# Patient Record
Sex: Female | Born: 1950 | Race: White | Hispanic: No | Marital: Married | Smoking: Former smoker
Health system: Southern US, Community
[De-identification: ages and names within clinical notes are randomized; demographics above are authoritative.]

## PROBLEM LIST (undated history)

## (undated) DIAGNOSIS — M8580 Other specified disorders of bone density and structure, unspecified site: Secondary | ICD-10-CM

## (undated) DIAGNOSIS — K7460 Unspecified cirrhosis of liver: Secondary | ICD-10-CM

## (undated) DIAGNOSIS — Z789 Other specified health status: Secondary | ICD-10-CM

## (undated) DIAGNOSIS — R7303 Prediabetes: Secondary | ICD-10-CM

## (undated) DIAGNOSIS — K219 Gastro-esophageal reflux disease without esophagitis: Secondary | ICD-10-CM

## (undated) DIAGNOSIS — Z780 Asymptomatic menopausal state: Secondary | ICD-10-CM

## (undated) DIAGNOSIS — L309 Dermatitis, unspecified: Secondary | ICD-10-CM

## (undated) DIAGNOSIS — M109 Gout, unspecified: Secondary | ICD-10-CM

## (undated) DIAGNOSIS — F1010 Alcohol abuse, uncomplicated: Secondary | ICD-10-CM

## (undated) DIAGNOSIS — I10 Essential (primary) hypertension: Secondary | ICD-10-CM

## (undated) DIAGNOSIS — G9341 Metabolic encephalopathy: Secondary | ICD-10-CM

## (undated) DIAGNOSIS — R1310 Dysphagia, unspecified: Secondary | ICD-10-CM

## (undated) DIAGNOSIS — E785 Hyperlipidemia, unspecified: Secondary | ICD-10-CM

## (undated) HISTORY — DX: Hyperlipidemia, unspecified: E78.5

## (undated) HISTORY — DX: Gout, unspecified: M10.9

## (undated) HISTORY — DX: Other specified disorders of bone density and structure, unspecified site: M85.80

## (undated) HISTORY — DX: Essential (primary) hypertension: I10

## (undated) HISTORY — DX: Dermatitis, unspecified: L30.9

## (undated) HISTORY — DX: Other specified health status: Z78.9

## (undated) HISTORY — DX: Asymptomatic menopausal state: Z78.0

## (undated) HISTORY — PX: ABDOMINAL HYSTERECTOMY: SHX81

## (undated) HISTORY — DX: Prediabetes: R73.03

## (undated) HISTORY — PX: ABDOMINAL SURGERY: SHX537

---

## 2011-01-04 ENCOUNTER — Other Ambulatory Visit: Payer: Self-pay | Admitting: Family Medicine

## 2011-01-04 DIAGNOSIS — R52 Pain, unspecified: Secondary | ICD-10-CM

## 2011-01-05 ENCOUNTER — Other Ambulatory Visit: Payer: Self-pay | Admitting: Family Medicine

## 2011-01-05 ENCOUNTER — Ambulatory Visit (HOSPITAL_COMMUNITY)
Admission: RE | Admit: 2011-01-05 | Discharge: 2011-01-05 | Disposition: A | Discharge status: Home / Self Care | Payer: BC Managed Care – PPO | Source: Ambulatory Visit | Attending: Family Medicine | Admitting: Family Medicine

## 2011-01-05 DIAGNOSIS — R935 Abnormal findings on diagnostic imaging of other abdominal regions, including retroperitoneum: Secondary | ICD-10-CM | POA: Insufficient documentation

## 2011-01-05 DIAGNOSIS — R52 Pain, unspecified: Secondary | ICD-10-CM

## 2011-01-05 DIAGNOSIS — R109 Unspecified abdominal pain: Secondary | ICD-10-CM | POA: Insufficient documentation

## 2011-01-05 DIAGNOSIS — K8689 Other specified diseases of pancreas: Secondary | ICD-10-CM

## 2011-01-08 ENCOUNTER — Ambulatory Visit (HOSPITAL_COMMUNITY)
Admission: RE | Admit: 2011-01-08 | Discharge: 2011-01-08 | Disposition: A | Discharge status: Home / Self Care | Payer: BC Managed Care – PPO | Source: Ambulatory Visit | Attending: Family Medicine | Admitting: Family Medicine

## 2011-01-08 DIAGNOSIS — K8689 Other specified diseases of pancreas: Secondary | ICD-10-CM

## 2011-01-08 DIAGNOSIS — R1032 Left lower quadrant pain: Secondary | ICD-10-CM | POA: Insufficient documentation

## 2011-01-08 DIAGNOSIS — K869 Disease of pancreas, unspecified: Secondary | ICD-10-CM | POA: Insufficient documentation

## 2011-01-08 MED ORDER — IOHEXOL 300 MG/ML  SOLN
100.0000 mL | Freq: Once | INTRAMUSCULAR | Status: AC | PRN
  Administered 2011-01-08: 100 mL via INTRAVENOUS

## 2012-08-16 ENCOUNTER — Ambulatory Visit (INDEPENDENT_AMBULATORY_CARE_PROVIDER_SITE_OTHER): Payer: BC Managed Care – PPO | Admitting: Family Medicine

## 2012-08-16 DIAGNOSIS — E538 Deficiency of other specified B group vitamins: Secondary | ICD-10-CM

## 2012-08-16 MED ORDER — CYANOCOBALAMIN 1000 MCG/ML IJ SOLN
1000.0000 ug | INTRAMUSCULAR | Status: DC
  Administered 2012-08-16 – 2014-02-25 (×18): 1000 ug via INTRAMUSCULAR

## 2012-08-16 NOTE — Progress Notes (Signed)
Patient ID: Maria Dunlap, female   DOB: 02-19-1951, 62 y.o.   MRN: [id number] Patient given b12 tolerated well

## 2012-08-22 ENCOUNTER — Telehealth: Payer: Self-pay | Admitting: Family Medicine

## 2012-08-22 NOTE — Telephone Encounter (Signed)
APPT MADE

## 2012-08-23 ENCOUNTER — Ambulatory Visit (INDEPENDENT_AMBULATORY_CARE_PROVIDER_SITE_OTHER): Payer: BC Managed Care – PPO | Admitting: Physician Assistant

## 2012-08-23 ENCOUNTER — Ambulatory Visit (INDEPENDENT_AMBULATORY_CARE_PROVIDER_SITE_OTHER): Payer: BC Managed Care – PPO

## 2012-08-23 ENCOUNTER — Encounter: Payer: Self-pay | Admitting: Physician Assistant

## 2012-08-23 VITALS — BP 138/90 | HR 73 | Temp 97.9°F | Wt 167.8 lb

## 2012-08-23 DIAGNOSIS — M5430 Sciatica, unspecified side: Secondary | ICD-10-CM

## 2012-08-23 DIAGNOSIS — M5432 Sciatica, left side: Secondary | ICD-10-CM

## 2012-08-23 MED ORDER — METHOCARBAMOL 750 MG PO TABS: 750.0000 mg | ORAL_TABLET | Freq: Four times a day (QID) | ORAL | Status: DC

## 2012-08-23 MED ORDER — HYDROCODONE-ACETAMINOPHEN 5-300 MG PO TABS: 5.0000 mg | ORAL_TABLET | Freq: Two times a day (BID) | ORAL | Status: DC | PRN

## 2012-08-23 NOTE — Progress Notes (Signed)
  Subjective:    Patient ID: Maria Dunlap, female    DOB: [birth year], 62 y.o.   MRN: [id number]  HPI 3 weeks low back left gluteal ache; no accident no injury    Review of Systems  Musculoskeletal: Positive for back pain and gait problem.  All other systems reviewed and are negative.       Objective:   Physical Exam  Vitals reviewed. Constitutional: She is oriented to person, place, and time. She appears well-developed and well-nourished.  Musculoskeletal:  Low back pain radiating to left gluteous  Neurological: She is alert and oriented to person, place, and time.  Skin: Skin is warm and dry.  Psychiatric: She has a normal mood and affect. Her behavior is normal. Judgment and thought content normal.   [facility] reading (PRIMARY) by  Dr. Caryn Bee, PA-C Loss of disc space L3-L4                                         Assessment & Plan:  Sciatica Scheduled Meds: Meds ordered this encounter  Medications  . lisinopril (PRINIVIL,ZESTRIL) 20 MG tablet    Sig: Take 20 mg by mouth daily.  Marland Kitchen estropipate (OGEN) 1.5 MG tablet    Sig: Take 1 mg by mouth daily.  Marland Kitchen ezetimibe-simvastatin (VYTORIN) 10-40 MG per tablet    Sig: Take 1 tablet by mouth at bedtime.  . fish oil-omega-3 fatty acids 1000 MG capsule    Sig: Take 4 g by mouth daily.  . methocarbamol (ROBAXIN-750) 750 MG tablet    Sig: Take 1 tablet (750 mg total) by mouth 4 (four) times daily.    Dispense:  30 tablet    Refill:  0    Order Specific Question:  Supervising Provider    Answer:  Ernestina Penna [[id number]]  . Hydrocodone-Acetaminophen 5-300 MG TABS    Sig: Take 5-300 mg by mouth 2 (two) times daily as needed.    Dispense:  60 each    Refill:  0    Order Specific Question:  Supervising Provider    Answer:  Ernestina Penna [[id number]]    Continuous Infusions:

## 2012-08-24 ENCOUNTER — Telehealth: Payer: Self-pay | Admitting: Physician Assistant

## 2012-08-24 NOTE — Telephone Encounter (Signed)
Pt aware of xray results

## 2012-09-11 ENCOUNTER — Telehealth: Payer: Self-pay | Admitting: Physician Assistant

## 2012-09-13 NOTE — Telephone Encounter (Signed)
No samples available.  Do you want to authorize referral to ortho or does she need to be seen again?

## 2012-09-15 ENCOUNTER — Other Ambulatory Visit: Payer: Self-pay | Admitting: Family Medicine

## 2012-09-15 DIAGNOSIS — M51379 Other intervertebral disc degeneration, lumbosacral region without mention of lumbar back pain or lower extremity pain: Secondary | ICD-10-CM

## 2012-09-15 DIAGNOSIS — M1990 Unspecified osteoarthritis, unspecified site: Secondary | ICD-10-CM

## 2012-09-15 DIAGNOSIS — M25559 Pain in unspecified hip: Secondary | ICD-10-CM

## 2012-09-15 DIAGNOSIS — M5137 Other intervertebral disc degeneration, lumbosacral region: Secondary | ICD-10-CM

## 2012-09-15 NOTE — Telephone Encounter (Signed)
refrral to ortho done in Epic already. Okay to give samples of Vytorin.

## 2012-09-15 NOTE — Telephone Encounter (Signed)
Pt aware no samples of vytorin and stated she had referral for othro and she .patient stated she would call us if needs referral

## 2012-09-18 ENCOUNTER — Ambulatory Visit (INDEPENDENT_AMBULATORY_CARE_PROVIDER_SITE_OTHER): Payer: BC Managed Care – PPO | Admitting: Family Medicine

## 2012-09-18 DIAGNOSIS — E538 Deficiency of other specified B group vitamins: Secondary | ICD-10-CM

## 2012-10-19 ENCOUNTER — Telehealth: Payer: Self-pay | Admitting: Family Medicine

## 2012-10-19 NOTE — Telephone Encounter (Signed)
No samples available patient aware

## 2012-10-23 ENCOUNTER — Encounter: Payer: Self-pay | Admitting: Family Medicine

## 2012-10-23 ENCOUNTER — Ambulatory Visit (INDEPENDENT_AMBULATORY_CARE_PROVIDER_SITE_OTHER): Payer: BC Managed Care – PPO | Admitting: Family Medicine

## 2012-10-23 VITALS — BP 130/81 | HR 70 | Temp 97.1°F | Wt 165.1 lb

## 2012-10-23 DIAGNOSIS — E785 Hyperlipidemia, unspecified: Secondary | ICD-10-CM | POA: Insufficient documentation

## 2012-10-23 DIAGNOSIS — E538 Deficiency of other specified B group vitamins: Secondary | ICD-10-CM | POA: Insufficient documentation

## 2012-10-23 DIAGNOSIS — I10 Essential (primary) hypertension: Secondary | ICD-10-CM | POA: Insufficient documentation

## 2012-10-23 DIAGNOSIS — Z79890 Hormone replacement therapy (postmenopausal): Secondary | ICD-10-CM | POA: Insufficient documentation

## 2012-10-23 LAB — COMPLETE METABOLIC PANEL WITH GFR
ALT: 15 U/L (ref 0–35)
AST: 16 U/L (ref 0–37)
Albumin: 3.8 g/dL (ref 3.5–5.2)
Alkaline Phosphatase: 60 U/L (ref 39–117)
BUN: 8 mg/dL (ref 6–23)
CO2: 25 mEq/L (ref 19–32)
Calcium: 9.1 mg/dL (ref 8.4–10.5)
Chloride: 103 mEq/L (ref 96–112)
Creat: 0.67 mg/dL (ref 0.50–1.10)
GFR, Est African American: >89 mL/min
GFR, Est Non African American: >89 mL/min
Glucose, Bld: 94 mg/dL (ref 70–99)
Potassium: 4.3 mEq/L (ref 3.5–5.3)
Sodium: 138 mEq/L (ref 135–145)
Total Bilirubin: 0.9 mg/dL (ref 0.3–1.2)
Total Protein: 6.6 g/dL (ref 6.0–8.3)

## 2012-10-23 LAB — VITAMIN B12: Vitamin B-12: >2000 pg/mL — ABNORMAL HIGH (ref 211–911)

## 2012-10-23 MED ORDER — CYANOCOBALAMIN 1000 MCG/ML IJ SOLN: 1000.0000 ug | Freq: Once | INTRAMUSCULAR | Status: DC

## 2012-10-23 NOTE — Progress Notes (Signed)
Patient ID: Analena Gama, female   DOB: Jul 25, 1950, 62 y.o.   MRN: [id number] SUBJECTIVE: HPI: Patient is here for follow up of hyperlipidemia/htn/B12 def/osteopenia: BP varies depending on the time of day denies Headache;denies Chest Pain;denies weakness;denies Shortness of Breath and orthopnea;denies Visual changes;denies palpitations;denies cough;denies pedal edema;denies symptoms of TIA or stroke;deniesClaudication symptoms. admits to Compliance with medications; denies Problems with medications.  HRT for 18+ years.  Breakfast: sometimes skips meals; but has a ham biscuit  Lunch: skips a lot of times; but will have a hamburger; sometimes subway Dinner: potatoes green beans corn with meat: chicken or tenderloin.  PMH/PSH: reviewed/updated in Epic  SH/FH: reviewed/updated in Epic works at [employer]  Allergies: reviewed/updated in [employer]  Medications: reviewed/updated in [employer]  Immunizations: reviewed/updated in Epic  ROS: As above in the HPI. All other systems are stable or negative.  OBJECTIVE: APPEARANCE:  Patient in no acute distress.The patient appeared well nourished and normally developed. Acyanotic. Waist:37 1/2 inches VITAL SIGNS:BP 130/81  Pulse 70  Temp(Src) 97.1 F (36.2 C) (Oral)  Wt 165 lb 1.6 oz (74.889 kg) Overweight Female  SKIN: warm and  Dry without overt rashes, tattoos and scars. Very tanned  HEAD and Neck: without JVD, Head and scalp: normal Eyes:No scleral icterus. Fundi normal, eye movements normal. Ears: Auricle normal, canal normal, Tympanic membranes normal, insufflation normal. Nose: normal Throat: normal Neck & thyroid: normal  CHEST & LUNGS: Chest wall: normal Lungs: Clear  CVS: Reveals the PMI to be normally located. Regular rhythm, First and Second Heart sounds are normal,  absence of murmurs, rubs or gallops. Peripheral vasculature: Radial pulses: normal Dorsal pedis pulses: normal Posterior pulses:  normal  ABDOMEN:  Appearance:obese Benign,, no organomegaly, no masses, no Abdominal Aortic enlargement. No Guarding , no rebound. No Bruits. Bowel sounds: normal  RECTAL: N/A GU: N/A  EXTREMETIES: nonedematous. Both Femoral and Pedal pulses are normal.  MUSCULOSKELETAL:  Spine: normal Joints: intact  NEUROLOGIC: oriented to time,place and person; nonfocal. Strength is normal Sensory is normal Reflexes are normal Cranial Nerves are normal.  ASSESSMENT: HTN (hypertension) - Plan: COMPLETE METABOLIC PANEL WITH GFR  HLD (hyperlipidemia) - Plan: COMPLETE METABOLIC PANEL WITH GFR, NMR Lipoprofile with Lipids  B12 deficiency - Plan: cyanocobalamin ((VITAMIN B-12)) injection 1,000 mcg, Vitamin B12  Post-menopause on HRT (hormone replacement therapy) - Plan: Ambulatory referral to Gynecology  PLAN: Orders Placed This Encounter  Procedures  . COMPLETE METABOLIC PANEL WITH GFR  . NMR Lipoprofile with Lipids  . Vitamin B12  . Ambulatory referral to Gynecology    Referral Priority:  Routine    Referral Type:  Consultation    Referral Reason:  Specialty Services Required    Requested Specialty:  Gynecology    Number of Visits Requested:  1   Meds ordered this encounter  Medications  . cyanocobalamin ((VITAMIN B-12)) injection 1,000 mcg    Sig:         Dr Woodroe Mode Recommendations  Diet and Exercise discussed with patient.  For nutrition information, I recommend books:  1).Eat to Live by Dr Monico Hoar. 2).Prevent and Reverse Heart Disease by Dr Suzzette Righter. 3) Dr Katherina Right Book: Reversing Diabetes  Exercise recommendations are:  If unable to walk, then the patient can exercise in a chair 3 times a day. By flapping arms like a bird gently and raising legs outwards to the front.  If ambulatory, the patient can go for walks for 30 minutes 3 times a week. Then increase the intensity and  duration as tolerated.  Goal is to try to attain exercise  frequency to 5 times a week.  If applicable: Best to perform resistance exercises (machines or weights) 2 days a week and cardio type exercises 3 days per week.  Discussed risks associated with long term HRT. Due for Pap this year.  Referral to GYn for Pap and for the GYN expertise on the HRT therapy  Return in about 3 months (around 01/23/2013) for Recheck medical problems.  Kenley Rettinger P. Modesto Charon, M.D.

## 2012-10-23 NOTE — Progress Notes (Signed)
toleratead b12 inj well

## 2012-10-23 NOTE — Patient Instructions (Addendum)
Cyanocobalamin, Vitamin B12 injection What is this medicine? CYANOCOBALAMIN (sye an oh koe BAL a min) is a man made form of vitamin B12. Vitamin B12 is used in the growth of healthy blood cells, nerve cells, and proteins in the body. It also helps with the metabolism of fats and carbohydrates. This medicine is used to treat people who can not absorb vitamin B12. This medicine may be used for other purposes; ask your health care provider or pharmacist if you have questions. What should I tell my health care provider before I take this medicine? They need to know if you have any of these conditions: -kidney disease -Leber's disease -megaloblastic anemia -an unusual or allergic reaction to cyanocobalamin, cobalt, other medicines, foods, dyes, or preservatives -pregnant or trying to get pregnant -breast-feeding How should I use this medicine? This medicine is injected into a muscle or deeply under the skin. It is usually given by a health care professional in a clinic or doctor's office. However, your doctor may teach you how to inject yourself. Follow all instructions. Talk to your pediatrician regarding the use of this medicine in children. Special care may be needed. Overdosage: If you think you have taken too much of this medicine contact a poison control center or emergency room at once. NOTE: This medicine is only for you. Do not share this medicine with others. What if I miss a dose? If you are given your dose at a clinic or doctor's office, call to reschedule your appointment. If you give your own injections and you miss a dose, take it as soon as you can. If it is almost time for your next dose, take only that dose. Do not take double or extra doses. What may interact with this medicine? -colchicine -heavy alcohol intake This list may not describe all possible interactions. Give your health care provider a list of all the medicines, herbs, non-prescription drugs, or dietary supplements you  use. Also tell them if you smoke, drink alcohol, or use illegal drugs. Some items may interact with your medicine. What should I watch for while using this medicine? Visit your doctor or health care professional regularly. You may need blood work done while you are taking this medicine. You may need to follow a special diet. Talk to your doctor. Limit your alcohol intake and avoid smoking to get the best benefit. What side effects may I notice from receiving this medicine? Side effects that you should report to your doctor or health care professional as soon as possible: -allergic reactions like skin rash, itching or hives, swelling of the face, lips, or tongue -blue tint to skin -chest tightness, pain -difficulty breathing, wheezing -dizziness -red, swollen painful area on the leg Side effects that usually do not require medical attention (report to your doctor or health care professional if they continue or are bothersome): -diarrhea -headache This list may not describe all possible side effects. Call your doctor for medical advice about side effects. You may report side effects to FDA at 1-800-FDA-1088. Where should I keep my medicine? Keep out of the reach of children. Store at room temperature between 15 and 30 degrees C (59 and 85 degrees F). Protect from light. Throw away any unused medicine after the expiration date. NOTE: This sheet is a summary. It may not cover all possible information. If you have questions about this medicine, talk to your doctor, pharmacist, or health care provider.  2012, Elsevier/Gold Standard. (08/21/2007 10:10:20 PM)       Dr  Maria Dunlap's Recommendations  Diet and Exercise discussed with patient.  For nutrition information, I recommend books:  1).Eat to Live by Dr Monico Hoar. 2).Prevent and Reverse Heart Disease by Dr Suzzette Righter. 3) Dr Katherina Right Book: Reversing Diabetes  Exercise recommendations are:  If unable to walk, then the  patient can exercise in a chair 3 times a day. By flapping arms like a bird gently and raising legs outwards to the front.  If ambulatory, the patient can go for walks for 30 minutes 3 times a week. Then increase the intensity and duration as tolerated.  Goal is to try to attain exercise frequency to 5 times a week.  If applicable: Best to perform resistance exercises (machines or weights) 2 days a week and cardio type exercises 3 days per week.

## 2012-10-24 LAB — NMR LIPOPROFILE WITH LIPIDS
Cholesterol, Total: 179 mg/dL (ref <200)
HDL Particle Number: 57.9 umol/L (ref >=30.5)
HDL Size: 9.2 nm (ref >=9.2)
HDL-C: 70 mg/dL (ref >=40)
LDL (calc): 84 mg/dL (ref <100)
LDL Particle Number: 1636 nmol/L — ABNORMAL HIGH (ref <1000)
LDL Size: 19.9 nm — ABNORMAL LOW (ref >20.5)
LP-IR Score: 50 — ABNORMAL HIGH (ref <=45)
Large HDL-P: 11.1 umol/L (ref >=4.8)
Large VLDL-P: 4.4 nmol/L — ABNORMAL HIGH (ref <=2.7)
Small LDL Particle Number: 1253 nmol/L — ABNORMAL HIGH (ref <=527)
Triglycerides: 125 mg/dL (ref <150)
VLDL Size: 48.7 nm — ABNORMAL HIGH (ref <=46.6)

## 2012-10-24 NOTE — Progress Notes (Signed)
Quick Note:  Lab result at goal.The LDLc and the LDLp is a bit high. However, the HDLc is very good. No change in Medications for now. No Change in plans and follow up. ______

## 2012-10-27 ENCOUNTER — Telehealth: Payer: Self-pay | Admitting: Family Medicine

## 2012-10-30 NOTE — Telephone Encounter (Signed)
Pt requesting vytorin samples--none available

## 2012-11-09 ENCOUNTER — Telehealth: Payer: Self-pay | Admitting: Family Medicine

## 2012-11-09 MED ORDER — EZETIMIBE-SIMVASTATIN 10-40 MG PO TABS: 1.0000 | ORAL_TABLET | Freq: Every day | ORAL | Status: DC

## 2012-11-09 NOTE — Telephone Encounter (Signed)
Pt aware samples at front desk 

## 2012-11-15 ENCOUNTER — Telehealth: Payer: Self-pay | Admitting: Family Medicine

## 2012-11-15 DIAGNOSIS — M549 Dorsalgia, unspecified: Secondary | ICD-10-CM

## 2012-11-15 NOTE — Telephone Encounter (Signed)
[facility]erral [facility] for [facility] pain?

## 2012-11-15 NOTE — Telephone Encounter (Signed)
Referral made 

## 2012-11-15 NOTE — Telephone Encounter (Signed)
Referral will contact pt with appt.

## 2012-11-15 NOTE — Telephone Encounter (Signed)
LM, referral being done,call back to that department for questions.

## 2012-11-16 ENCOUNTER — Other Ambulatory Visit: Payer: Self-pay

## 2012-11-16 MED ORDER — LISINOPRIL 20 MG PO TABS: 20.0000 mg | ORAL_TABLET | Freq: Every day | ORAL | Status: DC

## 2012-11-23 ENCOUNTER — Ambulatory Visit (INDEPENDENT_AMBULATORY_CARE_PROVIDER_SITE_OTHER): Payer: BC Managed Care – PPO | Admitting: Family Medicine

## 2012-11-23 DIAGNOSIS — E538 Deficiency of other specified B group vitamins: Secondary | ICD-10-CM

## 2012-11-27 ENCOUNTER — Encounter: Payer: Self-pay | Admitting: Obstetrics & Gynecology

## 2012-11-27 ENCOUNTER — Ambulatory Visit (INDEPENDENT_AMBULATORY_CARE_PROVIDER_SITE_OTHER): Payer: BC Managed Care – PPO | Admitting: Obstetrics & Gynecology

## 2012-11-27 ENCOUNTER — Ambulatory Visit: Payer: Self-pay

## 2012-11-27 VITALS — BP 147/91 | HR 67 | Resp 16 | Wt 171.0 lb

## 2012-11-27 DIAGNOSIS — Z01419 Encounter for gynecological examination (general) (routine) without abnormal findings: Secondary | ICD-10-CM | POA: Insufficient documentation

## 2012-11-27 DIAGNOSIS — Z79890 Hormone replacement therapy (postmenopausal): Secondary | ICD-10-CM

## 2012-11-27 NOTE — Patient Instructions (Signed)
Hormonal Therapy for Women, Frequently Asked Questions WHAT IS HORMONE THERAPY? Hormone therapy (HT), estrogen and progesterone, provides women with the female hormones that decrease and are lost as women get older. When the hormone estrogen is given alone, it is usually referred to as "ERT." When the hormone progesterone is combined with estrogen, it is generally called "HT." Previously this was known as hormone replacement therapy (HRT). Estrogen is a female hormone that brings about changes in various organs in the body. Progesterone is a female hormone that prepares the uterus for a pregnancy each month. During the change-over to menopause ("perimenopause") these hormone levels start to decrease. This causes many uncomfortable symptoms (see below). When the ovaries stop producing estrogen and progesterone, menstrual periods come to an end. At this point, the woman has experienced menopause. Menopause is complete when a woman misses 12 consecutive menstrual periods. WHAT ARE THE BENEFITS OF HORMONE THERAPY? Hormone therapy has been used to relieve the short-term symptoms of menopause. These include:  Hot flashes.  Depression.  Memory loss.  Correcting irregular menstrual periods.  Night sweats.  Tiredness.  Mood disturbances.  Thinning of scalp hair.  Disturbed sleep.  Vaginal dryness.  Painful intercourse.  Loss of breast tissue. Evidence shows that HT may be helpful in preventing colon cancer and bone loss (osteoporosis). WHAT ARE THE SHORT-TERM RISKS OF HORMONE THERAPY?  Some women report side effects from taking Hormone Therapy, including:  Feeling sick to stomach (nausea).  Fluid retention.  Swollen breasts.  Acne, when taking HT with progesterone.  Unusual vaginal discharge and bleeding (if the uterus is present).  Headaches.  Some women think HT will make them gain weight. Research now shows this is not true. Some women do gain weight during menopause, but this  is because their metabolism slows down as they age. They also may not be increasing their amount or level of physical activity as they get older.  Short-term benefits or side effects should become noticeable within days, weeks, or sometimes months after treatment begins. LONG-TERM RISKS These will not be easily noticeable for each individual woman. There are many factors involved that can contribute to long-term risks and side effects. CANCER There is concern that HT can increase the risk of some cancers, including endometrial cancer (lining of the uterus), breast, and certain (but not all) ovarian or cervix cancers, such as endometriod ovarian cancer.  When estrogen is taken alone, it raises the risk of endometrial cancer, if the uterus is still present. Adding progestin with estrogen (HT) can greatly reduce this risk. Progestin is added to prevent the overgrowth (hyperplasia) of cells in the uterine lining. Women who still have an intact uterus are generally given this combined therapy and should not take estrogen hormone alone without progesterone. HT with estrogen and progestin has been linked to an increased risk of invasive breast cancer. Women who use estrogen plus progestin for four years or longer are more likely to develop breast cancer than women who have not used them for as long. This indicates that the therapy may have a cumulative effect. The decision to take HT should be based on an overall look at the risk and benefits, and how they fit with your personal and genetic health profile. Conditions that increase the underlying risk of developing breast cancer include:  Family history of breast cancer.  Early age of the first menstrual period (menarche).  Late age of child bearing.  High fat diet.  Late menopause.  Obesity.  Increased breast density   on mammograms.  Certain non-cancerous (benign) breast lesions.  Excessive use of alcohol.  Extensive radiation exposure to the  chest. These factors need to be considered when deciding to take HT. If you are currently taking HT and have concerns, talk with your caregiver as soon as possible.  BREAST DENSITY Taking both estrogen and progestin also can affect a woman's breast density. Increased breast density from HT makes it hard for a radiologist to read some special breast x-rays (mammograms). This leads to the need for follow-up mammograms, ultrasound or MRI (magnetic resonance imaging), or taking breast tissue samples that are surgically removed (biopsies). Increased density also is a concern because studies have shown that women age 45 and older, whose mammograms show at least 75 percent dense tissue, are at increased risk for breast cancer. However, it is not known if increased breast density due to HT carries the same risk for breast cancer as having naturally dense breasts. About 25 percent of women who use combined HT have an increase in breast density on their mammograms. This is compared to about 8 percent of women taking estrogen alone. One study showed that stopping HT for about 2 weeks before having a mammogram improved the readability of the mammogram. But further research is needed to confirm the usefulness of this approach. HEART DISEASE In the past, taking HT (estrogen plus progestin) was thought to help protect women against heart disease. However, recent findings show that taking HT poses more risks than benefits. HT could increase a woman's risk for:  Heart disease.  Stroke.  Blood clot in the lung (pulmonary embolism).  Breast cancer.  Blood clots in the legs. Women who have gone through menopause should not be given HT to prevent heart disease and other chronic conditions.  Women who have gone through menopause and who have heart disease, may have a greater risk of another cardiac event (like heart attack) after starting HT, at least in the short-term. For women who have had strokes, their risk for  having another stroke goes up when they start taking HT. Hormones are not recommended for women with heart disease or for women who have had a stroke. If you have gone through menopause, talk with your caregiver about whether hormones are right for you. You can check the National Women's Health Information Center website (www.womenshealth.gov) for updates on postmenopausal hormone therapy. OTHER RISKS INCLUDE:  Developing high blood pressure.  Developing gallbladder disease.  Women with a fibroid non-cancerous tumor on the uterus may develop pain, bleeding or increase growth of the fibroid. If you are taking HT, watch for signs of trouble. These include:  Abnormal bleeding.  Breast lumps, bloody discharge or red/painful breasts.  Shortness of breath.  Dizziness.  Abdominal pain.  Severe headaches.  Pain in your calves or chest. Report these signs to your caregiver right away. Also, talk with your caregiver about how often you should have an exam. DOES THE DURATION OF TAKING HT AFFECT BREAST CANCER RISK? The relationship between a woman's risk of developing breast cancer and the length of time that she receives HT is not clear. Some women take HT for only a few years until the worst of their menopausal symptoms have passed. Others have taken it for 10 years or more. Some researchers believe that there is little or no increased risk of breast cancer associated with short-term use of either HT with estrogen alone or estrogen combined with progestin. But long-term use is linked to an increased risk. Women on   HT should continue to do monthly self breast exams and get their mammograms as recommended by their caregiver. WHY IS MENOPAUSAL HORMONE THERAPY USED IN SPITE OF THE CANCER RISK? The known benefits of HT can improve the quality of life for many women, by reducing uncomfortable symptoms, as mentioned above. There also is evidence that HT helps prevent and treats osteoporosis. There is  preliminary evidence that it can help prevent other problems associated with age, including colon cancer. The addition of progestin to the treatment has greatly reduced the risk of uterine cancer. ARE THERE OTHER DRUG THERAPIES KNOWN TO TREAT CONDITIONS RELATED TO MENOPAUSE? A class of antidepressant drugs called Selective Serotonin Reuptake Inhibitors (SSRIs) are effective in treating menopause-related symptoms of depression or mood changes. Vitamin E and Clonidine (drug typically used for high blood pressure) can help reduce hot flashes. To prevent osteoporosis, women who are at high risk for bone loss may be given drugs such as bisphosphonates, alendronate, raloxifene, calcium with vitamin D, calcitonin, and prescription medicines such as fasomax or boneva. Lastly, a class of cholesterol-lowering drugs called HMG-CoA-reductase inhibitors (statins) are proven to be effective for reducing risk of heart disease. They are also being explored to prevent osteoporosis. No alternatives to estrogen exist for prevention of colon cancer  a disease for which early evidence suggests HT may be beneficial. WHO SHOULD NOT USE HT?  HT is often not recommended for women who have any of the following conditions:  Vaginal bleeding of an unknown cause.  Suspected breast cancer or history of breast cancer.  History of endometrial or uterine cancer.  Chronic disease of the liver.  History of heart disease.  History of blood clots in the veins or legs or in the lung (venous thrombosis). This includes women who have had thrombosis or blood clots during pregnancy or when taking birth control pills. Although the risk of blood clots in women is very low, HT increases the risk.  Severe or uncontrolled high blood pressure.  Anyone who may be pregnant. HOW CAN I SORT THROUGH THE BENEFITS AND RISKS TO MAKE A GOOD DECISION ABOUT WHETHER OR NOT TO USE POSTMENOPAUSAL HORMONE THERAPY? Here are several helpful points,  summarizing the findings of the Women's Health Initiative (WHI) study:  First, it is important to know that because the study involved healthy women, only a small number of them had either a negative or positive effect from estrogen plus progestin therapy. The percentages describe what would happen to a whole population, not necessarily to any individual woman. Second, remember that percentages are not fate. Whether expressing risks or benefits, a percentage does not mean you will develop a disease. Many factors affect that likelihood, including:  Your lifestyle.  Environmental factors.  Heredity.  Your personal medical history. Realize that most treatments carry risks and benefits. No one can make a treatment choice for you. Talk with your caregiver and decide what is best for your health and quality of life. Begin by finding out your family history and your personal risk profile for:  Heart disease.  Stroke.  Breast cancer.  Osteoporosis.  Colorectal cancer.  Blood clots.  Other medical conditions. Document Released: 02/06/2003 Document Revised: 08/02/2011 Document Reviewed: 03/10/2009 [employer] Patient Information 2014 [employer].  

## 2012-11-27 NOTE — Progress Notes (Signed)
Patient ID: Maria Dunlap, female   DOB: Sep 01, 1950, 62 y.o.   MRN: [id number]  Chief Complaint  Patient presents with  . Gynecologic Exam    HPI Maria Dunlap is a 62 y.o. female.  G4P4, s/p TAH/BSO 1996. Last pap 1 year ago, yearly mammograms. On ERT for 18 years, has had VMS when she misses doses but is willing try to D/C.   HPI  Past Medical History  Diagnosis Date  . Hyperlipidemia   . Hypertension   . Osteopenia     Past Surgical History  Procedure Laterality Date  . Abdominal hysterectomy      History reviewed. No pertinent family history.  Social History History  Substance Use Topics  . Smoking status: Former Smoker    Quit date: 05/24/1998  . Smokeless tobacco: Not on file  . Alcohol Use: No    Allergies  Allergen Reactions  . Amoxicillin   . Crestor (Rosuvastatin)   . Mycinette (Phenol)     Current Outpatient Prescriptions  Medication Sig Dispense Refill  . estropipate (OGEN) 1.5 MG tablet Take 1 mg by mouth daily.      Marland Kitchen ezetimibe-simvastatin (VYTORIN) 10-40 MG per tablet Take 1 tablet by mouth at bedtime.  28 tablet  0  . fish oil-omega-3 fatty acids 1000 MG capsule Take 4 g by mouth daily.      . Hydrocodone-Acetaminophen 5-300 MG TABS Take 5-300 mg by mouth 2 (two) times daily as needed.  60 each  0  . lisinopril (PRINIVIL,ZESTRIL) 20 MG tablet Take 1 tablet (20 mg total) by mouth daily.  30 tablet  5  . methocarbamol (ROBAXIN-750) 750 MG tablet Take 1 tablet (750 mg total) by mouth 4 (four) times daily.  30 tablet  0   Current Facility-Administered Medications  Medication Dose Route Frequency Provider Last Rate Last Dose  . cyanocobalamin ((VITAMIN B-12)) injection 1,000 mcg  1,000 mcg Intramuscular Q30 days Ernestina Penna, MD   1,000 mcg at 11/23/12 1138  . cyanocobalamin ((VITAMIN B-12)) injection 1,000 mcg  1,000 mcg Intramuscular Once Ileana Ladd, MD        Review of Systems Review of Systems  Constitutional: Negative for fever,  fatigue and unexpected weight change.  Gastrointestinal: Negative for abdominal pain and abdominal distention.  Genitourinary: Negative for dysuria, vaginal discharge, pelvic pain and dyspareunia.  Psychiatric/Behavioral: Negative for sleep disturbance.    Blood pressure 147/91, pulse 67, resp. rate 16, weight 171 lb (77.565 kg).  Physical Exam Physical Exam  Constitutional: She is oriented to person, place, and time.  Pulmonary/Chest: Effort normal. No respiratory distress.  Breasts without masses or tenderness  Abdominal: Soft. She exhibits no distension and no mass. There is no tenderness.  Genitourinary: Vagina normal. No vaginal discharge found.  No mass or tenderness  Neurological: She is alert and oriented to person, place, and time.  Skin: Skin is warm and dry.  Psychiatric: She has a normal mood and affect. Her behavior is normal.    Data Reviewed   Assessment    Well woman exam after TAH       Plan    Hold ERT Yearly exams, mammograms No pap         Maria Dunlap 11/27/2012, 2:30 PM

## 2012-12-19 ENCOUNTER — Telehealth: Payer: Self-pay | Admitting: Family Medicine

## 2012-12-19 MED ORDER — EZETIMIBE-SIMVASTATIN 10-40 MG PO TABS: 1.0000 | ORAL_TABLET | Freq: Every day | ORAL | Status: DC

## 2012-12-19 NOTE — Telephone Encounter (Signed)
Pt aware samples at front desk 

## 2012-12-25 ENCOUNTER — Ambulatory Visit (INDEPENDENT_AMBULATORY_CARE_PROVIDER_SITE_OTHER): Payer: BC Managed Care – PPO | Admitting: *Deleted

## 2012-12-25 DIAGNOSIS — E538 Deficiency of other specified B group vitamins: Secondary | ICD-10-CM

## 2012-12-25 NOTE — Progress Notes (Signed)
Patient tolerated well.

## 2012-12-25 NOTE — Patient Instructions (Signed)
Vitamin B12 Injections Every person needs vitamin B12. A deficiency develops when the body does not get enough of it. One way to overcome this is by getting B12 shots (injections). A B12 shot puts the vitamin directly into muscle tissue. This avoids any problems your body might have in absorbing it from food or a pill. In some people, the body has trouble using the vitamin correctly. This can cause a B12 deficiency. Not consuming enough of the vitamin can also cause a deficiency. Getting enough vitamin B12 can be hard for elderly people. Sometimes, they do not eat a well-balanced diet. The elderly are also more likely than younger people to have medical conditions or take medications that can lead to a deficiency. WHAT DOES VITAMIN B12 DO? Vitamin B12 does many things to help the body work right:  It helps the body make healthy red blood cells.  It helps maintain nerve cells.  It is involved in the body's process of converting food into energy (metabolism).  It is needed to make the genetic material in all cells (DNA). VITAMIN B12 FOOD SOURCES Most people get plenty of vitamin B12 through the foods they eat. It is present in:  Meat, fish, poultry, and eggs.  Milk and milk products.  It also is added when certain foods are made, including some breads, cereals and yogurts. The food is then called "fortified". CAUSES The most common causes of vitamin B12 deficiency are:  Pernicious anemia. The condition develops when the body cannot make enough healthy red blood cells. This stems from a lack of a protein made in the stomach (intrinsic factor). People without this protein cannot absorb enough vitamin B12 from food.  Malabsorption. This is when the body cannot absorb the vitamin. It can be caused by:  Pernicious anemia.  Surgery to remove part or all of the stomach can lead to malabsorption. Removal of part or all of the small intestine can also cause malabsorption.  Vegetarian diet.  People who are strict about not eating foods from animals could have trouble taking in enough vitamin B12 from diet alone.  Medications. Some medicines have been linked to B12 deficiency, such as Metformin (a drug prescribed for type 2 diabetes). Long-term use of stomach acid suppressants also can keep the vitamin from being absorbed.  Intestinal problems such as inflammatory bowel disease. If there are problems in the digestive tract, vitamin B12 may not be absorbed in good enough amounts. SYMPTOMS People who do not get enough B12 can develop problems. These can include:  Anemia. This is when the body has too few red blood cells. Red blood cells carry oxygen to the rest of the body. Without a healthy supply of red blood cells, people can feel:  Tired (fatigued).  Weak.  Severe anemia can cause:  Shortness of breath.  Dizziness.  Rapid heart rate.  Paleness.  Other Vitamin B12 deficiency symptoms include:  Diarrhea.  Numbness or tingling in the hands or feet.  Loss of appetite.  Confusion.  Sores on the tongue or in the mouth. LET YOUR CAREGIVER KNOW ABOUT:  Any allergies. It is very important to know if you are allergic or sensitive to cobalt. Vitamin B12 contains cobalt.  Any history of kidney disease.  All medications you are taking. Include prescription and over-the-counter medicines, herbs and creams.  Whether you are pregnant or breast-feeding.  If you have Leber's disease, a hereditary eye condition, vitamin B12 could make it worse. RISKS AND COMPLICATIONS Reactions to an injection are   usually temporary. They might include:  Pain at the injection site.  Redness, swelling or tenderness at the site.  Headache, dizziness or weakness.  Nausea, upset stomach or diarrhea.  Numbness or tingling.  Fever.  Joint pain.  Itching or rash. If a reaction does not go away in a short while, talk with your healthcare provider. A change in the way the shots are  given, or where they are given, might need to be made. BEFORE AN INJECTION To decide whether B12 injections are right for you, your healthcare provider will probably:  Ask about your medical history.  Ask questions about your diet.  Ask about symptoms such as:  Have you felt weak?  Do you feel unusually tired?  Do you get dizzy?  Order blood tests. These may include a test to:  Check the level of red cells in your blood.  Measure B12 levels.  Check for the presence of intrinsic factor. VITAMIN B12 INJECTIONS How often you will need a vitamin B12 injection will depend on how severe your deficiency is. This also will affect how long you will need to get them. People with pernicious anemia usually get injections for their entire life. Others might get them for a shorter period. For many people, injections are given daily or weekly for several weeks. Then, once B12 levels are normal, injections are given just once a month. If the cause of the deficiency can be fixed, the injections can be stopped. Talk with your healthcare provider about what you should expect. For an injection:  The injection site will be cleaned with an alcohol swab.  Your healthcare provider will insert a needle directly into a muscle. Most any muscle can be used. Most often, an arm muscle is used. A buttocks muscle can also be used. Many people say shots in that area are less painful.  A small adhesive bandage may be put over the injection site. It usually can be taken off in an hour or less. Injections can be given by your healthcare provider. In some cases, family members give them. Sometimes, people give them to themselves. Talk with your healthcare provider about what would be best for you. If someone other than your healthcare provider will be giving the shots, the person will need to be trained to give them correctly. HOME CARE INSTRUCTIONS   You can remove the adhesive bandage within an hour of getting a  shot.  You should be able to go about your normal activities right away.  Avoid drinking large amounts of alcohol while taking vitamin B12 shots. Alcohol can interfere with the body's use of the vitamin. SEEK MEDICAL CARE IF:   Pain, redness, swelling or tenderness at the injection site does not get better or gets worse.  Headache, dizziness or weakness does not go away.  You develop a fever of more than 100.5 F (38.1 C). SEEK IMMEDIATE MEDICAL CARE IF:   You have chest pain.  You develop shortness of breath.  You have muscle weakness that gets worse.  You develop numbness, weakness or tingling on one side or one area of the body.  You have symptoms of an allergic reaction, such as:  Hives.  Difficulty breathing.  Swelling of the lips, face, tongue or throat.  You develop a fever of more than 102.0 F (38.9 C). MAKE SURE YOU:   Understand these instructions.  Will watch your condition.  Will get help right away if you are not doing well or get worse. Document   Released: 08/06/2008 Document Revised: 08/02/2011 Document Reviewed: 08/06/2008 [employer] Patient Information 2014 [employer].  

## 2012-12-27 ENCOUNTER — Telehealth: Payer: Self-pay | Admitting: Family Medicine

## 2012-12-27 NOTE — Telephone Encounter (Signed)
ERROR. SENT TO NURSE INSTEAD OF MED. REC.

## 2013-01-05 ENCOUNTER — Telehealth: Payer: Self-pay | Admitting: Family Medicine

## 2013-01-05 NOTE — Telephone Encounter (Signed)
States bp going up. appt Monday 8-18 14

## 2013-01-05 NOTE — Telephone Encounter (Signed)
Pt aware samples at front desk 

## 2013-01-08 ENCOUNTER — Encounter: Payer: Self-pay | Admitting: Family Medicine

## 2013-01-08 ENCOUNTER — Ambulatory Visit (INDEPENDENT_AMBULATORY_CARE_PROVIDER_SITE_OTHER): Payer: BC Managed Care – PPO | Admitting: Family Medicine

## 2013-01-08 VITALS — BP 146/95 | HR 81 | Temp 97.1°F | Ht 61.0 in | Wt 168.0 lb

## 2013-01-08 DIAGNOSIS — E538 Deficiency of other specified B group vitamins: Secondary | ICD-10-CM

## 2013-01-08 DIAGNOSIS — Z01419 Encounter for gynecological examination (general) (routine) without abnormal findings: Secondary | ICD-10-CM

## 2013-01-08 DIAGNOSIS — I10 Essential (primary) hypertension: Secondary | ICD-10-CM

## 2013-01-08 DIAGNOSIS — E785 Hyperlipidemia, unspecified: Secondary | ICD-10-CM

## 2013-01-08 DIAGNOSIS — N951 Menopausal and female climacteric states: Secondary | ICD-10-CM | POA: Insufficient documentation

## 2013-01-08 MED ORDER — LISINOPRIL 40 MG PO TABS: 40.0000 mg | ORAL_TABLET | Freq: Every day | ORAL | Status: DC

## 2013-01-08 NOTE — Progress Notes (Signed)
Patient ID: Maria Dunlap, female   DOB: Mar 17, 1951, 62 y.o.   MRN: [id number] SUBJECTIVE: CC: Chief Complaint  Patient presents with  . Follow-up    rck bp   HPI: Patient is here for follow up of hypertension: denies Headache;deniesChest Pain;denies weakness;denies Shortness of Breath or Orthopnea;denies Visual changes;denies palpitations;denies cough;denies pedal edema;denies symptoms of TIA or stroke; admits to Compliance with medications. denies Problems with medications.  Past Medical History  Diagnosis Date  . Hyperlipidemia   . Hypertension   . Osteopenia    Past Surgical History  Procedure Laterality Date  . Abdominal hysterectomy     History   Social History  . Marital Status: Married    Spouse Name: N/A    Number of Children: N/A  . Years of Education: N/A   Occupational History  . Not on file.   Social History Main Topics  . Smoking status: Former Smoker    Quit date: 05/24/1998  . Smokeless tobacco: Not on file  . Alcohol Use: No  . Drug Use: No  . Sexual Activity: Not on file   Other Topics Concern  . Not on file   Social History Narrative  . No narrative on file   History reviewed. No pertinent family history. Current Outpatient Prescriptions on File Prior to Visit  Medication Sig Dispense Refill  . ezetimibe-simvastatin (VYTORIN) 10-40 MG per tablet Take 1 tablet by mouth at bedtime.  28 tablet  0  . fish oil-omega-3 fatty acids 1000 MG capsule Take 4 g by mouth daily.      Marland Kitchen lisinopril (PRINIVIL,ZESTRIL) 20 MG tablet Take 1 tablet (20 mg total) by mouth daily.  30 tablet  5   Current Facility-Administered Medications on File Prior to Visit  Medication Dose Route Frequency Provider Last Rate Last Dose  . cyanocobalamin ((VITAMIN B-12)) injection 1,000 mcg  1,000 mcg Intramuscular Q30 days Ernestina Penna, MD   1,000 mcg at 12/25/12 0853  . cyanocobalamin ((VITAMIN B-12)) injection 1,000 mcg  1,000 mcg Intramuscular Once Ileana Ladd, MD        Allergies  Allergen Reactions  . Amoxicillin   . Crestor [Rosuvastatin]   . Mycinette [Phenol]    Immunization History  Administered Date(s) Administered  . Td 06/04/2009   Prior to Admission medications   Medication Sig Start Date End Date Taking? Authorizing Provider  ezetimibe-simvastatin (VYTORIN) 10-40 MG per tablet Take 1 tablet by mouth at bedtime. 12/19/12  Yes Ileana Ladd, MD  fish oil-omega-3 fatty acids 1000 MG capsule Take 4 g by mouth daily.   Yes Historical Provider, MD  lisinopril (PRINIVIL,ZESTRIL) 20 MG tablet Take 1 tablet (20 mg total) by mouth daily. 11/16/12  Yes Ileana Ladd, MD     ROS: As above in the HPI. All other systems are stable or negative.  OBJECTIVE: APPEARANCE:  Patient in no acute distress.The patient appeared well nourished and normally developed. Acyanotic. Waist: VITAL SIGNS:BP 146/95  Pulse 81  Temp(Src) 97.1 F (36.2 C) (Oral)  Ht 5\' 1"  (1.549 m)  Wt 168 lb (76.204 kg)  BMI 31.76 kg/m2 WF  SKIN: warm and  Dry without overt rashes, tattoos and scars  HEAD and Neck: without JVD, Head and scalp: normal Eyes:No scleral icterus. Fundi normal, eye movements normal. Ears: Auricle normal, canal normal, Tympanic membranes normal, insufflation normal. Nose: normal Throat: normal Neck & thyroid: normal  CHEST & LUNGS: Chest wall: normal Lungs: Clear  CVS: Reveals the PMI to be normally located. Regular  rhythm, First and Second Heart sounds are normal,  absence of murmurs, rubs or gallops. Peripheral vasculature: Radial pulses: normal Dorsal pedis pulses: normal Posterior pulses: normal  ABDOMEN:  Appearance: obese Benign, no organomegaly, no masses, no Abdominal Aortic enlargement. No Guarding , no rebound. No Bruits. Bowel sounds: normal  RECTAL: N/A GU: N/A  EXTREMETIES: nonedematous.  MUSCULOSKELETAL:  Spine: normal Joints: intact  NEUROLOGIC: oriented to time,place and person; nonfocal. Strength is  normal Sensory is normal Reflexes are normal Cranial Nerves are normal.  Results for orders placed in visit on 10/23/12  COMPLETE METABOLIC PANEL WITH GFR      Result Value Range   Sodium 138  135 - 145 mEq/L   Potassium 4.3  3.5 - 5.3 mEq/L   Chloride 103  96 - 112 mEq/L   CO2 25  19 - 32 mEq/L   Glucose, Bld 94  70 - 99 mg/dL   BUN 8  6 - 23 mg/dL   Creat 1.61  0.96 - 0.45 mg/dL   Total Bilirubin 0.9  0.3 - 1.2 mg/dL   Alkaline Phosphatase 60  39 - 117 U/L   AST 16  0 - 37 U/L   ALT 15  0 - 35 U/L   Total Protein 6.6  6.0 - 8.3 g/dL   Albumin 3.8  3.5 - 5.2 g/dL   Calcium 9.1  8.4 - 40.9 mg/dL   GFR, Est African American >89     GFR, Est Non African American >89    NMR LIPOPROFILE WITH LIPIDS      Result Value Range   LDL Particle Number [id number] (*) <1000 nmol/L   LDL (calc) 84  <100 mg/dL   HDL-C 70  >=81 mg/dL   Triglycerides 191  <478 mg/dL   Cholesterol, Total 295  <200 mg/dL   HDL Particle Number 62.1  >=30.8 umol/L   Large HDL-P 11.1  >=4.8 umol/L   Large VLDL-P 4.4 (*) <=2.7 nmol/L   Small LDL Particle Number [id number] (*) <=527 nmol/L   LDL Size 19.9 (*) >20.5 nm   HDL Size 9.2  >=9.2 nm   VLDL Size 48.7 (*) <=46.6 nm   LP-IR Score 50 (*) <=45  VITAMIN B12      Result Value Range   Vitamin B-12 >2000 (*) 211 - 911 pg/mL    ASSESSMENT: HTN (hypertension) - Plan: lisinopril (PRINIVIL,ZESTRIL) 40 MG tablet  Symptoms, such as flushing, sleeplessness, headache, lack of concentration, associated with the menopause  Routine gynecological examination  HLD (hyperlipidemia)  B12 deficiency   PLAN: Discussed treatment of menopause. Agree with the GYN assessment to discontinue the HRT due to risks. Recommend citalopram, patient declined. Wanted to use natural products, and therefore I recommended that she checks the [employer] products made in [place].  Meds ordered this encounter  Medications  . lisinopril (PRINIVIL,ZESTRIL) 40 MG tablet    Sig: Take 1  tablet (40 mg total) by mouth daily.    Dispense:  30 tablet    Refill:  5    Return in about 2 weeks (around 01/22/2013) for Recheck medical problems. And recheck BP  Rea Reser P. [place] Charon, M.D.

## 2013-01-23 ENCOUNTER — Encounter: Payer: Self-pay | Admitting: Family Medicine

## 2013-01-23 ENCOUNTER — Ambulatory Visit (INDEPENDENT_AMBULATORY_CARE_PROVIDER_SITE_OTHER): Payer: BC Managed Care – PPO | Admitting: Family Medicine

## 2013-01-23 VITALS — BP 131/81 | HR 87 | Temp 97.0°F | Ht 61.5 in | Wt 167.8 lb

## 2013-01-23 DIAGNOSIS — E538 Deficiency of other specified B group vitamins: Secondary | ICD-10-CM

## 2013-01-23 DIAGNOSIS — Z780 Asymptomatic menopausal state: Secondary | ICD-10-CM | POA: Insufficient documentation

## 2013-01-23 DIAGNOSIS — I10 Essential (primary) hypertension: Secondary | ICD-10-CM

## 2013-01-23 DIAGNOSIS — N951 Menopausal and female climacteric states: Secondary | ICD-10-CM

## 2013-01-23 DIAGNOSIS — E785 Hyperlipidemia, unspecified: Secondary | ICD-10-CM

## 2013-01-23 NOTE — Progress Notes (Signed)
Patient ID: Maria Dunlap, female   DOB: August 26, 1950, 62 y.o.   MRN: [id number] SUBJECTIVE: CC: Chief Complaint  Patient presents with  . Follow-up    2 wk follow up  reck bp  ? labs last drawn 10-23-12 needs refills on vytorin.     HPI: Jeffie Pollock OTC with Plant estrogen and Black Kohosh. Feels bloated. Patient is here for follow up of hypertension: denies Headache;deniesChest Pain;denies weakness;denies Shortness of Breath or Orthopnea;denies Visual changes;denies palpitations;denies cough;denies pedal edema;denies symptoms of TIA or stroke; admits to Compliance with medications. denies Problems with medications.  She will let us know where she needs the Vytorin refilled. Usually got samples.  Past Medical History  Diagnosis Date  . Hyperlipidemia   . Hypertension   . Osteopenia   . Menopause    Past Surgical History  Procedure Laterality Date  . Abdominal hysterectomy     History   Social History  . Marital Status: Married    Spouse Name: N/A    Number of Children: N/A  . Years of Education: N/A   Occupational History  . Not on file.   Social History Main Topics  . Smoking status: Former Smoker    Quit date: 05/24/1998  . Smokeless tobacco: Not on file  . Alcohol Use: No  . Drug Use: No  . Sexual Activity: Not on file   Other Topics Concern  . Not on file   Social History Narrative  . No narrative on file   No family history on file. Current Outpatient Prescriptions on File Prior to Visit  Medication Sig Dispense Refill  . ezetimibe-simvastatin (VYTORIN) 10-40 MG per tablet Take 1 tablet by mouth at bedtime.  28 tablet  0  . fish oil-omega-3 fatty acids 1000 MG capsule Take 4 g by mouth daily.      Marland Kitchen lisinopril (PRINIVIL,ZESTRIL) 40 MG tablet Take 1 tablet (40 mg total) by mouth daily.  30 tablet  5   Current Facility-Administered Medications on File Prior to Visit  Medication Dose Route Frequency Provider Last Rate Last Dose  . cyanocobalamin ((VITAMIN  B-12)) injection 1,000 mcg  1,000 mcg Intramuscular Q30 days Ernestina Penna, MD   1,000 mcg at 12/25/12 0853  . cyanocobalamin ((VITAMIN B-12)) injection 1,000 mcg  1,000 mcg Intramuscular Once Ileana Ladd, MD       Allergies  Allergen Reactions  . Amoxicillin   . Crestor [Rosuvastatin]   . Mycinette [Phenol]    Immunization History  Administered Date(s) Administered  . Td 06/04/2009   Prior to Admission medications   Medication Sig Start Date End Date Taking? Authorizing Provider  ezetimibe-simvastatin (VYTORIN) 10-40 MG per tablet Take 1 tablet by mouth at bedtime. 12/19/12  Yes Ileana Ladd, MD  fish oil-omega-3 fatty acids 1000 MG capsule Take 4 g by mouth daily.   Yes Historical Provider, MD  lisinopril (PRINIVIL,ZESTRIL) 40 MG tablet Take 1 tablet (40 mg total) by mouth daily. 01/08/13  Yes Ileana Ladd, MD     ROS: As above in the HPI. All other systems are stable or negative.  OBJECTIVE: APPEARANCE:  Patient in no acute distress.The patient appeared well nourished and normally developed. Acyanotic. Waist: VITAL SIGNS:BP 131/81  Pulse 87  Temp(Src) 97 F (36.1 C) (Oral)  Ht 5' 1.5" (1.562 m)  Wt 167 lb 12.8 oz (76.114 kg)  BMI 31.2 kg/m2  WF obese.   SKIN: warm and  Dry without overt rashes, tattoos and scars  HEAD and Neck: without  JVD, Head and scalp: normal Eyes:No scleral icterus. Fundi normal, eye movements normal. Ears: Auricle normal, canal normal, Tympanic membranes normal, insufflation normal. Nose: normal Throat: normal Neck & thyroid: normal  CHEST & LUNGS: Chest wall: normal Lungs: Clear  CVS: Reveals the PMI to be normally located. Regular rhythm, First and Second Heart sounds are normal,  absence of murmurs, rubs or gallops. Peripheral vasculature: Radial pulses: normal Dorsal pedis pulses: normal Posterior pulses: normal  ABDOMEN:  Appearance: obese Benign, no organomegaly, no masses, no Abdominal Aortic enlargement. No  Guarding , no rebound. No Bruits. Bowel sounds: normal  RECTAL: N/A GU: N/A  EXTREMETIES: nonedematous.  MUSCULOSKELETAL:  Spine: normal Joints: intact  NEUROLOGIC: oriented to time,place and person; nonfocal. Strength is normal Sensory is normal Reflexes are normal Cranial Nerves are normal.  Results for orders placed in visit on 10/23/12  COMPLETE METABOLIC PANEL WITH GFR      Result Value Range   Sodium 138  135 - 145 mEq/L   Potassium 4.3  3.5 - 5.3 mEq/L   Chloride 103  96 - 112 mEq/L   CO2 25  19 - 32 mEq/L   Glucose, Bld 94  70 - 99 mg/dL   BUN 8  6 - 23 mg/dL   Creat 1.61  0.96 - 0.45 mg/dL   Total Bilirubin 0.9  0.3 - 1.2 mg/dL   Alkaline Phosphatase 60  39 - 117 U/L   AST 16  0 - 37 U/L   ALT 15  0 - 35 U/L   Total Protein 6.6  6.0 - 8.3 g/dL   Albumin 3.8  3.5 - 5.2 g/dL   Calcium 9.1  8.4 - 40.9 mg/dL   GFR, Est African American >89     GFR, Est Non African American >89    NMR LIPOPROFILE WITH LIPIDS      Result Value Range   LDL Particle Number [id number] (*) <1000 nmol/L   LDL (calc) 84  <100 mg/dL   HDL-C 70  >=81 mg/dL   Triglycerides 191  <478 mg/dL   Cholesterol, Total 295  <200 mg/dL   HDL Particle Number 62.1  >=30.8 umol/L   Large HDL-P 11.1  >=4.8 umol/L   Large VLDL-P 4.4 (*) <=2.7 nmol/L   Small LDL Particle Number [id number] (*) <=527 nmol/L   LDL Size 19.9 (*) >20.5 nm   HDL Size 9.2  >=9.2 nm   VLDL Size 48.7 (*) <=46.6 nm   LP-IR Score 50 (*) <=45  VITAMIN B12      Result Value Range   Vitamin B-12 >2000 (*) 211 - 911 pg/mL    ASSESSMENT: B12 deficiency  HLD (hyperlipidemia) - Plan: CMP14+EGFR, NMR, lipoprofile  HTN (hypertension) - Plan: CMP14+EGFR  Menopause  Symptoms, such as flushing, sleeplessness, headache, lack of concentration, associated with the menopause   PLAN:  Orders Placed This Encounter  Procedures  . CMP14+EGFR  . NMR, lipoprofile    Meds ordered this encounter  Medications  . DISCONTD: estropipate (OGEN)  1.5 MG tablet    Sig:    Agree with the black Kohosh product.  Diet exercise.  offered again the citalopram: patient defers for now.       Dr Woodroe Mode Recommendations  For nutrition information, I recommend books:  1).Eat to Live by Dr Monico Hoar. 2).Prevent and Reverse Heart Disease by Dr Suzzette Righter. 3) Dr Katherina Right Book:  Program to Reverse Diabetes  Exercise recommendations are:  If unable to walk, then the  patient can exercise in a chair 3 times a day. By flapping arms like a bird gently and raising legs outwards to the front.  If ambulatory, the patient can go for walks for 30 minutes 3 times a week. Then increase the intensity and duration as tolerated.  Goal is to try to attain exercise frequency to 5 times a week.  If applicable: Best to perform resistance exercises (machines or weights) 2 days a week and cardio type exercises 3 days per week.  Return in about 3 months (around 04/24/2013) for Recheck medical problems.  Shahid Flori P. [place] Charon, M.D.

## 2013-01-23 NOTE — Patient Instructions (Addendum)
      Dr Jessyka Austria's Recommendations  For nutrition information, I recommend books:  1).Eat to Live by Dr Joel Fuhrman. 2).Prevent and Reverse Heart Disease by Dr Caldwell Esselstyn. 3) Dr Neal Barnard's Book:  Program to Reverse Diabetes  Exercise recommendations are:  If unable to walk, then the patient can exercise in a chair 3 times a day. By flapping arms like a bird gently and raising legs outwards to the front.  If ambulatory, the patient can go for walks for 30 minutes 3 times a week. Then increase the intensity and duration as tolerated.  Goal is to try to attain exercise frequency to 5 times a week.  If applicable: Best to perform resistance exercises (machines or weights) 2 days a week and cardio type exercises 3 days per week.  

## 2013-01-25 LAB — CMP14+EGFR
ALT: 27 IU/L (ref 0–32)
AST: 31 IU/L (ref 0–40)
Albumin/Globulin Ratio: 1.9 (ref 1.1–2.5)
Albumin: 4.3 g/dL (ref 3.6–4.8)
Alkaline Phosphatase: 76 IU/L (ref 39–117)
BUN/Creatinine Ratio: 15 (ref 11–26)
BUN: 11 mg/dL (ref 8–27)
CO2: 23 mmol/L (ref 18–29)
Calcium: 9 mg/dL (ref 8.6–10.2)
Chloride: 101 mmol/L (ref 97–108)
Creatinine, Ser: 0.74 mg/dL (ref 0.57–1.00)
GFR calc Af Amer: 99 mL/min/{1.73_m2} (ref >59)
GFR calc non Af Amer: 86 mL/min/{1.73_m2} (ref >59)
Globulin, Total: 2.3 g/dL (ref 1.5–4.5)
Glucose: 98 mg/dL (ref 65–99)
Potassium: 4.2 mmol/L (ref 3.5–5.2)
Sodium: 141 mmol/L (ref 134–144)
Total Bilirubin: 1.5 mg/dL — ABNORMAL HIGH (ref 0.0–1.2)
Total Protein: 6.6 g/dL (ref 6.0–8.5)

## 2013-01-25 LAB — NMR, LIPOPROFILE
Cholesterol: 176 mg/dL (ref <200)
HDL Cholesterol by NMR: 76 mg/dL (ref >=40)
HDL Particle Number: 56.5 umol/L (ref >=30.5)
LDL Particle Number: 1273 nmol/L — ABNORMAL HIGH (ref <1000)
LDL Size: 20.6 nm (ref >20.5)
LDLC SERPL CALC-MCNC: 62 mg/dL (ref <100)
LP-IR Score: 50 — ABNORMAL HIGH (ref <=45)
Small LDL Particle Number: 492 nmol/L (ref <=527)
Triglycerides by NMR: 189 mg/dL — ABNORMAL HIGH (ref <150)

## 2013-01-29 ENCOUNTER — Ambulatory Visit (INDEPENDENT_AMBULATORY_CARE_PROVIDER_SITE_OTHER): Payer: BC Managed Care – PPO | Admitting: *Deleted

## 2013-01-29 DIAGNOSIS — E538 Deficiency of other specified B group vitamins: Secondary | ICD-10-CM

## 2013-02-06 ENCOUNTER — Telehealth: Payer: Self-pay | Admitting: Family Medicine

## 2013-02-06 NOTE — Telephone Encounter (Signed)
Taken care of in result note  

## 2013-03-05 ENCOUNTER — Ambulatory Visit (INDEPENDENT_AMBULATORY_CARE_PROVIDER_SITE_OTHER): Payer: BC Managed Care – PPO | Admitting: *Deleted

## 2013-03-05 DIAGNOSIS — E538 Deficiency of other specified B group vitamins: Secondary | ICD-10-CM

## 2013-03-05 NOTE — Progress Notes (Signed)
Patient tolerated well.

## 2013-03-05 NOTE — Patient Instructions (Signed)
Vitamin B12 Injections Every person needs vitamin B12. A deficiency develops when the body does not get enough of it. One way to overcome this is by getting B12 shots (injections). A B12 shot puts the vitamin directly into muscle tissue. This avoids any problems your body might have in absorbing it from food or a pill. In some people, the body has trouble using the vitamin correctly. This can cause a B12 deficiency. Not consuming enough of the vitamin can also cause a deficiency. Getting enough vitamin B12 can be hard for elderly people. Sometimes, they do not eat a well-balanced diet. The elderly are also more likely than younger people to have medical conditions or take medications that can lead to a deficiency. WHAT DOES VITAMIN B12 DO? Vitamin B12 does many things to help the body work right:  It helps the body make healthy red blood cells.  It helps maintain nerve cells.  It is involved in the body's process of converting food into energy (metabolism).  It is needed to make the genetic material in all cells (DNA). VITAMIN B12 FOOD SOURCES Most people get plenty of vitamin B12 through the foods they eat. It is present in:  Meat, fish, poultry, and eggs.  Milk and milk products.  It also is added when certain foods are made, including some breads, cereals and yogurts. The food is then called "fortified". CAUSES The most common causes of vitamin B12 deficiency are:  Pernicious anemia. The condition develops when the body cannot make enough healthy red blood cells. This stems from a lack of a protein made in the stomach (intrinsic factor). People without this protein cannot absorb enough vitamin B12 from food.  Malabsorption. This is when the body cannot absorb the vitamin. It can be caused by:  Pernicious anemia.  Surgery to remove part or all of the stomach can lead to malabsorption. Removal of part or all of the small intestine can also cause malabsorption.  Vegetarian diet.  People who are strict about not eating foods from animals could have trouble taking in enough vitamin B12 from diet alone.  Medications. Some medicines have been linked to B12 deficiency, such as Metformin (a drug prescribed for type 2 diabetes). Long-term use of stomach acid suppressants also can keep the vitamin from being absorbed.  Intestinal problems such as inflammatory bowel disease. If there are problems in the digestive tract, vitamin B12 may not be absorbed in good enough amounts. SYMPTOMS People who do not get enough B12 can develop problems. These can include:  Anemia. This is when the body has too few red blood cells. Red blood cells carry oxygen to the rest of the body. Without a healthy supply of red blood cells, people can feel:  Tired (fatigued).  Weak.  Severe anemia can cause:  Shortness of breath.  Dizziness.  Rapid heart rate.  Paleness.  Other Vitamin B12 deficiency symptoms include:  Diarrhea.  Numbness or tingling in the hands or feet.  Loss of appetite.  Confusion.  Sores on the tongue or in the mouth. LET YOUR CAREGIVER KNOW ABOUT:  Any allergies. It is very important to know if you are allergic or sensitive to cobalt. Vitamin B12 contains cobalt.  Any history of kidney disease.  All medications you are taking. Include prescription and over-the-counter medicines, herbs and creams.  Whether you are pregnant or breast-feeding.  If you have Leber's disease, a hereditary eye condition, vitamin B12 could make it worse. RISKS AND COMPLICATIONS Reactions to an injection are   usually temporary. They might include:  Pain at the injection site.  Redness, swelling or tenderness at the site.  Headache, dizziness or weakness.  Nausea, upset stomach or diarrhea.  Numbness or tingling.  Fever.  Joint pain.  Itching or rash. If a reaction does not go away in a short while, talk with your healthcare provider. A change in the way the shots are  given, or where they are given, might need to be made. BEFORE AN INJECTION To decide whether B12 injections are right for you, your healthcare provider will probably:  Ask about your medical history.  Ask questions about your diet.  Ask about symptoms such as:  Have you felt weak?  Do you feel unusually tired?  Do you get dizzy?  Order blood tests. These may include a test to:  Check the level of red cells in your blood.  Measure B12 levels.  Check for the presence of intrinsic factor. VITAMIN B12 INJECTIONS How often you will need a vitamin B12 injection will depend on how severe your deficiency is. This also will affect how long you will need to get them. People with pernicious anemia usually get injections for their entire life. Others might get them for a shorter period. For many people, injections are given daily or weekly for several weeks. Then, once B12 levels are normal, injections are given just once a month. If the cause of the deficiency can be fixed, the injections can be stopped. Talk with your healthcare provider about what you should expect. For an injection:  The injection site will be cleaned with an alcohol swab.  Your healthcare provider will insert a needle directly into a muscle. Most any muscle can be used. Most often, an arm muscle is used. A buttocks muscle can also be used. Many people say shots in that area are less painful.  A small adhesive bandage may be put over the injection site. It usually can be taken off in an hour or less. Injections can be given by your healthcare provider. In some cases, family members give them. Sometimes, people give them to themselves. Talk with your healthcare provider about what would be best for you. If someone other than your healthcare provider will be giving the shots, the person will need to be trained to give them correctly. HOME CARE INSTRUCTIONS   You can remove the adhesive bandage within an hour of getting a  shot.  You should be able to go about your normal activities right away.  Avoid drinking large amounts of alcohol while taking vitamin B12 shots. Alcohol can interfere with the body's use of the vitamin. SEEK MEDICAL CARE IF:   Pain, redness, swelling or tenderness at the injection site does not get better or gets worse.  Headache, dizziness or weakness does not go away.  You develop a fever of more than 100.5 F (38.1 C). SEEK IMMEDIATE MEDICAL CARE IF:   You have chest pain.  You develop shortness of breath.  You have muscle weakness that gets worse.  You develop numbness, weakness or tingling on one side or one area of the body.  You have symptoms of an allergic reaction, such as:  Hives.  Difficulty breathing.  Swelling of the lips, face, tongue or throat.  You develop a fever of more than 102.0 F (38.9 C). MAKE SURE YOU:   Understand these instructions.  Will watch your condition.  Will get help right away if you are not doing well or get worse. Document   Released: 08/06/2008 Document Revised: 08/02/2011 Document Reviewed: 08/06/2008 [employer] Patient Information 2014 [employer].  

## 2013-03-08 ENCOUNTER — Telehealth: Payer: Self-pay | Admitting: Family Medicine

## 2013-03-08 NOTE — Telephone Encounter (Signed)
Pt notified  No samples a vailable

## 2013-03-08 NOTE — Telephone Encounter (Signed)
Pt was notified of labs from sept 2014

## 2013-03-14 ENCOUNTER — Telehealth: Payer: Self-pay | Admitting: Family Medicine

## 2013-03-14 NOTE — Telephone Encounter (Signed)
Pt notified no samples vytorin available

## 2013-03-15 MED ORDER — EZETIMIBE-SIMVASTATIN 10-40 MG PO TABS: 1.0000 | ORAL_TABLET | Freq: Every day | ORAL | Status: DC

## 2013-03-15 NOTE — Telephone Encounter (Signed)
vytorin rx sent to pharmacy

## 2013-03-20 ENCOUNTER — Telehealth: Payer: Self-pay | Admitting: Family Medicine

## 2013-03-20 MED ORDER — EZETIMIBE-SIMVASTATIN 10-40 MG PO TABS: 1.0000 | ORAL_TABLET | Freq: Every day | ORAL | Status: DC

## 2013-03-20 NOTE — Telephone Encounter (Signed)
Done

## 2013-03-23 ENCOUNTER — Telehealth: Payer: Self-pay | Admitting: Family Medicine

## 2013-04-06 ENCOUNTER — Ambulatory Visit (INDEPENDENT_AMBULATORY_CARE_PROVIDER_SITE_OTHER): Payer: BC Managed Care – PPO | Admitting: *Deleted

## 2013-04-06 DIAGNOSIS — E538 Deficiency of other specified B group vitamins: Secondary | ICD-10-CM

## 2013-04-06 NOTE — Progress Notes (Signed)
Vitamin b12 injection given and tolerated well.  

## 2013-04-06 NOTE — Patient Instructions (Signed)

## 2013-04-23 ENCOUNTER — Ambulatory Visit: Payer: BC Managed Care – PPO | Admitting: Family Medicine

## 2013-04-30 ENCOUNTER — Encounter: Payer: Self-pay | Admitting: Family Medicine

## 2013-04-30 ENCOUNTER — Telehealth: Payer: Self-pay | Admitting: Family Medicine

## 2013-04-30 ENCOUNTER — Ambulatory Visit (INDEPENDENT_AMBULATORY_CARE_PROVIDER_SITE_OTHER): Payer: BC Managed Care – PPO | Admitting: Family Medicine

## 2013-04-30 VITALS — BP 159/103 | HR 74 | Temp 97.0°F | Ht 61.0 in | Wt 168.6 lb

## 2013-04-30 DIAGNOSIS — R0989 Other specified symptoms and signs involving the circulatory and respiratory systems: Secondary | ICD-10-CM

## 2013-04-30 DIAGNOSIS — E538 Deficiency of other specified B group vitamins: Secondary | ICD-10-CM

## 2013-04-30 DIAGNOSIS — Z780 Asymptomatic menopausal state: Secondary | ICD-10-CM

## 2013-04-30 DIAGNOSIS — R09A2 Foreign body sensation, throat: Secondary | ICD-10-CM

## 2013-04-30 DIAGNOSIS — N951 Menopausal and female climacteric states: Secondary | ICD-10-CM

## 2013-04-30 DIAGNOSIS — F458 Other somatoform disorders: Secondary | ICD-10-CM

## 2013-04-30 DIAGNOSIS — I10 Essential (primary) hypertension: Secondary | ICD-10-CM

## 2013-04-30 DIAGNOSIS — E785 Hyperlipidemia, unspecified: Secondary | ICD-10-CM

## 2013-04-30 DIAGNOSIS — R198 Other specified symptoms and signs involving the digestive system and abdomen: Secondary | ICD-10-CM

## 2013-04-30 MED ORDER — CLONIDINE HCL 0.1 MG PO TABS: 0.1000 mg | ORAL_TABLET | Freq: Two times a day (BID) | ORAL | Status: DC

## 2013-04-30 NOTE — Progress Notes (Signed)
Patient ID: Maria Dunlap, female   DOB: 05/15/1951, 62 y.o.   MRN: [id number] SUBJECTIVE: CC: Chief Complaint  Patient presents with  . Follow-up    55month follow up c/o     HPI: Patient is here for follow up of hyperlipidemia/HTN/Menopause denies Headache;denies Chest Pain;denies weakness;denies Shortness of Breath and orthopnea;denies Visual changes;denies palpitations;denies cough;denies pedal edema;denies symptoms of TIA or stroke;deniesClaudication symptoms. admits to Compliance with medications; denies Problems with medications.  Has symptoms in the neck feels like her thyroid is full and a sensation like it is pushing on her trachea.    Past Medical History  Diagnosis Date  . Hyperlipidemia   . Hypertension   . Osteopenia   . Menopause    Past Surgical History  Procedure Laterality Date  . Abdominal hysterectomy     History   Social History  . Marital Status: Married    Spouse Name: N/A    Number of Children: N/A  . Years of Education: N/A   Occupational History  . Not on file.   Social History Main Topics  . Smoking status: Former Smoker    Quit date: 05/24/1998  . Smokeless tobacco: Not on file  . Alcohol Use: No  . Drug Use: No  . Sexual Activity: Not on file   Other Topics Concern  . Not on file   Social History Narrative  . No narrative on file   No family history on file. Current Outpatient Prescriptions on File Prior to Visit  Medication Sig Dispense Refill  . ezetimibe-simvastatin (VYTORIN) 10-40 MG per tablet Take 1 tablet by mouth at bedtime.  30 tablet  3  . fish oil-omega-3 fatty acids 1000 MG capsule Take 4 g by mouth daily.      Marland Kitchen lisinopril (PRINIVIL,ZESTRIL) 40 MG tablet Take 1 tablet (40 mg total) by mouth daily.  30 tablet  5   Current Facility-Administered Medications on File Prior to Visit  Medication Dose Route Frequency Provider Last Rate Last Dose  . cyanocobalamin ((VITAMIN B-12)) injection 1,000 mcg  1,000 mcg  Intramuscular Q30 days Ernestina Penna, MD   1,000 mcg at 04/06/13 1138  . cyanocobalamin ((VITAMIN B-12)) injection 1,000 mcg  1,000 mcg Intramuscular Once Ileana Ladd, MD       Allergies  Allergen Reactions  . Amoxicillin   . Crestor [Rosuvastatin]   . Mycinette [Phenol]    Immunization History  Administered Date(s) Administered  . Td 06/04/2009   Prior to Admission medications   Medication Sig Start Date End Date Taking? Authorizing Provider  ezetimibe-simvastatin (VYTORIN) 10-40 MG per tablet Take 1 tablet by mouth at bedtime. 03/20/13  Yes Ileana Ladd, MD  fish oil-omega-3 fatty acids 1000 MG capsule Take 4 g by mouth daily.   Yes Historical Provider, MD  lisinopril (PRINIVIL,ZESTRIL) 40 MG tablet Take 1 tablet (40 mg total) by mouth daily. 01/08/13  Yes Ileana Ladd, MD  Nutritional Supplements (ESTROVEN PO) Take by mouth. OTC   Yes Historical Provider, MD     ROS: As above in the HPI. All other systems are stable or negative.  OBJECTIVE: APPEARANCE:  Patient in no acute distress.The patient appeared well nourished and normally developed. Acyanotic. Waist: VITAL SIGNS:BP 159/103  Pulse 74  Temp(Src) 97 F (36.1 C) (Oral)  Ht 5\' 1"  (1.549 m)  Wt 168 lb 9.6 oz (76.476 kg)  BMI 31.87 kg/m2   SKIN: warm and  Dry without overt rashes, tattoos and scars  HEAD and Neck: without  JVD, Head and scalp: normal Eyes:No scleral icterus. Fundi normal, eye movements normal. Ears: Auricle normal, canal normal, Tympanic membranes normal, insufflation normal. Nose: normal Throat: normal Neck & thyroid: normal The neck is  Short and thick but no masses or thyroid enlargement is detected.  CHEST & LUNGS: Chest wall: normal Lungs: Clear  CVS: Reveals the PMI to be normally located. Regular rhythm, First and Second Heart sounds are normal,  absence of murmurs, rubs or gallops. Peripheral vasculature: Radial pulses: normal Dorsal pedis pulses: normal Posterior pulses:  normal  ABDOMEN:  Appearance: normal Benign, no organomegaly, no masses, no Abdominal Aortic enlargement. No Guarding , no rebound. No Bruits. Bowel sounds: normal  RECTAL: N/A GU: N/A  EXTREMETIES: nonedematous.  MUSCULOSKELETAL:  Spine: normal Joints: intact  NEUROLOGIC: oriented to time,place and person; nonfocal. Strength is normal Sensory is normal Reflexes are normal Cranial Nerves are normal.   Results for orders placed in visit on 01/23/13  CMP14+EGFR      Result Value Range   Glucose 98  65 - 99 mg/dL   BUN 11  8 - 27 mg/dL   Creatinine, Ser 1.61  0.57 - 1.00 mg/dL   GFR calc non Af Amer 86  >59 mL/min/1.73   GFR calc Af Amer 99  >59 mL/min/1.73   BUN/Creatinine Ratio 15  11 - 26   Sodium 141  134 - 144 mmol/L   Potassium 4.2  3.5 - 5.2 mmol/L   Chloride 101  97 - 108 mmol/L   CO2 23  18 - 29 mmol/L   Calcium 9.0  8.6 - 10.2 mg/dL   Total Protein 6.6  6.0 - 8.5 g/dL   Albumin 4.3  3.6 - 4.8 g/dL   Globulin, Total 2.3  1.5 - 4.5 g/dL   Albumin/Globulin Ratio 1.9  1.1 - 2.5   Total Bilirubin 1.5 (*) 0.0 - 1.2 mg/dL   Alkaline Phosphatase 76  39 - 117 IU/L   AST 31  0 - 40 IU/L   ALT 27  0 - 32 IU/L  NMR, LIPOPROFILE      Result Value Range   LDL Particle Number [id number] (*) <1000 nmol/L   LDLC SERPL CALC-MCNC 62  <100 mg/dL   HDL Cholesterol by NMR 76  >=40 mg/dL   Triglycerides by NMR 189 (*) <150 mg/dL   Cholesterol 096  <045 mg/dL   HDL Particle Number [id number]  >=81.1 umol/L   Small LDL Particle Number 492  <=527 nmol/L   LDL Size 20.6  >20.5 nm   LP-IR Score 50 (*) <=45    ASSESSMENT:  HLD (hyperlipidemia) - Plan: CMP14+EGFR, Lipid panel  HTN (hypertension) - Plan: CMP14+EGFR, cloNIDine (CATAPRES) 0.1 MG tablet  Menopause  B12 deficiency - Plan: Vitamin B12  Symptoms, such as flushing, sleeplessness, headache, lack of concentration, associated with the menopause  Globus sensation - Plan: Thyroid Panel With TSH, US Soft Tissue  Head/Neck  PLAN:       Dr Woodroe Mode Recommendations  For nutrition information, I recommend books:  1).Eat to Live by Dr Monico Hoar. 2).Prevent and Reverse Heart Disease by Dr Suzzette Righter. 3) Dr Katherina Right Book:  Program to Reverse Diabetes  Exercise recommendations are:  If unable to walk, then the patient can exercise in a chair 3 times a day. By flapping arms like a bird gently and raising legs outwards to the front.  If ambulatory, the patient can go for walks for 30 minutes 3 times a week. Then increase  the intensity and duration as tolerated.  Goal is to try to attain exercise frequency to 5 times a week.  If applicable: Best to perform resistance exercises (machines or weights) 2 days a week and cardio type exercises 3 days per week.  Low salt diet.  Discuss approaches to hot flashes.  Consider citalopram but patient denies.  She plans to see a [facility] provider in [place] who Rx herbals for treatment of menopause.    Orders Placed This Encounter  Procedures  . US Soft Tissue Head/Neck    Standing Status: Future     Number of Occurrences:      Standing Expiration Date: 07/01/2014    Scheduling Instructions:     Globus feeling like her thyroid is  Swollen and pressing on her trachea.     Exam benign    Order Specific Question:  Reason for Exam (SYMPTOM  OR DIAGNOSIS REQUIRED)    Answer:  feels  afullness in the neck like thyroid is swollen    Order Specific Question:  Preferred imaging location?    Answer:  [facility]  . CMP14+EGFR  . Lipid panel  . Vitamin B12  . Thyroid Panel With TSH   Meds ordered this encounter  Medications  . Nutritional Supplements (ESTROVEN PO)    Sig: Take by mouth. OTC  . cloNIDine (CATAPRES) 0.1 MG tablet    Sig: Take 1 tablet (0.1 mg total) by mouth 2 (two) times daily.    Dispense:  60 tablet    Refill:  5   There are no discontinued medications. Return in about 6 weeks (around 06/11/2013) for  recheck BP.  Paytan Recine P. Modesto Charon, M.D.

## 2013-04-30 NOTE — Patient Instructions (Signed)
      Dr Tineka Uriegas's Recommendations  For nutrition information, I recommend books:  1).Eat to Live by Dr Joel Fuhrman. 2).Prevent and Reverse Heart Disease by Dr Caldwell Esselstyn. 3) Dr Neal Barnard's Book:  Program to Reverse Diabetes  Exercise recommendations are:  If unable to walk, then the patient can exercise in a chair 3 times a day. By flapping arms like a bird gently and raising legs outwards to the front.  If ambulatory, the patient can go for walks for 30 minutes 3 times a week. Then increase the intensity and duration as tolerated.  Goal is to try to attain exercise frequency to 5 times a week.  If applicable: Best to perform resistance exercises (machines or weights) 2 days a week and cardio type exercises 3 days per week.  

## 2013-04-30 NOTE — Telephone Encounter (Signed)
Pt notified with information about clondine

## 2013-05-01 LAB — CMP14+EGFR
ALT: 25 IU/L (ref 0–32)
AST: 21 IU/L (ref 0–40)
Albumin/Globulin Ratio: 2 (ref 1.1–2.5)
Albumin: 4.3 g/dL (ref 3.6–4.8)
Alkaline Phosphatase: 77 IU/L (ref 39–117)
BUN/Creatinine Ratio: 16 (ref 11–26)
BUN: 11 mg/dL (ref 8–27)
CO2: 23 mmol/L (ref 18–29)
Calcium: 9.7 mg/dL (ref 8.6–10.2)
Chloride: 102 mmol/L (ref 97–108)
Creatinine, Ser: 0.7 mg/dL (ref 0.57–1.00)
GFR calc Af Amer: 106 mL/min/{1.73_m2} (ref >59)
GFR calc non Af Amer: 92 mL/min/{1.73_m2} (ref >59)
Globulin, Total: 2.2 g/dL (ref 1.5–4.5)
Glucose: 113 mg/dL — ABNORMAL HIGH (ref 65–99)
Potassium: 4.4 mmol/L (ref 3.5–5.2)
Sodium: 141 mmol/L (ref 134–144)
Total Bilirubin: 0.9 mg/dL (ref 0.0–1.2)
Total Protein: 6.5 g/dL (ref 6.0–8.5)

## 2013-05-01 LAB — LIPID PANEL
Chol/HDL Ratio: 2.2 ratio units (ref 0.0–4.4)
Cholesterol, Total: 190 mg/dL (ref 100–199)
HDL: 85 mg/dL (ref >39)
LDL Calculated: 81 mg/dL (ref 0–99)
Triglycerides: 121 mg/dL (ref 0–149)
VLDL Cholesterol Cal: 24 mg/dL (ref 5–40)

## 2013-05-01 LAB — THYROID PANEL WITH TSH
Free Thyroxine Index: 1.9 (ref 1.2–4.9)
T3 Uptake Ratio: 30 % (ref 24–39)
T4, Total: 6.4 ug/dL (ref 4.5–12.0)
TSH: 2.28 u[IU]/mL (ref 0.450–4.500)

## 2013-05-01 LAB — VITAMIN B12: Vitamin B-12: 361 pg/mL (ref 211–946)

## 2013-05-04 ENCOUNTER — Ambulatory Visit (HOSPITAL_COMMUNITY): Payer: BC Managed Care – PPO

## 2013-05-07 ENCOUNTER — Other Ambulatory Visit (HOSPITAL_COMMUNITY): Payer: BC Managed Care – PPO

## 2013-05-07 ENCOUNTER — Telehealth: Payer: Self-pay | Admitting: *Deleted

## 2013-05-07 ENCOUNTER — Ambulatory Visit (INDEPENDENT_AMBULATORY_CARE_PROVIDER_SITE_OTHER): Payer: BC Managed Care – PPO | Admitting: *Deleted

## 2013-05-07 DIAGNOSIS — E538 Deficiency of other specified B group vitamins: Secondary | ICD-10-CM

## 2013-05-07 NOTE — Telephone Encounter (Signed)
Pt informed of lab results Verbalizes understanding Copy of labs given to pt

## 2013-05-07 NOTE — Progress Notes (Signed)
Pt informed of lab results Verbalizes understanding Copy given to pt

## 2013-05-14 ENCOUNTER — Ambulatory Visit (HOSPITAL_COMMUNITY)
Admission: RE | Admit: 2013-05-14 | Discharge: 2013-05-14 | Disposition: A | Discharge status: Home / Self Care | Payer: BC Managed Care – PPO | Source: Ambulatory Visit | Attending: Family Medicine | Admitting: Family Medicine

## 2013-05-14 DIAGNOSIS — R0989 Other specified symptoms and signs involving the circulatory and respiratory systems: Secondary | ICD-10-CM

## 2013-05-14 DIAGNOSIS — R198 Other specified symptoms and signs involving the digestive system and abdomen: Secondary | ICD-10-CM

## 2013-05-14 DIAGNOSIS — R6889 Other general symptoms and signs: Secondary | ICD-10-CM | POA: Insufficient documentation

## 2013-05-23 ENCOUNTER — Telehealth: Payer: Self-pay | Admitting: Family Medicine

## 2013-05-23 NOTE — Telephone Encounter (Signed)
Pt notifed -- no samples available for vytorin

## 2013-05-25 ENCOUNTER — Telehealth: Payer: Self-pay

## 2013-05-25 NOTE — Telephone Encounter (Signed)
[facility] but [facility] not [facility] . Says it will be 60.00 and she wants this on prior auth. [facility] would need to call her insurance or change med. Pt states she spoke to someone in the insurace dept was helping her and thought the person was "Bjorn Loserhonda"  [facility]

## 2013-06-08 ENCOUNTER — Ambulatory Visit (INDEPENDENT_AMBULATORY_CARE_PROVIDER_SITE_OTHER): Payer: BC Managed Care – PPO | Admitting: *Deleted

## 2013-06-08 DIAGNOSIS — E538 Deficiency of other specified B group vitamins: Secondary | ICD-10-CM

## 2013-06-08 NOTE — Progress Notes (Signed)
Vitamin b12 injection given and tolerated well.  

## 2013-06-08 NOTE — Patient Instructions (Signed)
Vitamin B12 Injections Every person needs vitamin B12. A deficiency develops when the body does not get enough of it. One way to overcome this is by getting B12 shots (injections). A B12 shot puts the vitamin directly into muscle tissue. This avoids any problems your body might have in absorbing it from food or a pill. In some people, the body has trouble using the vitamin correctly. This can cause a B12 deficiency. Not consuming enough of the vitamin can also cause a deficiency. Getting enough vitamin B12 can be hard for elderly people. Sometimes, they do not eat a well-balanced diet. The elderly are also more likely than younger people to have medical conditions or take medications that can lead to a deficiency. WHAT DOES VITAMIN B12 DO? Vitamin B12 does many things to help the body work right:  It helps the body make healthy red blood cells.  It helps maintain nerve cells.  It is involved in the body's process of converting food into energy (metabolism).  It is needed to make the genetic material in all cells (DNA). VITAMIN B12 FOOD SOURCES Most people get plenty of vitamin B12 through the foods they eat. It is present in:  Meat, fish, poultry, and eggs.  Milk and milk products.  It also is added when certain foods are made, including some breads, cereals and yogurts. The food is then called "fortified". CAUSES The most common causes of vitamin B12 deficiency are:  Pernicious anemia. The condition develops when the body cannot make enough healthy red blood cells. This stems from a lack of a protein made in the stomach (intrinsic factor). People without this protein cannot absorb enough vitamin B12 from food.  Malabsorption. This is when the body cannot absorb the vitamin. It can be caused by:  Pernicious anemia.  Surgery to remove part or all of the stomach can lead to malabsorption. Removal of part or all of the small intestine can also cause malabsorption.  Vegetarian diet.  People who are strict about not eating foods from animals could have trouble taking in enough vitamin B12 from diet alone.  Medications. Some medicines have been linked to B12 deficiency, such as Metformin (a drug prescribed for type 2 diabetes). Long-term use of stomach acid suppressants also can keep the vitamin from being absorbed.  Intestinal problems such as inflammatory bowel disease. If there are problems in the digestive tract, vitamin B12 may not be absorbed in good enough amounts. SYMPTOMS People who do not get enough B12 can develop problems. These can include:  Anemia. This is when the body has too few red blood cells. Red blood cells carry oxygen to the rest of the body. Without a healthy supply of red blood cells, people can feel:  Tired (fatigued).  Weak.  Severe anemia can cause:  Shortness of breath.  Dizziness.  Rapid heart rate.  Paleness.  Other Vitamin B12 deficiency symptoms include:  Diarrhea.  Numbness or tingling in the hands or feet.  Loss of appetite.  Confusion.  Sores on the tongue or in the mouth. LET YOUR CAREGIVER KNOW ABOUT:  Any allergies. It is very important to know if you are allergic or sensitive to cobalt. Vitamin B12 contains cobalt.  Any history of kidney disease.  All medications you are taking. Include prescription and over-the-counter medicines, herbs and creams.  Whether you are pregnant or breast-feeding.  If you have Leber's disease, a hereditary eye condition, vitamin B12 could make it worse. RISKS AND COMPLICATIONS Reactions to an injection are   usually temporary. They might include:  Pain at the injection site.  Redness, swelling or tenderness at the site.  Headache, dizziness or weakness.  Nausea, upset stomach or diarrhea.  Numbness or tingling.  Fever.  Joint pain.  Itching or rash. If a reaction does not go away in a short while, talk with your healthcare provider. A change in the way the shots are  given, or where they are given, might need to be made. BEFORE AN INJECTION To decide whether B12 injections are right for you, your healthcare provider will probably:  Ask about your medical history.  Ask questions about your diet.  Ask about symptoms such as:  Have you felt weak?  Do you feel unusually tired?  Do you get dizzy?  Order blood tests. These may include a test to:  Check the level of red cells in your blood.  Measure B12 levels.  Check for the presence of intrinsic factor. VITAMIN B12 INJECTIONS How often you will need a vitamin B12 injection will depend on how severe your deficiency is. This also will affect how long you will need to get them. People with pernicious anemia usually get injections for their entire life. Others might get them for a shorter period. For many people, injections are given daily or weekly for several weeks. Then, once B12 levels are normal, injections are given just once a month. If the cause of the deficiency can be fixed, the injections can be stopped. Talk with your healthcare provider about what you should expect. For an injection:  The injection site will be cleaned with an alcohol swab.  Your healthcare provider will insert a needle directly into a muscle. Most any muscle can be used. Most often, an arm muscle is used. A buttocks muscle can also be used. Many people say shots in that area are less painful.  A small adhesive bandage may be put over the injection site. It usually can be taken off in an hour or less. Injections can be given by your healthcare provider. In some cases, family members give them. Sometimes, people give them to themselves. Talk with your healthcare provider about what would be best for you. If someone other than your healthcare provider will be giving the shots, the person will need to be trained to give them correctly. HOME CARE INSTRUCTIONS   You can remove the adhesive bandage within an hour of getting a  shot.  You should be able to go about your normal activities right away.  Avoid drinking large amounts of alcohol while taking vitamin B12 shots. Alcohol can interfere with the body's use of the vitamin. SEEK MEDICAL CARE IF:   Pain, redness, swelling or tenderness at the injection site does not get better or gets worse.  Headache, dizziness or weakness does not go away.  You develop a fever of more than 100.5 F (38.1 C). SEEK IMMEDIATE MEDICAL CARE IF:   You have chest pain.  You develop shortness of breath.  You have muscle weakness that gets worse.  You develop numbness, weakness or tingling on one side or one area of the body.  You have symptoms of an allergic reaction, such as:  Hives.  Difficulty breathing.  Swelling of the lips, face, tongue or throat.  You develop a fever of more than 102.0 F (38.9 C). MAKE SURE YOU:   Understand these instructions.  Will watch your condition.  Will get help right away if you are not doing well or get worse. Document   Released: 08/06/2008 Document Revised: 08/02/2011 Document Reviewed: 08/06/2008 [employer] Patient Information 2014 [employer].  

## 2013-06-12 ENCOUNTER — Ambulatory Visit (INDEPENDENT_AMBULATORY_CARE_PROVIDER_SITE_OTHER): Payer: BC Managed Care – PPO | Admitting: Family Medicine

## 2013-06-12 ENCOUNTER — Encounter: Payer: Self-pay | Admitting: Family Medicine

## 2013-06-12 VITALS — BP 143/90 | HR 64 | Temp 97.5°F | Ht 61.0 in | Wt 168.6 lb

## 2013-06-12 DIAGNOSIS — Z780 Asymptomatic menopausal state: Secondary | ICD-10-CM

## 2013-06-12 DIAGNOSIS — I10 Essential (primary) hypertension: Secondary | ICD-10-CM

## 2013-06-12 DIAGNOSIS — N951 Menopausal and female climacteric states: Secondary | ICD-10-CM

## 2013-06-12 DIAGNOSIS — E538 Deficiency of other specified B group vitamins: Secondary | ICD-10-CM

## 2013-06-12 DIAGNOSIS — E785 Hyperlipidemia, unspecified: Secondary | ICD-10-CM

## 2013-06-12 NOTE — Patient Instructions (Signed)
      Dr Hayat Warbington's Recommendations  For nutrition information, I recommend books:  1).Eat to Live by Dr Joel Fuhrman. 2).Prevent and Reverse Heart Disease by Dr Caldwell Esselstyn. 3) Dr Neal Barnard's Book:  Program to Reverse Diabetes  Exercise recommendations are:  If unable to walk, then the patient can exercise in a chair 3 times a day. By flapping arms like a bird gently and raising legs outwards to the front.  If ambulatory, the patient can go for walks for 30 minutes 3 times a week. Then increase the intensity and duration as tolerated.  Goal is to try to attain exercise frequency to 5 times a week.  If applicable: Best to perform resistance exercises (machines or weights) 2 days a week and cardio type exercises 3 days per week.  

## 2013-06-12 NOTE — Progress Notes (Signed)
Patient ID: Maria Dunlap, female   DOB: [birth year], 63 y.o.   MRN: [id number] SUBJECTIVE: CC: Chief Complaint  Patient presents with  . Follow-up    6 week follow up    HPI: Patient is here for follow up of hyperlipidemia/HTN/menopause:: denies Headache;denies Chest Pain;denies weakness;denies Shortness of Breath and orthopnea;denies Visual changes;denies palpitations;denies cough;denies pedal edema;denies symptoms of TIA or stroke;deniesClaudication symptoms. admits to Compliance with medications; denies Problems with medications.    Past Medical History  Diagnosis Date  . Hyperlipidemia   . Hypertension   . Osteopenia   . Menopause    Past Surgical History  Procedure Laterality Date  . Abdominal hysterectomy     History   Social History  . Marital Status: Married    Spouse Name: N/A    Number of Children: N/A  . Years of Education: N/A   Occupational History  . Not on file.   Social History Main Topics  . Smoking status: Former Smoker    Quit date: 05/24/1998  . Smokeless tobacco: Not on file  . Alcohol Use: No  . Drug Use: No  . Sexual Activity: Not on file   Other Topics Concern  . Not on file   Social History Narrative  . No narrative on file   No family history on file. Current Outpatient Prescriptions on File Prior to Visit  Medication Sig Dispense Refill  . cloNIDine (CATAPRES) 0.1 MG tablet Take 1 tablet (0.1 mg total) by mouth 2 (two) times daily.  60 tablet  5  . ezetimibe-simvastatin (VYTORIN) 10-40 MG per tablet Take 1 tablet by mouth at bedtime.  30 tablet  3  . fish oil-omega-3 fatty acids 1000 MG capsule Take 4 g by mouth daily.      Marland Kitchen lisinopril (PRINIVIL,ZESTRIL) 40 MG tablet Take 1 tablet (40 mg total) by mouth daily.  30 tablet  5  . Nutritional Supplements (ESTROVEN PO) Take by mouth. OTC       Current Facility-Administered Medications on File Prior to Visit  Medication Dose Route Frequency Provider Last Rate Last Dose  .  cyanocobalamin ((VITAMIN B-12)) injection 1,000 mcg  1,000 mcg Intramuscular Q30 days Chipper Herb, MD   1,000 mcg at 06/08/13 1158  . cyanocobalamin ((VITAMIN B-12)) injection 1,000 mcg  1,000 mcg Intramuscular Once Vernie Shanks, MD       Allergies  Allergen Reactions  . Amoxicillin   . Crestor [Rosuvastatin]   . Mycinette [Phenol]    Immunization History  Administered Date(s) Administered  . Td 06/04/2009   Prior to Admission medications   Medication Sig Start Date End Date Taking? Authorizing Provider  cloNIDine (CATAPRES) 0.1 MG tablet Take 1 tablet (0.1 mg total) by mouth 2 (two) times daily. 04/30/13   Vernie Shanks, MD  ezetimibe-simvastatin (VYTORIN) 10-40 MG per tablet Take 1 tablet by mouth at bedtime. 03/20/13   Vernie Shanks, MD  fish oil-omega-3 fatty acids 1000 MG capsule Take 4 g by mouth daily.    Historical Provider, MD  lisinopril (PRINIVIL,ZESTRIL) 40 MG tablet Take 1 tablet (40 mg total) by mouth daily. 01/08/13   Vernie Shanks, MD  Nutritional Supplements (ESTROVEN PO) Take by mouth. OTC    Historical Provider, MD     ROS: As above in the HPI. All other systems are stable or negative.  OBJECTIVE: APPEARANCE:  Patient in no acute distress.The patient appeared well nourished and normally developed. Acyanotic. Waist: VITAL SIGNS:BP 143/90  Pulse 64  Temp(Src) 97.5  F (36.4 C) (Oral)  Ht '5\' 1"'  (1.549 m)  Wt 168 lb 9.6 oz (76.476 kg)  BMI 31.87 kg/m2  BP 120 / 82 WF Obese  SKIN: warm and  Dry without overt rashes, tattoos and scars  HEAD and Neck: without JVD, Head and scalp: normal Eyes:No scleral icterus. Fundi normal, eye movements normal. Ears: Auricle normal, canal normal, Tympanic membranes normal, insufflation normal. Nose: normal Throat: normal Neck & thyroid: normal  CHEST & LUNGS: Chest wall: normal Lungs: Clear  CVS: Reveals the PMI to be normally located. Regular rhythm, First and Second Heart sounds are normal,  absence of  murmurs, rubs or gallops. Peripheral vasculature: Radial pulses: normal Dorsal pedis pulses: normal Posterior pulses: normal  ABDOMEN:  Appearance: Obese Benign, no organomegaly, no masses, no Abdominal Aortic enlargement. No Guarding , no rebound. No Bruits. Bowel sounds: normal  RECTAL: N/A GU: N/A  EXTREMETIES: nonedematous.  MUSCULOSKELETAL:  Spine: normal Joints: intact  NEUROLOGIC: oriented to time,place and person; nonfocal.  Results for orders placed in visit on 04/30/13  CMP14+EGFR      Result Value Range   Glucose 113 (*) 65 - 99 mg/dL   BUN 11  8 - 27 mg/dL   Creatinine, Ser 0.70  0.57 - 1.00 mg/dL   GFR calc non Af Amer 92  >59 mL/min/1.73   GFR calc Af Amer 106  >59 mL/min/1.73   BUN/Creatinine Ratio 16  11 - 26   Sodium 141  134 - 144 mmol/L   Potassium 4.4  3.5 - 5.2 mmol/L   Chloride 102  97 - 108 mmol/L   CO2 23  18 - 29 mmol/L   Calcium 9.7  8.6 - 10.2 mg/dL   Total Protein 6.5  6.0 - 8.5 g/dL   Albumin 4.3  3.6 - 4.8 g/dL   Globulin, Total 2.2  1.5 - 4.5 g/dL   Albumin/Globulin Ratio 2.0  1.1 - 2.5   Total Bilirubin 0.9  0.0 - 1.2 mg/dL   Alkaline Phosphatase 77  39 - 117 IU/L   AST 21  0 - 40 IU/L   ALT 25  0 - 32 IU/L  LIPID PANEL      Result Value Range   Cholesterol, Total 190  100 - 199 mg/dL   Triglycerides 121  0 - 149 mg/dL   HDL 85  >39 mg/dL   VLDL Cholesterol Cal 24  5 - 40 mg/dL   LDL Calculated 81  0 - 99 mg/dL   Chol/HDL Ratio 2.2  0.0 - 4.4 ratio units  VITAMIN B12      Result Value Range   Vitamin B-12 361  211 - 946 pg/mL  THYROID PANEL WITH TSH      Result Value Range   TSH 2.280  0.450 - 4.500 uIU/mL   T4, Total 6.4  4.5 - 12.0 ug/dL   T3 Uptake Ratio 30  24 - 39 %   Free Thyroxine Index 1.9  1.2 - 4.9    ASSESSMENT: Symptoms, such as flushing, sleeplessness, headache, lack of concentration, associated with the menopause  Menopause  HTN (hypertension)  HLD (hyperlipidemia)  B12 deficiency  PLAN:      Dr  Paula Libra Recommendations  For nutrition information, I recommend books:  1).Eat to Live by Dr Excell Seltzer. 2).Prevent and Reverse Heart Disease by Dr Karl Luke. 3) Dr Janene Harvey Book:  Program to Reverse Diabetes  Exercise recommendations are:  If unable to walk, then the patient can exercise  in a chair 3 times a day. By flapping arms like a bird gently and raising legs outwards to the front.  If ambulatory, the patient can go for walks for 30 minutes 3 times a week. Then increase the intensity and duration as tolerated.  Goal is to try to attain exercise frequency to 5 times a week.  If applicable: Best to perform resistance exercises (machines or weights) 2 days a week and cardio type exercises 3 days per week.    Discussed need for lifestyle changes and weight loss. Patient dislikes vegetables.and it is a challenge to eat vegetables.  No orders of the defined types were placed in this encounter.   No orders of the defined types were placed in this encounter.   There are no discontinued medications. Return in about 3 months (around 09/10/2013) for Recheck medical problems.  Enis Leatherwood P. Jacelyn Grip, M.D.

## 2013-06-18 ENCOUNTER — Telehealth: Payer: Self-pay | Admitting: Family Medicine

## 2013-06-19 NOTE — Telephone Encounter (Signed)
Left message for pt to return call.

## 2013-06-21 ENCOUNTER — Telehealth: Payer: Self-pay | Admitting: Family Medicine

## 2013-06-21 NOTE — Telephone Encounter (Signed)
Pt had cramps in legs and feet last week. Pt had stopped Vytorin previously because of insurance and when started back had leg cramps again. Not having any cramps now. Had back injections 06/01/13. Do you think it could be related to that?

## 2013-06-22 NOTE — Telephone Encounter (Signed)
Pt notified states cramps better and aware to call back if problems return  Pt verbalized understanding

## 2013-06-22 NOTE — Telephone Encounter (Signed)
Hard to say because the back problem could cause cramps in the legs. Observe for now/ wait and see.

## 2013-07-07 ENCOUNTER — Other Ambulatory Visit: Payer: Self-pay | Admitting: Family Medicine

## 2013-07-10 ENCOUNTER — Ambulatory Visit: Payer: BC Managed Care – PPO

## 2013-07-12 ENCOUNTER — Ambulatory Visit (INDEPENDENT_AMBULATORY_CARE_PROVIDER_SITE_OTHER): Payer: BC Managed Care – PPO | Admitting: *Deleted

## 2013-07-12 DIAGNOSIS — E538 Deficiency of other specified B group vitamins: Secondary | ICD-10-CM

## 2013-07-12 NOTE — Progress Notes (Signed)
Patient ID: Maria Dunlap, female   DOB: 20-Jan-1951, 63 y.o.   MRN: [id number] Pt tolerated inj well

## 2013-07-24 ENCOUNTER — Other Ambulatory Visit: Payer: Self-pay | Admitting: Family Medicine

## 2013-07-24 ENCOUNTER — Telehealth: Payer: Self-pay | Admitting: Family Medicine

## 2013-07-24 ENCOUNTER — Other Ambulatory Visit (INDEPENDENT_AMBULATORY_CARE_PROVIDER_SITE_OTHER): Payer: BC Managed Care – PPO

## 2013-07-24 DIAGNOSIS — R300 Dysuria: Secondary | ICD-10-CM

## 2013-07-24 DIAGNOSIS — N390 Urinary tract infection, site not specified: Secondary | ICD-10-CM

## 2013-07-24 LAB — POCT UA - MICROSCOPIC ONLY
Bacteria, U Microscopic: NEGATIVE
Casts, Ur, LPF, POC: NEGATIVE
Crystals, Ur, HPF, POC: NEGATIVE
Mucus, UA: NEGATIVE
RBC, urine, microscopic: NEGATIVE
Yeast, UA: NEGATIVE

## 2013-07-24 LAB — POCT URINALYSIS DIPSTICK
Bilirubin, UA: NEGATIVE
Blood, UA: NEGATIVE
Glucose, UA: NEGATIVE
Ketones, UA: NEGATIVE
Nitrite, UA: NEGATIVE
Protein, UA: NEGATIVE
Spec Grav, UA: <=1.005
Urobilinogen, UA: NEGATIVE
pH, UA: 7.5

## 2013-07-24 NOTE — Telephone Encounter (Signed)
Patient is going to come by on her lunch break and leave a urine specimen. Unable to give an appt for today and she would like urine checked.

## 2013-07-24 NOTE — Telephone Encounter (Signed)
Okay to do UA and UCx

## 2013-07-25 NOTE — Progress Notes (Signed)
Quick Note:  Call Patient Labs that are abnormal:  WBCs in the urine The rest is negative Recommendations: Needs to be seen to further evaluate this   ______

## 2013-07-26 ENCOUNTER — Telehealth: Payer: Self-pay | Admitting: Family Medicine

## 2013-07-26 NOTE — Telephone Encounter (Signed)
Patient aware of lab results and said there was no reason she needed to come in because we had all the information we needed she will just wait and see what happens.

## 2013-08-09 ENCOUNTER — Encounter: Payer: BC Managed Care – PPO | Admitting: *Deleted

## 2013-08-09 NOTE — Progress Notes (Signed)
This encounter was created in error - please disregard.

## 2013-08-13 ENCOUNTER — Ambulatory Visit (INDEPENDENT_AMBULATORY_CARE_PROVIDER_SITE_OTHER): Payer: BC Managed Care – PPO | Admitting: Family Medicine

## 2013-08-13 VITALS — BP 173/92 | HR 94 | Temp 96.9°F | Ht 61.0 in | Wt 169.0 lb

## 2013-08-13 DIAGNOSIS — N390 Urinary tract infection, site not specified: Secondary | ICD-10-CM

## 2013-08-13 DIAGNOSIS — E538 Deficiency of other specified B group vitamins: Secondary | ICD-10-CM

## 2013-08-13 DIAGNOSIS — R300 Dysuria: Secondary | ICD-10-CM

## 2013-08-13 LAB — POCT URINALYSIS DIPSTICK
Bilirubin, UA: NEGATIVE
Blood, UA: NEGATIVE
Glucose, UA: NEGATIVE
Ketones, UA: NEGATIVE
Nitrite, UA: NEGATIVE
Protein, UA: NEGATIVE
Spec Grav, UA: 1.01
Urobilinogen, UA: NEGATIVE
pH, UA: 7.5

## 2013-08-13 LAB — POCT UA - MICROSCOPIC ONLY
Casts, Ur, LPF, POC: NEGATIVE
Crystals, Ur, HPF, POC: NEGATIVE
Mucus, UA: NEGATIVE
RBC, urine, microscopic: NEGATIVE
Yeast, UA: NEGATIVE

## 2013-08-13 MED ORDER — CIPROFLOXACIN HCL 500 MG PO TABS: 500.0000 mg | ORAL_TABLET | Freq: Two times a day (BID) | ORAL | Status: DC

## 2013-08-13 MED ORDER — CLONIDINE HCL 0.1 MG PO TABS: 0.1000 mg | ORAL_TABLET | Freq: Three times a day (TID) | ORAL | Status: DC

## 2013-08-13 NOTE — Progress Notes (Signed)
   Subjective:    Patient ID: Maria Dunlap, female    DOB: [birth year], 63 y.o.   MRN: [id number]  HPI  This 63 y.o. female presents for evaluation of urinary frequency and dysuria for several weeks.  Review of Systems    No chest pain, SOB, HA, dizziness, vision change, N/V, diarrhea, constipation, myalgias, arthralgias or rash.  Objective:   Physical Exam  Vital signs noted  Well developed well nourished female.  HEENT - Head atraumatic Normocephalic                Eyes - PERRLA, Conjuctiva - clear Sclera- Clear EOMI                Ears - EAC's Wnl TM's Wnl Gross Hearing WNL                Throat - oropharanx wnl Respiratory - Lungs CTA bilateral Cardiac - RRR S1 and S2 without murmur GI - Abdomen soft Nontender and bowel sounds active x 4 Extremities - No edema. Neuro - Grossly intact.      Assessment & Plan:  Burning with urination - Plan: POCT UA - Microscopic Only, POCT urinalysis dipstick, Urine culture, ciprofloxacin (CIPRO) 500 MG tablet  UTI (urinary tract infection) - Plan: ciprofloxacin (CIPRO) 500 MG tablet  Deatra CanterWilliam J Journee Bobrowski FNP

## 2013-08-15 LAB — URINE CULTURE

## 2013-09-12 ENCOUNTER — Telehealth: Payer: Self-pay | Admitting: *Deleted

## 2013-09-12 ENCOUNTER — Ambulatory Visit (INDEPENDENT_AMBULATORY_CARE_PROVIDER_SITE_OTHER): Payer: BC Managed Care – PPO

## 2013-09-12 DIAGNOSIS — E538 Deficiency of other specified B group vitamins: Secondary | ICD-10-CM

## 2013-09-12 NOTE — Telephone Encounter (Signed)
Pt is requesting cheaper alternative to Vytorin. [facility]. Last lipids 12/14.

## 2013-09-16 ENCOUNTER — Other Ambulatory Visit: Payer: Self-pay | Admitting: Family Medicine

## 2013-09-16 DIAGNOSIS — E785 Hyperlipidemia, unspecified: Secondary | ICD-10-CM

## 2013-09-16 MED ORDER — ATORVASTATIN CALCIUM 40 MG PO TABS: 40.0000 mg | ORAL_TABLET | Freq: Every day | ORAL | Status: DC

## 2013-09-16 NOTE — Telephone Encounter (Signed)
Call patient : Prescription changed to atorvastatin & sent to pharmacy in [place]. This is the most comparable to the vytorin.

## 2013-09-17 ENCOUNTER — Encounter: Payer: Self-pay | Admitting: Family Medicine

## 2013-09-17 ENCOUNTER — Ambulatory Visit (INDEPENDENT_AMBULATORY_CARE_PROVIDER_SITE_OTHER): Payer: BC Managed Care – PPO | Admitting: Family Medicine

## 2013-09-17 VITALS — BP 156/94 | HR 69 | Temp 97.0°F | Ht 61.0 in | Wt 168.4 lb

## 2013-09-17 DIAGNOSIS — E785 Hyperlipidemia, unspecified: Secondary | ICD-10-CM

## 2013-09-17 DIAGNOSIS — Z780 Asymptomatic menopausal state: Secondary | ICD-10-CM

## 2013-09-17 DIAGNOSIS — E538 Deficiency of other specified B group vitamins: Secondary | ICD-10-CM

## 2013-09-17 DIAGNOSIS — I10 Essential (primary) hypertension: Secondary | ICD-10-CM

## 2013-09-17 DIAGNOSIS — N951 Menopausal and female climacteric states: Secondary | ICD-10-CM

## 2013-09-17 MED ORDER — LISINOPRIL-HYDROCHLOROTHIAZIDE 20-12.5 MG PO TABS: 2.0000 | ORAL_TABLET | Freq: Every day | ORAL | Status: DC

## 2013-09-17 NOTE — Progress Notes (Signed)
Patient ID: Maria Dunlap, female   DOB: 03-17-1951, 63 y.o.   MRN: [id number] SUBJECTIVE: CC: Chief Complaint  Patient presents with  . Follow-up    3 month ck up chronic problems no complaints.     HPI:  Patient is here for follow up of hyperlipidemia/HTN: denies Headache;denies Chest Pain;denies weakness;denies Shortness of Breath and orthopnea;denies Visual changes;denies palpitations;denies cough;denies pedal edema;denies symptoms of TIA or stroke;deniesClaudication symptoms. Difficult to be Compliant with medications;misses daytime clonidine. Really takes it twice a Day. denies Problems with medications.  Menopause  stable Past Medical History  Diagnosis Date  . Hyperlipidemia   . Hypertension   . Osteopenia   . Menopause    Past Surgical History  Procedure Laterality Date  . Abdominal hysterectomy     History   Social History  . Marital Status: Married    Spouse Name: N/A    Number of Children: N/A  . Years of Education: N/A   Occupational History  . Not on file.   Social History Main Topics  . Smoking status: Former Smoker    Quit date: 05/24/1998  . Smokeless tobacco: Not on file  . Alcohol Use: No  . Drug Use: No  . Sexual Activity: Not on file   Other Topics Concern  . Not on file   Social History Narrative  . No narrative on file   No family history on file. Current Outpatient Prescriptions on File Prior to Visit  Medication Sig Dispense Refill  . fish oil-omega-3 fatty acids 1000 MG capsule Take 4 g by mouth daily.      Marland Kitchen atorvastatin (LIPITOR) 40 MG tablet Take 1 tablet (40 mg total) by mouth daily.  30 tablet  3  . ciprofloxacin (CIPRO) 500 MG tablet Take 1 tablet (500 mg total) by mouth 2 (two) times daily.  20 tablet  0   Current Facility-Administered Medications on File Prior to Visit  Medication Dose Route Frequency Provider Last Rate Last Dose  . cyanocobalamin ((VITAMIN B-12)) injection 1,000 mcg  1,000 mcg Intramuscular Q30 days  Chipper Herb, MD   1,000 mcg at 09/12/13 1152  . cyanocobalamin ((VITAMIN B-12)) injection 1,000 mcg  1,000 mcg Intramuscular Once Vernie Shanks, MD       Allergies  Allergen Reactions  . Amoxicillin   . Crestor [Rosuvastatin]   . Mycinette [Phenol]    Immunization History  Administered Date(s) Administered  . Td 06/04/2009   Prior to Admission medications   Medication Sig Start Date End Date Taking? Authorizing Provider  cloNIDine (CATAPRES) 0.1 MG tablet Take 1 tablet (0.1 mg total) by mouth 3 (three) times daily. 08/13/13  Yes Lysbeth Penner, FNP  fish oil-omega-3 fatty acids 1000 MG capsule Take 4 g by mouth daily.   Yes Historical Provider, MD  lisinopril (PRINIVIL,ZESTRIL) 40 MG tablet TAKE 1 TABLET (40 MG TOTAL) BY MOUTH DAILY.   Yes Vernie Shanks, MD  atorvastatin (LIPITOR) 40 MG tablet Take 1 tablet (40 mg total) by mouth daily. 09/16/13   Vernie Shanks, MD  ciprofloxacin (CIPRO) 500 MG tablet Take 1 tablet (500 mg total) by mouth 2 (two) times daily. 08/13/13   Lysbeth Penner, FNP     ROS: As above in the HPI. All other systems are stable or negative.  OBJECTIVE: APPEARANCE:  Patient in no acute distress.The patient appeared well nourished and normally developed. Acyanotic. Waist: VITAL SIGNS:BP 156/94  Pulse 69  Temp(Src) 97 F (36.1 C) (Oral)  Ht 5'  1" (1.549 m)  Wt 168 lb 6.4 oz (76.386 kg)  BMI 31.84 kg/m2 WF  SKIN: warm and  Dry without overt rashes, tattoos and scars  HEAD and Neck: without JVD, Head and scalp: normal Eyes:No scleral icterus. Fundi normal, eye movements normal. Ears: Auricle normal, canal normal, Tympanic membranes normal, insufflation normal. Nose: normal Throat: normal Neck & thyroid: normal  CHEST & LUNGS: Chest wall: normal Lungs: Clear  CVS: Reveals the PMI to be normally located. Regular rhythm, First and Second Heart sounds are normal,  absence of murmurs, rubs or gallops. Peripheral vasculature: Radial pulses:  normal Dorsal pedis pulses: normal Posterior pulses: normal  ABDOMEN:  Appearance: normal Benign, no organomegaly, no masses, no Abdominal Aortic enlargement. No Guarding , no rebound. No Bruits. Bowel sounds: normal  RECTAL: N/A GU: N/A  EXTREMETIES: nonedematous.  MUSCULOSKELETAL:  Spine: normal Joints: intact  NEUROLOGIC: oriented to time,place and person; nonfocal. Strength is normal Sensory is normal Reflexes are normal Cranial Nerves are normal.  Results for orders placed in visit on 08/13/13  URINE CULTURE      Result Value Ref Range   Urine Culture, Routine Final report (*)    Result 1 Proteus mirabilis (*)    ANTIMICROBIAL SUSCEPTIBILITY Comment    POCT UA - MICROSCOPIC ONLY      Result Value Ref Range   WBC, Ur, HPF, POC 1-5     RBC, urine, microscopic neg     Bacteria, U Microscopic rare     Mucus, UA neg     Epithelial cells, urine per micros occ     Crystals, Ur, HPF, POC neg     Casts, Ur, LPF, POC neg     Yeast, UA neg    POCT URINALYSIS DIPSTICK      Result Value Ref Range   Color, UA yellow     Clarity, UA clear     Glucose, UA neg     Bilirubin, UA neg     Ketones, UA neg     Spec Grav, UA 1.010     Blood, UA neg     pH, UA 7.5     Protein, UA neg     Urobilinogen, UA negative     Nitrite, UA neg     Leukocytes, UA Trace      ASSESSMENT:  HTN (hypertension) - Plan: lisinopril-hydrochlorothiazide (PRINZIDE,ZESTORETIC) 20-12.5 MG per tablet, CMP14+EGFR  HLD (hyperlipidemia) - Plan: Lipid panel  Menopause  B12 deficiency - Plan: Vitamin B12 BP not at goal.   PLAN:  Added a diuretic to the lisinopril.  Rx for BP machine Dash diet. Dietary changes  Discussed. Orders Placed This Encounter  Procedures  . Vitamin B12  . CMP14+EGFR  . Lipid panel   Meds ordered this encounter  Medications  . cloNIDine (CATAPRES) 0.1 MG tablet    Sig: Take 0.1 mg by mouth 2 (two) times daily.  Marland Kitchen lisinopril-hydrochlorothiazide  (PRINZIDE,ZESTORETIC) 20-12.5 MG per tablet    Sig: Take 2 tablets by mouth daily.    Dispense:  60 tablet    Refill:  5   Medications Discontinued During This Encounter  Medication Reason  . cloNIDine (CATAPRES) 0.1 MG tablet   . lisinopril (PRINIVIL,ZESTRIL) 40 MG tablet Change in therapy   Return in about 4 weeks (around 10/15/2013) for recheck BP.  Nolie Bignell P. Jacelyn Grip, M.D.

## 2013-09-17 NOTE — Telephone Encounter (Signed)
Pt seen today by Dr Modesto CharonWong and meds discussed

## 2013-09-17 NOTE — Patient Instructions (Signed)
DASH Diet  The DASH diet stands for "Dietary Approaches to Stop Hypertension." It is a healthy eating plan that has been shown to reduce high blood pressure (hypertension) in as little as 14 days, while also possibly providing other significant health benefits. These other health benefits include reducing the risk of breast cancer after menopause and reducing the risk of type 2 diabetes, heart disease, colon cancer, and stroke. Health benefits also include weight loss and slowing kidney failure in patients with chronic kidney disease.   DIET GUIDELINES  · Limit salt (sodium). Your diet should contain less than 1500 mg of sodium daily.  · Limit refined or processed carbohydrates. Your diet should include mostly whole grains. Desserts and added sugars should be used sparingly.  · Include small amounts of heart-healthy fats. These types of fats include nuts, oils, and tub margarine. Limit saturated and trans fats. These fats have been shown to be harmful in the body.  CHOOSING FOODS   The following food groups are based on a 2000 calorie diet. See your Registered Dietitian for individual calorie needs.  Grains and Grain Products (6 to 8 servings daily)  · Eat More Often: Whole-wheat bread, brown rice, whole-grain or wheat pasta, quinoa, popcorn without added fat or salt (air popped).  · Eat Less Often: White bread, white pasta, white rice, cornbread.  Vegetables (4 to 5 servings daily)  · Eat More Often: Fresh, frozen, and canned vegetables. Vegetables may be raw, steamed, roasted, or grilled with a minimal amount of fat.  · Eat Less Often/Avoid: Creamed or fried vegetables. Vegetables in a cheese sauce.  Fruit (4 to 5 servings daily)  · Eat More Often: All fresh, canned (in natural juice), or frozen fruits. Dried fruits without added sugar. One hundred percent fruit juice (½ cup [237 mL] daily).  · Eat Less Often: Dried fruits with added sugar. Canned fruit in light or heavy syrup.  Lean Meats, Fish, and Poultry (2  servings or less daily. One serving is 3 to 4 oz [85-114 g]).  · Eat More Often: Ninety percent or leaner ground beef, tenderloin, sirloin. Round cuts of beef, chicken breast, turkey breast. All fish. Grill, bake, or broil your meat. Nothing should be fried.  · Eat Less Often/Avoid: Fatty cuts of meat, turkey, or chicken leg, thigh, or wing. Fried cuts of meat or fish.  Dairy (2 to 3 servings)  · Eat More Often: Low-fat or fat-free milk, low-fat plain or light yogurt, reduced-fat or part-skim cheese.  · Eat Less Often/Avoid: Milk (whole, 2%). Whole milk yogurt. Full-fat cheeses.  Nuts, Seeds, and Legumes (4 to 5 servings per week)  · Eat More Often: All without added salt.  · Eat Less Often/Avoid: Salted nuts and seeds, canned beans with added salt.  Fats and Sweets (limited)  · Eat More Often: Vegetable oils, tub margarines without trans fats, sugar-free gelatin. Mayonnaise and salad dressings.  · Eat Less Often/Avoid: Coconut oils, palm oils, butter, stick margarine, cream, half and half, cookies, candy, pie.  FOR MORE INFORMATION  The Dash Diet Eating Plan: www.dashdiet.org  Document Released: 04/29/2011 Document Revised: 08/02/2011 Document Reviewed: 04/29/2011  ExitCare® Patient Information ©2014 [employer].

## 2013-09-18 LAB — CMP14+EGFR
ALT: 21 IU/L (ref 0–32)
AST: 23 IU/L (ref 0–40)
Albumin/Globulin Ratio: 2 (ref 1.1–2.5)
Albumin: 4.3 g/dL (ref 3.6–4.8)
Alkaline Phosphatase: 74 IU/L (ref 39–117)
BUN/Creatinine Ratio: 15 (ref 11–26)
BUN: 12 mg/dL (ref 8–27)
CO2: 23 mmol/L (ref 18–29)
Calcium: 9.2 mg/dL (ref 8.7–10.3)
Chloride: 105 mmol/L (ref 97–108)
Creatinine, Ser: 0.78 mg/dL (ref 0.57–1.00)
GFR calc Af Amer: 92 mL/min/{1.73_m2} (ref >59)
GFR calc non Af Amer: 80 mL/min/{1.73_m2} (ref >59)
Globulin, Total: 2.1 g/dL (ref 1.5–4.5)
Glucose: 120 mg/dL — ABNORMAL HIGH (ref 65–99)
Potassium: 4.3 mmol/L (ref 3.5–5.2)
Sodium: 144 mmol/L (ref 134–144)
Total Bilirubin: 0.4 mg/dL (ref 0.0–1.2)
Total Protein: 6.4 g/dL (ref 6.0–8.5)

## 2013-09-18 LAB — LIPID PANEL
Chol/HDL Ratio: 3.8 ratio units (ref 0.0–4.4)
Cholesterol, Total: 248 mg/dL — ABNORMAL HIGH (ref 100–199)
HDL: 65 mg/dL (ref >39)
LDL Calculated: 153 mg/dL — ABNORMAL HIGH (ref 0–99)
Triglycerides: 150 mg/dL — ABNORMAL HIGH (ref 0–149)
VLDL Cholesterol Cal: 30 mg/dL (ref 5–40)

## 2013-09-18 LAB — VITAMIN B12: Vitamin B-12: 438 pg/mL (ref 211–946)

## 2013-09-21 ENCOUNTER — Telehealth: Payer: Self-pay | Admitting: Pharmacist

## 2013-09-24 ENCOUNTER — Encounter: Payer: BC Managed Care – PPO | Admitting: *Deleted

## 2013-09-24 NOTE — Telephone Encounter (Signed)
Patient was taking Vytorin but high co pay.  Dr Modesto CharonWong gave me paperwork and asked me to call patient with alterntive.  When I reviewed chart it looks like was already changed to atorvastatin.  I called just to make sure this change was communicated to patient and she verified that it had and she was not taking atrovastatin.

## 2013-09-24 NOTE — Progress Notes (Signed)
This encounter was created in error - please disregard. Patient came in today complaining of being dizzy, light headed and wanted her BP check. Patient was notified that she would need to be seen by a provider to have these issued looked at and patient wanted to only see Modesto CharonWong. I asked Dr. Modesto CharonWong if she would see her and he agreed but patient was notified that he was running behind and it may be a wait patient got upset and stated that she would go find her a Dr in [place] because she didn't have time to wait.

## 2013-10-08 ENCOUNTER — Telehealth: Payer: Self-pay | Admitting: *Deleted

## 2013-10-08 NOTE — Telephone Encounter (Signed)
Spoke with patient- tried hydrcortisne cream.

## 2013-10-08 NOTE — Telephone Encounter (Signed)
Patient was out in the sun all day yesterday and her arms have little blisters and she was reading where the medication states stay out of the sun lisinopril/ hctz says to watch sun exposure. Please advise patient use to see Dr. Modesto CharonWong patient states that she use to be on lisinopril and amlodipne and states she done really well on this

## 2013-10-08 NOTE — Telephone Encounter (Signed)
Patient knows its not sun poisoning . She was not oustside 20 min its not that. Patient is upset saying we just want to get her copay before  We even try to help her

## 2013-10-08 NOTE — Telephone Encounter (Signed)
Could be sum poisoning- may need steroid shot Will NTBS

## 2013-10-16 ENCOUNTER — Ambulatory Visit (INDEPENDENT_AMBULATORY_CARE_PROVIDER_SITE_OTHER): Payer: BC Managed Care – PPO | Admitting: *Deleted

## 2013-10-16 DIAGNOSIS — E538 Deficiency of other specified B group vitamins: Secondary | ICD-10-CM

## 2013-10-22 ENCOUNTER — Ambulatory Visit (INDEPENDENT_AMBULATORY_CARE_PROVIDER_SITE_OTHER): Payer: BC Managed Care – PPO | Admitting: *Deleted

## 2013-10-22 VITALS — BP 121/76 | HR 66

## 2013-10-22 DIAGNOSIS — I10 Essential (primary) hypertension: Secondary | ICD-10-CM

## 2013-10-22 NOTE — Progress Notes (Signed)
Patient came in today for a repeat BP check per Dr. Modesto Charon. I advised patient that I would have another provider look at it and if any changes needed to be made we would contact her. BP 121/76  Pulse 66

## 2013-10-22 NOTE — Progress Notes (Signed)
B/P looks great! Continue current medications and low NA diet.

## 2013-11-19 ENCOUNTER — Ambulatory Visit (INDEPENDENT_AMBULATORY_CARE_PROVIDER_SITE_OTHER): Payer: BC Managed Care – PPO | Admitting: *Deleted

## 2013-11-19 DIAGNOSIS — E538 Deficiency of other specified B group vitamins: Secondary | ICD-10-CM

## 2013-11-19 NOTE — Progress Notes (Signed)
Vitamin b12 given and tolerated well. 

## 2013-11-19 NOTE — Patient Instructions (Signed)
Vitamin B12 Injections Every person needs vitamin B12. A deficiency develops when the body does not get enough of it. One way to overcome this is by getting B12 shots (injections). A B12 shot puts the vitamin directly into muscle tissue. This avoids any problems your body might have in absorbing it from food or a pill. In some people, the body has trouble using the vitamin correctly. This can cause a B12 deficiency. Not consuming enough of the vitamin can also cause a deficiency. Getting enough vitamin B12 can be hard for elderly people. Sometimes, they do not eat a well-balanced diet. The elderly are also more likely than younger people to have medical conditions or take medications that can lead to a deficiency. WHAT DOES VITAMIN B12 DO? Vitamin B12 does many things to help the body work right:  It helps the body make healthy red blood cells.  It helps maintain nerve cells.  It is involved in the body's process of converting food into energy (metabolism).  It is needed to make the genetic material in all cells (DNA). VITAMIN B12 FOOD SOURCES Most people get plenty of vitamin B12 through the foods they eat. It is present in:  Meat, fish, poultry, and eggs.  Milk and milk products.  It also is added when certain foods are made, including some breads, cereals and yogurts. The food is then called "fortified". CAUSES The most common causes of vitamin B12 deficiency are:  Pernicious anemia. The condition develops when the body cannot make enough healthy red blood cells. This stems from a lack of a protein made in the stomach (intrinsic factor). People without this protein cannot absorb enough vitamin B12 from food.  Malabsorption. This is when the body cannot absorb the vitamin. It can be caused by:  Pernicious anemia.  Surgery to remove part or all of the stomach can lead to malabsorption. Removal of part or all of the small intestine can also cause malabsorption.  Vegetarian diet.  People who are strict about not eating foods from animals could have trouble taking in enough vitamin B12 from diet alone.  Medications. Some medicines have been linked to B12 deficiency, such as Metformin (a drug prescribed for type 2 diabetes). Long-term use of stomach acid suppressants also can keep the vitamin from being absorbed.  Intestinal problems such as inflammatory bowel disease. If there are problems in the digestive tract, vitamin B12 may not be absorbed in good enough amounts. SYMPTOMS People who do not get enough B12 can develop problems. These can include:  Anemia. This is when the body has too few red blood cells. Red blood cells carry oxygen to the rest of the body. Without a healthy supply of red blood cells, people can feel:  Tired (fatigued).  Weak.  Severe anemia can cause:  Shortness of breath.  Dizziness.  Rapid heart rate.  Paleness.  Other Vitamin B12 deficiency symptoms include:  Diarrhea.  Numbness or tingling in the hands or feet.  Loss of appetite.  Confusion.  Sores on the tongue or in the mouth. LET YOUR CAREGIVER KNOW ABOUT:  Any allergies. It is very important to know if you are allergic or sensitive to cobalt. Vitamin B12 contains cobalt.  Any history of kidney disease.  All medications you are taking. Include prescription and over-the-counter medicines, herbs and creams.  Whether you are pregnant or breast-feeding.  If you have Leber's disease, a hereditary eye condition, vitamin B12 could make it worse. RISKS AND COMPLICATIONS Reactions to an injection are   usually temporary. They might include:  Pain at the injection site.  Redness, swelling or tenderness at the site.  Headache, dizziness or weakness.  Nausea, upset stomach or diarrhea.  Numbness or tingling.  Fever.  Joint pain.  Itching or rash. If a reaction does not go away in a short while, talk with your healthcare provider. A change in the way the shots are  given, or where they are given, might need to be made. BEFORE AN INJECTION To decide whether B12 injections are right for you, your healthcare provider will probably:  Ask about your medical history.  Ask questions about your diet.  Ask about symptoms such as:  Have you felt weak?  Do you feel unusually tired?  Do you get dizzy?  Order blood tests. These may include a test to:  Check the level of red cells in your blood.  Measure B12 levels.  Check for the presence of intrinsic factor. VITAMIN B12 INJECTIONS How often you will need a vitamin B12 injection will depend on how severe your deficiency is. This also will affect how long you will need to get them. People with pernicious anemia usually get injections for their entire life. Others might get them for a shorter period. For many people, injections are given daily or weekly for several weeks. Then, once B12 levels are normal, injections are given just once a month. If the cause of the deficiency can be fixed, the injections can be stopped. Talk with your healthcare provider about what you should expect. For an injection:  The injection site will be cleaned with an alcohol swab.  Your healthcare provider will insert a needle directly into a muscle. Most any muscle can be used. Most often, an arm muscle is used. A buttocks muscle can also be used. Many people say shots in that area are less painful.  A small adhesive bandage may be put over the injection site. It usually can be taken off in an hour or less. Injections can be given by your healthcare provider. In some cases, family members give them. Sometimes, people give them to themselves. Talk with your healthcare provider about what would be best for you. If someone other than your healthcare provider will be giving the shots, the person will need to be trained to give them correctly. HOME CARE INSTRUCTIONS   You can remove the adhesive bandage within an hour of getting a  shot.  You should be able to go about your normal activities right away.  Avoid drinking large amounts of alcohol while taking vitamin B12 shots. Alcohol can interfere with the body's use of the vitamin. SEEK MEDICAL CARE IF:   Pain, redness, swelling or tenderness at the injection site does not get better or gets worse.  Headache, dizziness or weakness does not go away.  You develop a fever of more than 100.5 F (38.1 C). SEEK IMMEDIATE MEDICAL CARE IF:   You have chest pain.  You develop shortness of breath.  You have muscle weakness that gets worse.  You develop numbness, weakness or tingling on one side or one area of the body.  You have symptoms of an allergic reaction, such as:  Hives.  Difficulty breathing.  Swelling of the lips, face, tongue or throat.  You develop a fever of more than 102.0 F (38.9 C). MAKE SURE YOU:   Understand these instructions.  Will watch your condition.  Will get help right away if you are not doing well or get worse. Document   Released: 08/06/2008 Document Revised: 08/02/2011 Document Reviewed: 08/06/2008 ExitCare Patient Information 2015 [employer]. This information is not intended to replace advice given to you by your health care provider. Make sure you discuss any questions you have with your health care provider.  

## 2013-12-20 ENCOUNTER — Ambulatory Visit (INDEPENDENT_AMBULATORY_CARE_PROVIDER_SITE_OTHER): Payer: BC Managed Care – PPO | Admitting: *Deleted

## 2013-12-20 DIAGNOSIS — E538 Deficiency of other specified B group vitamins: Secondary | ICD-10-CM

## 2013-12-20 NOTE — Progress Notes (Signed)
Patient tolerated well.

## 2013-12-20 NOTE — Patient Instructions (Signed)
Vitamin B12 Injections Every person needs vitamin B12. A deficiency develops when the body does not get enough of it. One way to overcome this is by getting B12 shots (injections). A B12 shot puts the vitamin directly into muscle tissue. This avoids any problems your body might have in absorbing it from food or a pill. In some people, the body has trouble using the vitamin correctly. This can cause a B12 deficiency. Not consuming enough of the vitamin can also cause a deficiency. Getting enough vitamin B12 can be hard for elderly people. Sometimes, they do not eat a well-balanced diet. The elderly are also more likely than younger people to have medical conditions or take medications that can lead to a deficiency. WHAT DOES VITAMIN B12 DO? Vitamin B12 does many things to help the body work right:  It helps the body make healthy red blood cells.  It helps maintain nerve cells.  It is involved in the body's process of converting food into energy (metabolism).  It is needed to make the genetic material in all cells (DNA). VITAMIN B12 FOOD SOURCES Most people get plenty of vitamin B12 through the foods they eat. It is present in:  Meat, fish, poultry, and eggs.  Milk and milk products.  It also is added when certain foods are made, including some breads, cereals and yogurts. The food is then called "fortified". CAUSES The most common causes of vitamin B12 deficiency are:  Pernicious anemia. The condition develops when the body cannot make enough healthy red blood cells. This stems from a lack of a protein made in the stomach (intrinsic factor). People without this protein cannot absorb enough vitamin B12 from food.  Malabsorption. This is when the body cannot absorb the vitamin. It can be caused by:  Pernicious anemia.  Surgery to remove part or all of the stomach can lead to malabsorption. Removal of part or all of the small intestine can also cause malabsorption.  Vegetarian diet.  People who are strict about not eating foods from animals could have trouble taking in enough vitamin B12 from diet alone.  Medications. Some medicines have been linked to B12 deficiency, such as Metformin (a drug prescribed for type 2 diabetes). Long-term use of stomach acid suppressants also can keep the vitamin from being absorbed.  Intestinal problems such as inflammatory bowel disease. If there are problems in the digestive tract, vitamin B12 may not be absorbed in good enough amounts. SYMPTOMS People who do not get enough B12 can develop problems. These can include:  Anemia. This is when the body has too few red blood cells. Red blood cells carry oxygen to the rest of the body. Without a healthy supply of red blood cells, people can feel:  Tired (fatigued).  Weak.  Severe anemia can cause:  Shortness of breath.  Dizziness.  Rapid heart rate.  Paleness.  Other Vitamin B12 deficiency symptoms include:  Diarrhea.  Numbness or tingling in the hands or feet.  Loss of appetite.  Confusion.  Sores on the tongue or in the mouth. LET YOUR CAREGIVER KNOW ABOUT:  Any allergies. It is very important to know if you are allergic or sensitive to cobalt. Vitamin B12 contains cobalt.  Any history of kidney disease.  All medications you are taking. Include prescription and over-the-counter medicines, herbs and creams.  Whether you are pregnant or breast-feeding.  If you have Leber's disease, a hereditary eye condition, vitamin B12 could make it worse. RISKS AND COMPLICATIONS Reactions to an injection are   usually temporary. They might include:  Pain at the injection site.  Redness, swelling or tenderness at the site.  Headache, dizziness or weakness.  Nausea, upset stomach or diarrhea.  Numbness or tingling.  Fever.  Joint pain.  Itching or rash. If a reaction does not go away in a short while, talk with your healthcare provider. A change in the way the shots are  given, or where they are given, might need to be made. BEFORE AN INJECTION To decide whether B12 injections are right for you, your healthcare provider will probably:  Ask about your medical history.  Ask questions about your diet.  Ask about symptoms such as:  Have you felt weak?  Do you feel unusually tired?  Do you get dizzy?  Order blood tests. These may include a test to:  Check the level of red cells in your blood.  Measure B12 levels.  Check for the presence of intrinsic factor. VITAMIN B12 INJECTIONS How often you will need a vitamin B12 injection will depend on how severe your deficiency is. This also will affect how long you will need to get them. People with pernicious anemia usually get injections for their entire life. Others might get them for a shorter period. For many people, injections are given daily or weekly for several weeks. Then, once B12 levels are normal, injections are given just once a month. If the cause of the deficiency can be fixed, the injections can be stopped. Talk with your healthcare provider about what you should expect. For an injection:  The injection site will be cleaned with an alcohol swab.  Your healthcare provider will insert a needle directly into a muscle. Most any muscle can be used. Most often, an arm muscle is used. A buttocks muscle can also be used. Many people say shots in that area are less painful.  A small adhesive bandage may be put over the injection site. It usually can be taken off in an hour or less. Injections can be given by your healthcare provider. In some cases, family members give them. Sometimes, people give them to themselves. Talk with your healthcare provider about what would be best for you. If someone other than your healthcare provider will be giving the shots, the person will need to be trained to give them correctly. HOME CARE INSTRUCTIONS   You can remove the adhesive bandage within an hour of getting a  shot.  You should be able to go about your normal activities right away.  Avoid drinking large amounts of alcohol while taking vitamin B12 shots. Alcohol can interfere with the body's use of the vitamin. SEEK MEDICAL CARE IF:   Pain, redness, swelling or tenderness at the injection site does not get better or gets worse.  Headache, dizziness or weakness does not go away.  You develop a fever of more than 100.5 F (38.1 C). SEEK IMMEDIATE MEDICAL CARE IF:   You have chest pain.  You develop shortness of breath.  You have muscle weakness that gets worse.  You develop numbness, weakness or tingling on one side or one area of the body.  You have symptoms of an allergic reaction, such as:  Hives.  Difficulty breathing.  Swelling of the lips, face, tongue or throat.  You develop a fever of more than 102.0 F (38.9 C). MAKE SURE YOU:   Understand these instructions.  Will watch your condition.  Will get help right away if you are not doing well or get worse. Document   Released: 08/06/2008 Document Revised: 08/02/2011 Document Reviewed: 08/06/2008 ExitCare Patient Information 2015 [employer]. This information is not intended to replace advice given to you by your health care provider. Make sure you discuss any questions you have with your health care provider.  

## 2014-01-14 ENCOUNTER — Other Ambulatory Visit: Payer: Self-pay | Admitting: *Deleted

## 2014-01-14 DIAGNOSIS — E785 Hyperlipidemia, unspecified: Secondary | ICD-10-CM

## 2014-01-14 NOTE — Telephone Encounter (Signed)
Last lipids 4/15. Dr. Modesto Charon pt.

## 2014-01-14 NOTE — Telephone Encounter (Signed)
No refills until seen for follow up and labs

## 2014-01-17 ENCOUNTER — Telehealth: Payer: Self-pay | Admitting: *Deleted

## 2014-01-17 DIAGNOSIS — E785 Hyperlipidemia, unspecified: Secondary | ICD-10-CM

## 2014-01-17 MED ORDER — ATORVASTATIN CALCIUM 40 MG PO TABS: 40.0000 mg | ORAL_TABLET | Freq: Every day | ORAL | Status: DC

## 2014-01-17 NOTE — Telephone Encounter (Signed)
done

## 2014-01-21 ENCOUNTER — Ambulatory Visit (INDEPENDENT_AMBULATORY_CARE_PROVIDER_SITE_OTHER): Payer: BC Managed Care – PPO | Admitting: *Deleted

## 2014-01-21 DIAGNOSIS — E538 Deficiency of other specified B group vitamins: Secondary | ICD-10-CM

## 2014-01-21 NOTE — Patient Instructions (Signed)
Vitamin B12 Injections Every person needs vitamin B12. A deficiency develops when the body does not get enough of it. One way to overcome this is by getting B12 shots (injections). A B12 shot puts the vitamin directly into muscle tissue. This avoids any problems your body might have in absorbing it from food or a pill. In some people, the body has trouble using the vitamin correctly. This can cause a B12 deficiency. Not consuming enough of the vitamin can also cause a deficiency. Getting enough vitamin B12 can be hard for elderly people. Sometimes, they do not eat a well-balanced diet. The elderly are also more likely than younger people to have medical conditions or take medications that can lead to a deficiency. WHAT DOES VITAMIN B12 DO? Vitamin B12 does many things to help the body work right:  It helps the body make healthy red blood cells.  It helps maintain nerve cells.  It is involved in the body's process of converting food into energy (metabolism).  It is needed to make the genetic material in all cells (DNA). VITAMIN B12 FOOD SOURCES Most people get plenty of vitamin B12 through the foods they eat. It is present in:  Meat, fish, poultry, and eggs.  Milk and milk products.  It also is added when certain foods are made, including some breads, cereals and yogurts. The food is then called "fortified". CAUSES The most common causes of vitamin B12 deficiency are:  Pernicious anemia. The condition develops when the body cannot make enough healthy red blood cells. This stems from a lack of a protein made in the stomach (intrinsic factor). People without this protein cannot absorb enough vitamin B12 from food.  Malabsorption. This is when the body cannot absorb the vitamin. It can be caused by:  Pernicious anemia.  Surgery to remove part or all of the stomach can lead to malabsorption. Removal of part or all of the small intestine can also cause malabsorption.  Vegetarian diet.  People who are strict about not eating foods from animals could have trouble taking in enough vitamin B12 from diet alone.  Medications. Some medicines have been linked to B12 deficiency, such as Metformin (a drug prescribed for type 2 diabetes). Long-term use of stomach acid suppressants also can keep the vitamin from being absorbed.  Intestinal problems such as inflammatory bowel disease. If there are problems in the digestive tract, vitamin B12 may not be absorbed in good enough amounts. SYMPTOMS People who do not get enough B12 can develop problems. These can include:  Anemia. This is when the body has too few red blood cells. Red blood cells carry oxygen to the rest of the body. Without a healthy supply of red blood cells, people can feel:  Tired (fatigued).  Weak.  Severe anemia can cause:  Shortness of breath.  Dizziness.  Rapid heart rate.  Paleness.  Other Vitamin B12 deficiency symptoms include:  Diarrhea.  Numbness or tingling in the hands or feet.  Loss of appetite.  Confusion.  Sores on the tongue or in the mouth. LET YOUR CAREGIVER KNOW ABOUT:  Any allergies. It is very important to know if you are allergic or sensitive to cobalt. Vitamin B12 contains cobalt.  Any history of kidney disease.  All medications you are taking. Include prescription and over-the-counter medicines, herbs and creams.  Whether you are pregnant or breast-feeding.  If you have Leber's disease, a hereditary eye condition, vitamin B12 could make it worse. RISKS AND COMPLICATIONS Reactions to an injection are   usually temporary. They might include:  Pain at the injection site.  Redness, swelling or tenderness at the site.  Headache, dizziness or weakness.  Nausea, upset stomach or diarrhea.  Numbness or tingling.  Fever.  Joint pain.  Itching or rash. If a reaction does not go away in a short while, talk with your healthcare provider. A change in the way the shots are  given, or where they are given, might need to be made. BEFORE AN INJECTION To decide whether B12 injections are right for you, your healthcare provider will probably:  Ask about your medical history.  Ask questions about your diet.  Ask about symptoms such as:  Have you felt weak?  Do you feel unusually tired?  Do you get dizzy?  Order blood tests. These may include a test to:  Check the level of red cells in your blood.  Measure B12 levels.  Check for the presence of intrinsic factor. VITAMIN B12 INJECTIONS How often you will need a vitamin B12 injection will depend on how severe your deficiency is. This also will affect how long you will need to get them. People with pernicious anemia usually get injections for their entire life. Others might get them for a shorter period. For many people, injections are given daily or weekly for several weeks. Then, once B12 levels are normal, injections are given just once a month. If the cause of the deficiency can be fixed, the injections can be stopped. Talk with your healthcare provider about what you should expect. For an injection:  The injection site will be cleaned with an alcohol swab.  Your healthcare provider will insert a needle directly into a muscle. Most any muscle can be used. Most often, an arm muscle is used. A buttocks muscle can also be used. Many people say shots in that area are less painful.  A small adhesive bandage may be put over the injection site. It usually can be taken off in an hour or less. Injections can be given by your healthcare provider. In some cases, family members give them. Sometimes, people give them to themselves. Talk with your healthcare provider about what would be best for you. If someone other than your healthcare provider will be giving the shots, the person will need to be trained to give them correctly. HOME CARE INSTRUCTIONS   You can remove the adhesive bandage within an hour of getting a  shot.  You should be able to go about your normal activities right away.  Avoid drinking large amounts of alcohol while taking vitamin B12 shots. Alcohol can interfere with the body's use of the vitamin. SEEK MEDICAL CARE IF:   Pain, redness, swelling or tenderness at the injection site does not get better or gets worse.  Headache, dizziness or weakness does not go away.  You develop a fever of more than 100.5 F (38.1 C). SEEK IMMEDIATE MEDICAL CARE IF:   You have chest pain.  You develop shortness of breath.  You have muscle weakness that gets worse.  You develop numbness, weakness or tingling on one side or one area of the body.  You have symptoms of an allergic reaction, such as:  Hives.  Difficulty breathing.  Swelling of the lips, face, tongue or throat.  You develop a fever of more than 102.0 F (38.9 C). MAKE SURE YOU:   Understand these instructions.  Will watch your condition.  Will get help right away if you are not doing well or get worse. Document   Released: 08/06/2008 Document Revised: 08/02/2011 Document Reviewed: 08/06/2008 ExitCare Patient Information 2015 [employer]. This information is not intended to replace advice given to you by your health care provider. Make sure you discuss any questions you have with your health care provider.  

## 2014-01-21 NOTE — Progress Notes (Signed)
Patient tolerated well.

## 2014-01-26 ENCOUNTER — Other Ambulatory Visit: Payer: Self-pay | Admitting: Family Medicine

## 2014-02-25 ENCOUNTER — Ambulatory Visit (INDEPENDENT_AMBULATORY_CARE_PROVIDER_SITE_OTHER): Payer: BC Managed Care – PPO | Admitting: *Deleted

## 2014-02-25 DIAGNOSIS — E538 Deficiency of other specified B group vitamins: Secondary | ICD-10-CM

## 2014-02-25 NOTE — Patient Instructions (Signed)

## 2014-02-25 NOTE — Progress Notes (Signed)
Patient ID: Maria HoistLinda Dunlap, female   DOB: Jan 31, 1951, 63 y.o.   MRN: [id number] Vitamin b12 given and tolerated well

## 2014-02-27 ENCOUNTER — Other Ambulatory Visit: Payer: Self-pay | Admitting: Family Medicine

## 2014-03-12 ENCOUNTER — Other Ambulatory Visit: Payer: Self-pay | Admitting: *Deleted

## 2014-03-12 MED ORDER — LISINOPRIL-HYDROCHLOROTHIAZIDE 20-12.5 MG PO TABS: 2.0000 | ORAL_TABLET | Freq: Every day | ORAL | Status: DC

## 2014-03-12 NOTE — Telephone Encounter (Signed)
Last ov 4/15 with wong. ntbs

## 2014-03-16 ENCOUNTER — Other Ambulatory Visit: Payer: Self-pay | Admitting: Family Medicine

## 2014-03-18 NOTE — Telephone Encounter (Signed)
This may be refilled 1, please make sure the patient has appointment to get liver function tests and cholesterol checked.

## 2014-03-18 NOTE — Telephone Encounter (Signed)
Last seen and last lipid 09/17/13  FPW

## 2014-03-25 ENCOUNTER — Telehealth: Payer: Self-pay | Admitting: Family Medicine

## 2014-03-25 NOTE — Telephone Encounter (Signed)
STP, she wanted to come here to get her b12 injection but per pt has already established care with dr Modesto Charonwong at [facility], advised pt since she has already established care with dr Modesto Charonwong at the new office she should receive her b12 injection from their office

## 2014-03-30 ENCOUNTER — Other Ambulatory Visit: Payer: Self-pay | Admitting: Family Medicine

## 2014-05-20 ENCOUNTER — Other Ambulatory Visit: Payer: Self-pay | Admitting: Family Medicine

## 2015-09-15 ENCOUNTER — Telehealth: Payer: Self-pay | Admitting: Family Medicine

## 2015-09-16 NOTE — Telephone Encounter (Signed)
LM on machine - jhb

## 2015-09-16 NOTE — Telephone Encounter (Signed)
Maria NiemannJaime you may want to call her and see if you can figure out what's going on it doesn't look like I've seen this patient in a long time. It may be about a family member.

## 2016-02-20 ENCOUNTER — Ambulatory Visit
Admission: RE | Admit: 2016-02-20 | Discharge: 2016-02-20 | Disposition: A | Discharge status: Home / Self Care | Payer: BLUE CROSS/BLUE SHIELD | Source: Ambulatory Visit | Attending: Family Medicine | Admitting: Family Medicine

## 2016-02-20 ENCOUNTER — Other Ambulatory Visit: Payer: Self-pay | Admitting: Family Medicine

## 2016-02-20 DIAGNOSIS — M542 Cervicalgia: Secondary | ICD-10-CM

## 2018-07-01 ENCOUNTER — Other Ambulatory Visit: Payer: Self-pay | Admitting: Family Medicine

## 2018-07-01 DIAGNOSIS — I889 Nonspecific lymphadenitis, unspecified: Secondary | ICD-10-CM

## 2018-07-03 ENCOUNTER — Ambulatory Visit
Admission: RE | Admit: 2018-07-03 | Discharge: 2018-07-03 | Disposition: A | Discharge status: Home / Self Care | Payer: BLUE CROSS/BLUE SHIELD | Source: Ambulatory Visit | Attending: Family Medicine | Admitting: Family Medicine

## 2018-07-03 DIAGNOSIS — I889 Nonspecific lymphadenitis, unspecified: Secondary | ICD-10-CM

## 2018-09-26 ENCOUNTER — Other Ambulatory Visit: Payer: Self-pay | Admitting: Family Medicine

## 2018-09-26 DIAGNOSIS — E790 Hyperuricemia without signs of inflammatory arthritis and tophaceous disease: Secondary | ICD-10-CM

## 2018-10-05 ENCOUNTER — Ambulatory Visit
Admission: RE | Admit: 2018-10-05 | Discharge: 2018-10-05 | Disposition: A | Discharge status: Home / Self Care | Payer: Medicare Other | Source: Ambulatory Visit | Attending: Family Medicine | Admitting: Family Medicine

## 2018-10-05 DIAGNOSIS — E790 Hyperuricemia without signs of inflammatory arthritis and tophaceous disease: Secondary | ICD-10-CM

## 2019-08-23 ENCOUNTER — Ambulatory Visit: Payer: Medicare Other | Attending: Internal Medicine

## 2019-08-23 DIAGNOSIS — Z23 Encounter for immunization: Secondary | ICD-10-CM

## 2019-08-23 NOTE — Progress Notes (Signed)
   Covid-19 Vaccination Clinic  Name:  Aruna Nestler    MRN: [id number] DOB: [birth year]  08/23/2019  Ms. Holian was observed post Covid-19 immunization for 30 minutes based on pre-vaccination screening without incident. She was provided with Vaccine Information Sheet and instruction to access the V-Safe system.   Ms. Ramstad was instructed to call 911 with any severe reactions post vaccine: Marland Kitchen Difficulty breathing  . Swelling of face and throat  . A fast heartbeat  . A bad rash all over body  . Dizziness and weakness   Immunizations Administered    Name Date Dose VIS Date Route   Moderna COVID-19 Vaccine 08/23/2019  1:51 PM 0.5 mL 04/24/2019 Intramuscular   Manufacturer: Moderna   Lot: [id number]   NDC: 15488-457-33

## 2019-09-20 ENCOUNTER — Ambulatory Visit: Payer: Medicare Other | Attending: Internal Medicine

## 2019-09-20 DIAGNOSIS — Z23 Encounter for immunization: Secondary | ICD-10-CM

## 2019-09-20 NOTE — Progress Notes (Signed)
   Covid-19 Vaccination Clinic  Name:  Maria Dunlap    MRN: [id number] DOB: [birth year]  09/20/2019  [name] was observed post Covid-19 immunization for 15 minutes without incident. She was provided with Vaccine Information Sheet and instruction to access the V-Safe system.   [name] was instructed to call 911 with any severe reactions post vaccine: Marland Kitchen Difficulty breathing  . Swelling of face and throat  . A fast heartbeat  . A bad rash all over body  . Dizziness and weakness   Immunizations Administered    Name Date Dose VIS Date Route   Moderna COVID-19 Vaccine 09/20/2019 10:56 AM 0.5 mL 04/2019 Intramuscular   Manufacturer: Moderna   Lot: [id number]   NDC: 94765-465-03

## 2020-05-09 DIAGNOSIS — R7303 Prediabetes: Secondary | ICD-10-CM | POA: Insufficient documentation

## 2020-05-09 DIAGNOSIS — L309 Dermatitis, unspecified: Secondary | ICD-10-CM | POA: Insufficient documentation

## 2020-05-09 DIAGNOSIS — D510 Vitamin B12 deficiency anemia due to intrinsic factor deficiency: Secondary | ICD-10-CM | POA: Insufficient documentation

## 2020-05-09 DIAGNOSIS — R209 Unspecified disturbances of skin sensation: Secondary | ICD-10-CM | POA: Insufficient documentation

## 2020-05-09 DIAGNOSIS — G4700 Insomnia, unspecified: Secondary | ICD-10-CM | POA: Insufficient documentation

## 2020-05-12 ENCOUNTER — Other Ambulatory Visit: Payer: Self-pay | Admitting: Otolaryngology

## 2020-05-12 DIAGNOSIS — H918X9 Other specified hearing loss, unspecified ear: Secondary | ICD-10-CM

## 2020-05-12 DIAGNOSIS — H9312 Tinnitus, left ear: Secondary | ICD-10-CM

## 2020-06-03 ENCOUNTER — Other Ambulatory Visit: Payer: Self-pay

## 2020-06-03 ENCOUNTER — Ambulatory Visit
Admission: RE | Admit: 2020-06-03 | Discharge: 2020-06-03 | Disposition: A | Discharge status: Home / Self Care | Payer: Medicare Other | Source: Ambulatory Visit | Attending: Otolaryngology | Admitting: Otolaryngology

## 2020-06-03 DIAGNOSIS — H748X1 Other specified disorders of right middle ear and mastoid: Secondary | ICD-10-CM | POA: Diagnosis not present

## 2020-06-03 DIAGNOSIS — J3489 Other specified disorders of nose and nasal sinuses: Secondary | ICD-10-CM | POA: Diagnosis not present

## 2020-06-03 DIAGNOSIS — H9193 Unspecified hearing loss, bilateral: Secondary | ICD-10-CM | POA: Diagnosis not present

## 2020-06-03 DIAGNOSIS — H73891 Other specified disorders of tympanic membrane, right ear: Secondary | ICD-10-CM | POA: Diagnosis not present

## 2020-06-03 DIAGNOSIS — H918X9 Other specified hearing loss, unspecified ear: Secondary | ICD-10-CM

## 2020-06-03 DIAGNOSIS — H9312 Tinnitus, left ear: Secondary | ICD-10-CM

## 2020-06-03 MED ORDER — IOPAMIDOL (ISOVUE-300) INJECTION 61%
75.0000 mL | Freq: Once | INTRAVENOUS | Status: AC | PRN
  Administered 2020-06-03: 75 mL via INTRAVENOUS

## 2020-06-20 DIAGNOSIS — H608X3 Other otitis externa, bilateral: Secondary | ICD-10-CM | POA: Diagnosis not present

## 2020-06-20 DIAGNOSIS — H906 Mixed conductive and sensorineural hearing loss, bilateral: Secondary | ICD-10-CM | POA: Diagnosis not present

## 2020-06-25 DIAGNOSIS — E538 Deficiency of other specified B group vitamins: Secondary | ICD-10-CM | POA: Diagnosis not present

## 2020-07-09 DIAGNOSIS — E785 Hyperlipidemia, unspecified: Secondary | ICD-10-CM | POA: Diagnosis not present

## 2020-07-09 DIAGNOSIS — D649 Anemia, unspecified: Secondary | ICD-10-CM | POA: Diagnosis not present

## 2020-07-09 DIAGNOSIS — D510 Vitamin B12 deficiency anemia due to intrinsic factor deficiency: Secondary | ICD-10-CM | POA: Diagnosis not present

## 2020-07-09 DIAGNOSIS — I10 Essential (primary) hypertension: Secondary | ICD-10-CM | POA: Diagnosis not present

## 2020-07-09 DIAGNOSIS — G4700 Insomnia, unspecified: Secondary | ICD-10-CM | POA: Diagnosis not present

## 2020-07-31 DIAGNOSIS — E538 Deficiency of other specified B group vitamins: Secondary | ICD-10-CM | POA: Diagnosis not present

## 2020-07-31 DIAGNOSIS — H905 Unspecified sensorineural hearing loss: Secondary | ICD-10-CM | POA: Diagnosis not present

## 2020-08-05 DIAGNOSIS — E785 Hyperlipidemia, unspecified: Secondary | ICD-10-CM | POA: Diagnosis not present

## 2020-08-05 DIAGNOSIS — D510 Vitamin B12 deficiency anemia due to intrinsic factor deficiency: Secondary | ICD-10-CM | POA: Diagnosis not present

## 2020-08-05 DIAGNOSIS — I10 Essential (primary) hypertension: Secondary | ICD-10-CM | POA: Diagnosis not present

## 2020-08-05 DIAGNOSIS — G4700 Insomnia, unspecified: Secondary | ICD-10-CM | POA: Diagnosis not present

## 2020-08-05 DIAGNOSIS — D649 Anemia, unspecified: Secondary | ICD-10-CM | POA: Diagnosis not present

## 2020-08-07 DIAGNOSIS — E538 Deficiency of other specified B group vitamins: Secondary | ICD-10-CM | POA: Diagnosis not present

## 2020-08-07 DIAGNOSIS — D649 Anemia, unspecified: Secondary | ICD-10-CM | POA: Diagnosis not present

## 2020-08-07 DIAGNOSIS — E785 Hyperlipidemia, unspecified: Secondary | ICD-10-CM | POA: Diagnosis not present

## 2020-08-21 DIAGNOSIS — D649 Anemia, unspecified: Secondary | ICD-10-CM | POA: Diagnosis not present

## 2020-08-21 DIAGNOSIS — R739 Hyperglycemia, unspecified: Secondary | ICD-10-CM | POA: Diagnosis not present

## 2020-08-21 DIAGNOSIS — E538 Deficiency of other specified B group vitamins: Secondary | ICD-10-CM | POA: Diagnosis not present

## 2020-08-21 DIAGNOSIS — M109 Gout, unspecified: Secondary | ICD-10-CM | POA: Diagnosis not present

## 2020-08-21 DIAGNOSIS — I10 Essential (primary) hypertension: Secondary | ICD-10-CM | POA: Diagnosis not present

## 2020-08-21 DIAGNOSIS — R7401 Elevation of levels of liver transaminase levels: Secondary | ICD-10-CM | POA: Diagnosis not present

## 2020-08-21 DIAGNOSIS — E785 Hyperlipidemia, unspecified: Secondary | ICD-10-CM | POA: Diagnosis not present

## 2020-10-01 DIAGNOSIS — G4700 Insomnia, unspecified: Secondary | ICD-10-CM | POA: Diagnosis not present

## 2020-10-01 DIAGNOSIS — I10 Essential (primary) hypertension: Secondary | ICD-10-CM | POA: Diagnosis not present

## 2020-10-01 DIAGNOSIS — E785 Hyperlipidemia, unspecified: Secondary | ICD-10-CM | POA: Diagnosis not present

## 2020-10-01 DIAGNOSIS — D510 Vitamin B12 deficiency anemia due to intrinsic factor deficiency: Secondary | ICD-10-CM | POA: Diagnosis not present

## 2020-10-01 DIAGNOSIS — D649 Anemia, unspecified: Secondary | ICD-10-CM | POA: Diagnosis not present

## 2020-10-06 DIAGNOSIS — E538 Deficiency of other specified B group vitamins: Secondary | ICD-10-CM | POA: Diagnosis not present

## 2020-10-21 DIAGNOSIS — R7401 Elevation of levels of liver transaminase levels: Secondary | ICD-10-CM | POA: Diagnosis not present

## 2020-10-21 DIAGNOSIS — R7989 Other specified abnormal findings of blood chemistry: Secondary | ICD-10-CM | POA: Diagnosis not present

## 2020-10-21 DIAGNOSIS — E785 Hyperlipidemia, unspecified: Secondary | ICD-10-CM | POA: Diagnosis not present

## 2020-10-21 DIAGNOSIS — E790 Hyperuricemia without signs of inflammatory arthritis and tophaceous disease: Secondary | ICD-10-CM | POA: Diagnosis not present

## 2020-10-21 DIAGNOSIS — I10 Essential (primary) hypertension: Secondary | ICD-10-CM | POA: Diagnosis not present

## 2020-11-11 DIAGNOSIS — E538 Deficiency of other specified B group vitamins: Secondary | ICD-10-CM | POA: Diagnosis not present

## 2020-12-02 DIAGNOSIS — G4700 Insomnia, unspecified: Secondary | ICD-10-CM | POA: Diagnosis not present

## 2020-12-02 DIAGNOSIS — I10 Essential (primary) hypertension: Secondary | ICD-10-CM | POA: Diagnosis not present

## 2020-12-02 DIAGNOSIS — D510 Vitamin B12 deficiency anemia due to intrinsic factor deficiency: Secondary | ICD-10-CM | POA: Diagnosis not present

## 2020-12-02 DIAGNOSIS — E785 Hyperlipidemia, unspecified: Secondary | ICD-10-CM | POA: Diagnosis not present

## 2020-12-02 DIAGNOSIS — D649 Anemia, unspecified: Secondary | ICD-10-CM | POA: Diagnosis not present

## 2020-12-04 DIAGNOSIS — E785 Hyperlipidemia, unspecified: Secondary | ICD-10-CM | POA: Diagnosis not present

## 2020-12-04 DIAGNOSIS — R7401 Elevation of levels of liver transaminase levels: Secondary | ICD-10-CM | POA: Diagnosis not present

## 2020-12-04 DIAGNOSIS — Z13820 Encounter for screening for osteoporosis: Secondary | ICD-10-CM | POA: Diagnosis not present

## 2020-12-04 DIAGNOSIS — E790 Hyperuricemia without signs of inflammatory arthritis and tophaceous disease: Secondary | ICD-10-CM | POA: Diagnosis not present

## 2020-12-04 DIAGNOSIS — R7989 Other specified abnormal findings of blood chemistry: Secondary | ICD-10-CM | POA: Diagnosis not present

## 2020-12-04 DIAGNOSIS — I10 Essential (primary) hypertension: Secondary | ICD-10-CM | POA: Diagnosis not present

## 2020-12-04 DIAGNOSIS — R29890 Loss of height: Secondary | ICD-10-CM | POA: Diagnosis not present

## 2020-12-04 DIAGNOSIS — Z0000 Encounter for general adult medical examination without abnormal findings: Secondary | ICD-10-CM | POA: Diagnosis not present

## 2020-12-08 ENCOUNTER — Other Ambulatory Visit: Payer: Self-pay | Admitting: Family Medicine

## 2020-12-08 DIAGNOSIS — E2839 Other primary ovarian failure: Secondary | ICD-10-CM

## 2020-12-15 DIAGNOSIS — H906 Mixed conductive and sensorineural hearing loss, bilateral: Secondary | ICD-10-CM | POA: Diagnosis not present

## 2020-12-15 DIAGNOSIS — H608X3 Other otitis externa, bilateral: Secondary | ICD-10-CM | POA: Diagnosis not present

## 2020-12-23 DIAGNOSIS — E538 Deficiency of other specified B group vitamins: Secondary | ICD-10-CM | POA: Diagnosis not present

## 2021-01-23 DIAGNOSIS — E538 Deficiency of other specified B group vitamins: Secondary | ICD-10-CM | POA: Diagnosis not present

## 2021-02-05 DIAGNOSIS — G4700 Insomnia, unspecified: Secondary | ICD-10-CM | POA: Diagnosis not present

## 2021-02-05 DIAGNOSIS — I10 Essential (primary) hypertension: Secondary | ICD-10-CM | POA: Diagnosis not present

## 2021-02-05 DIAGNOSIS — D649 Anemia, unspecified: Secondary | ICD-10-CM | POA: Diagnosis not present

## 2021-02-05 DIAGNOSIS — D510 Vitamin B12 deficiency anemia due to intrinsic factor deficiency: Secondary | ICD-10-CM | POA: Diagnosis not present

## 2021-02-05 DIAGNOSIS — E785 Hyperlipidemia, unspecified: Secondary | ICD-10-CM | POA: Diagnosis not present

## 2021-02-24 DIAGNOSIS — E538 Deficiency of other specified B group vitamins: Secondary | ICD-10-CM | POA: Diagnosis not present

## 2021-03-25 DIAGNOSIS — E538 Deficiency of other specified B group vitamins: Secondary | ICD-10-CM | POA: Diagnosis not present

## 2021-04-27 DIAGNOSIS — E538 Deficiency of other specified B group vitamins: Secondary | ICD-10-CM | POA: Diagnosis not present

## 2021-05-26 ENCOUNTER — Encounter: Payer: Self-pay | Admitting: Cardiology

## 2021-05-26 DIAGNOSIS — D7589 Other specified diseases of blood and blood-forming organs: Secondary | ICD-10-CM | POA: Diagnosis not present

## 2021-05-26 DIAGNOSIS — R000 Tachycardia, unspecified: Secondary | ICD-10-CM | POA: Diagnosis not present

## 2021-05-26 DIAGNOSIS — E538 Deficiency of other specified B group vitamins: Secondary | ICD-10-CM | POA: Diagnosis not present

## 2021-05-26 DIAGNOSIS — R079 Chest pain, unspecified: Secondary | ICD-10-CM | POA: Diagnosis not present

## 2021-06-04 ENCOUNTER — Other Ambulatory Visit: Payer: Self-pay

## 2021-06-04 ENCOUNTER — Ambulatory Visit
Admission: RE | Admit: 2021-06-04 | Discharge: 2021-06-04 | Disposition: A | Discharge status: Home / Self Care | Payer: Medicare Other | Source: Ambulatory Visit | Attending: Family Medicine | Admitting: Family Medicine

## 2021-06-04 DIAGNOSIS — E2839 Other primary ovarian failure: Secondary | ICD-10-CM

## 2021-06-04 DIAGNOSIS — M8589 Other specified disorders of bone density and structure, multiple sites: Secondary | ICD-10-CM | POA: Diagnosis not present

## 2021-06-04 DIAGNOSIS — Z780 Asymptomatic menopausal state: Secondary | ICD-10-CM | POA: Diagnosis not present

## 2021-06-09 DIAGNOSIS — R0789 Other chest pain: Secondary | ICD-10-CM | POA: Diagnosis not present

## 2021-06-09 DIAGNOSIS — R739 Hyperglycemia, unspecified: Secondary | ICD-10-CM | POA: Diagnosis not present

## 2021-06-09 DIAGNOSIS — E785 Hyperlipidemia, unspecified: Secondary | ICD-10-CM | POA: Diagnosis not present

## 2021-06-09 DIAGNOSIS — I10 Essential (primary) hypertension: Secondary | ICD-10-CM | POA: Diagnosis not present

## 2021-06-09 DIAGNOSIS — R7303 Prediabetes: Secondary | ICD-10-CM | POA: Diagnosis not present

## 2021-06-09 DIAGNOSIS — M109 Gout, unspecified: Secondary | ICD-10-CM | POA: Diagnosis not present

## 2021-06-09 DIAGNOSIS — Z23 Encounter for immunization: Secondary | ICD-10-CM | POA: Diagnosis not present

## 2021-06-09 DIAGNOSIS — R7401 Elevation of levels of liver transaminase levels: Secondary | ICD-10-CM | POA: Diagnosis not present

## 2021-06-09 DIAGNOSIS — Z789 Other specified health status: Secondary | ICD-10-CM | POA: Diagnosis not present

## 2021-06-12 DIAGNOSIS — K7689 Other specified diseases of liver: Secondary | ICD-10-CM | POA: Diagnosis not present

## 2021-06-12 DIAGNOSIS — R17 Unspecified jaundice: Secondary | ICD-10-CM | POA: Diagnosis not present

## 2021-07-17 ENCOUNTER — Encounter: Payer: Self-pay | Admitting: *Deleted

## 2021-07-21 ENCOUNTER — Encounter: Payer: Self-pay | Admitting: Cardiology

## 2021-07-21 NOTE — Progress Notes (Signed)
? ? ?Cardiology Office Note ? ?Date: 07/22/2021  ? ?Maria Dunlap, DOB 1950-10-04, MRN [id number] ? ?PCP:  Ileana Ladd, MD  ?Cardiologist:  Nona Dell, MD ?Electrophysiologist:  None  ? ?Chief Complaint  ?Patient presents with  ? Chest pain  ? ? ?History of Present Illness: ?Maria Dunlap is a 71 y.o. female referred for cardiology consultation by Dr. Modesto Charon for the evaluation of chest pain.  She tells me that she had a 2-day episode of left arm aching, intermittent chest pressure, and pain between her upper shoulder blades back in October of last year.  No obvious precipitant, but she was under a significant amount of stress, states that she found her brother dead at home around that time.  The symptoms ultimately resolved and have not recurred since then.  I reviewed her lab work from January as well as ECG, results detailed below. ? ?She has a history of hypertension and prediabetes.  No known cardiovascular disease, has not undergone any prior ischemic evaluation. ? ?I reviewed her medications, she reports compliance with therapy. ? ? ?Past Medical History:  ?Diagnosis Date  ? Eczema   ? Essential hypertension   ? Gout   ? Hyperlipidemia   ? Menopause   ? Prediabetes   ? Statin intolerance   ? ? ?Past Surgical History:  ?Procedure Laterality Date  ? ABDOMINAL HYSTERECTOMY    ? ? ?Current Outpatient Medications  ?Medication Sig Dispense Refill  ? amLODipine (NORVASC) 5 MG tablet Take 5 mg by mouth daily.    ? cloNIDine (CATAPRES) 0.1 MG tablet Take 1 tablet by mouth 2 (two) times daily.    ? ezetimibe (ZETIA) 10 MG tablet Take 10 mg by mouth daily.    ? febuxostat (ULORIC) 40 MG tablet Take 40 mg by mouth daily.    ? fish oil-omega-3 fatty acids 1000 MG capsule Take 4 g by mouth daily.    ? hydrochlorothiazide (MICROZIDE) 12.5 MG capsule Take 12.5 mg by mouth every morning.    ? lisinopril (ZESTRIL) 20 MG tablet Take 20 mg by mouth 2 (two) times daily.    ? pantoprazole (PROTONIX) 40 MG tablet  Take 1 tablet by mouth every evening.    ? ?No current facility-administered medications for this visit.  ? ?Allergies:  Allopurinol, Amoxicillin, Crestor [rosuvastatin], Macrolides and ketolides, Mycinette [phenol], Simvastatin, and Triamcinolone  ? ?Social History: The patient  reports that she quit smoking about 29 years ago. Her smoking use included cigarettes. She does not have any smokeless tobacco history on file. She reports current alcohol use. She reports that she does not use drugs.  ? ?Family History: The patient's family history includes COPD in her brother; Cancer in her brother; Heart disease in her father and mother; Hypertension in her mother; Prostate cancer in her brother; Stroke in her father.  ? ?ROS: No palpitations or syncope. ? ?Physical Exam: ?VS:  BP 108/62   Pulse (!) 58   Ht 4\' 9"  (1.448 m)   Wt 135 lb 9.6 oz (61.5 kg)   SpO2 98%   BMI 29.34 kg/m? , BMI Body mass index is 29.34 kg/m?. ? ?Wt Readings from Last 3 Encounters:  ?07/22/21 135 lb 9.6 oz (61.5 kg)  ?09/17/13 168 lb 6.4 oz (76.4 kg)  ?08/13/13 169 lb (76.7 kg)  ?  ?General: Patient appears comfortable at rest. ?HEENT: Conjunctiva and lids normal, wearing a mask. ?Neck: Supple, no elevated JVP or carotid bruits, no thyromegaly. ?Lungs: Clear to auscultation,  nonlabored breathing at rest. ?Cardiac: Regular rate and rhythm, no S3 or significant systolic murmur, no pericardial rub. ?Abdomen: Soft, nontender, bowel sounds present. ?Extremities: No pitting edema, distal pulses 2+. ?Skin: Warm and dry. ?Musculoskeletal: No kyphosis. ?Neuropsychiatric: Alert and oriented x3, affect grossly appropriate. ? ?ECG:  An ECG dated 05/26/2021 was personally reviewed today and demonstrated:  Sinus rhythm with left anterior fascicular block and nonspecific T wave abnormalities. ? ?Recent Labwork: ? ?January 2023: Troponin T 7, TSH 3.38, hemoglobin 13.4, platelets 158, lipase 54, BUN 13, creatinine 0.87, potassium 4.1, AST 38, ALT 30 ? ?Other  Studies Reviewed Today: ? ?No prior cardiac testing for review today. ? ?Assessment and Plan: ? ?1.  History of chest pain with features concerning for angina in a 71 year old woman with hypertension, prediabetes, and family history of heart disease in her parents.  ECG shows left anterior fascicular block and nonspecific T wave abnormalities at baseline.  She does not report escalating symptoms, but has not undergone any prior ischemic work-up.  We will plan a Lexiscan Myoview for further evaluation. ? ?2.  Essential hypertension, blood pressure well controlled on current regimen including Norvasc, clonidine, hydrochlorothiazide, and lisinopril. ? ?Medication Adjustments/Labs and Tests Ordered: ?Current medicines are reviewed at length with the patient today.  Concerns regarding medicines are outlined above.  ? ?Tests Ordered: ?Orders Placed This Encounter  ?Procedures  ? NM Myocar Multi W/Spect W/Wall Motion / EF  ? ? ?Medication Changes: ?No orders of the defined types were placed in this encounter. ? ? ?Disposition:  Follow up  test results. ? ?Signed, ?Satira Sark, MD, [facility] ?07/22/2021 9:33 AM    ?[facility] ?[address], [place], [place] [address] ?Phone: [phone]; Fax: [phone]  ?

## 2021-07-22 ENCOUNTER — Telehealth: Payer: Self-pay | Admitting: Cardiology

## 2021-07-22 ENCOUNTER — Ambulatory Visit: Payer: Medicare Other | Admitting: Cardiology

## 2021-07-22 ENCOUNTER — Encounter: Payer: Self-pay | Admitting: Cardiology

## 2021-07-22 VITALS — BP 108/62 | HR 58 | Ht <= 58 in | Wt 135.6 lb

## 2021-07-22 DIAGNOSIS — Z8249 Family history of ischemic heart disease and other diseases of the circulatory system: Secondary | ICD-10-CM

## 2021-07-22 DIAGNOSIS — R7303 Prediabetes: Secondary | ICD-10-CM

## 2021-07-22 DIAGNOSIS — R9431 Abnormal electrocardiogram [ECG] [EKG]: Secondary | ICD-10-CM

## 2021-07-22 DIAGNOSIS — I209 Angina pectoris, unspecified: Secondary | ICD-10-CM | POA: Diagnosis not present

## 2021-07-22 DIAGNOSIS — R0789 Other chest pain: Secondary | ICD-10-CM

## 2021-07-22 DIAGNOSIS — I10 Essential (primary) hypertension: Secondary | ICD-10-CM

## 2021-07-22 NOTE — Patient Instructions (Signed)
Medication Instructions:  °Your physician recommends that you continue on your current medications as directed. Please refer to the Current Medication list given to you today.  ° °Labwork: °none ° °Testing/Procedures: °Your physician has requested that you have a lexiscan myoview. For further information please visit [email or web address]. Please follow instruction sheet, as given.  ° °Follow-Up: °Your physician recommends that you schedule a follow-up appointment in: Follow up pending  ° °Any Other Special Instructions Will Be Listed Below (If Applicable). ° °If you need a refill on your cardiac medications before your next appointment, please call your pharmacy. ° °

## 2021-07-22 NOTE — Telephone Encounter (Signed)
Checking percert on the following patient for testing scheduled at [facility].   ? ? ?LEXISCAN   08/04/2021 ?

## 2021-08-04 ENCOUNTER — Encounter (HOSPITAL_COMMUNITY): Payer: Medicare Other

## 2022-01-09 DIAGNOSIS — D539 Nutritional anemia, unspecified: Secondary | ICD-10-CM | POA: Insufficient documentation

## 2022-01-09 DIAGNOSIS — R410 Disorientation, unspecified: Secondary | ICD-10-CM | POA: Insufficient documentation

## 2022-01-09 DIAGNOSIS — D649 Anemia, unspecified: Secondary | ICD-10-CM | POA: Insufficient documentation

## 2022-01-10 DIAGNOSIS — F1010 Alcohol abuse, uncomplicated: Secondary | ICD-10-CM | POA: Insufficient documentation

## 2022-01-11 DIAGNOSIS — K7460 Unspecified cirrhosis of liver: Secondary | ICD-10-CM | POA: Insufficient documentation

## 2022-01-13 DIAGNOSIS — D62 Acute posthemorrhagic anemia: Secondary | ICD-10-CM | POA: Insufficient documentation

## 2022-01-22 ENCOUNTER — Emergency Department (HOSPITAL_COMMUNITY): Payer: Medicare Other

## 2022-01-22 ENCOUNTER — Encounter (HOSPITAL_COMMUNITY): Payer: Self-pay

## 2022-01-22 ENCOUNTER — Other Ambulatory Visit: Payer: Self-pay

## 2022-01-22 ENCOUNTER — Inpatient Hospital Stay (HOSPITAL_COMMUNITY): Payer: Medicare Other

## 2022-01-22 ENCOUNTER — Inpatient Hospital Stay (HOSPITAL_COMMUNITY)
Admission: EM | Admit: 2022-01-22 | Discharge: 2022-01-29 | DRG: 640 | Disposition: A | Discharge status: Skilled Nursing Facility | Payer: Medicare Other | Source: Skilled Nursing Facility | Attending: Internal Medicine | Admitting: Internal Medicine

## 2022-01-22 DIAGNOSIS — R4182 Altered mental status, unspecified: Secondary | ICD-10-CM | POA: Diagnosis not present

## 2022-01-22 DIAGNOSIS — Z8042 Family history of malignant neoplasm of prostate: Secondary | ICD-10-CM

## 2022-01-22 DIAGNOSIS — Z825 Family history of asthma and other chronic lower respiratory diseases: Secondary | ICD-10-CM

## 2022-01-22 DIAGNOSIS — E871 Hypo-osmolality and hyponatremia: Secondary | ICD-10-CM | POA: Diagnosis not present

## 2022-01-22 DIAGNOSIS — R1310 Dysphagia, unspecified: Secondary | ICD-10-CM | POA: Diagnosis present

## 2022-01-22 DIAGNOSIS — Z7401 Bed confinement status: Secondary | ICD-10-CM | POA: Diagnosis not present

## 2022-01-22 DIAGNOSIS — B952 Enterococcus as the cause of diseases classified elsewhere: Secondary | ICD-10-CM | POA: Diagnosis present

## 2022-01-22 DIAGNOSIS — Z6829 Body mass index (BMI) 29.0-29.9, adult: Secondary | ICD-10-CM

## 2022-01-22 DIAGNOSIS — M6250 Muscle wasting and atrophy, not elsewhere classified, unspecified site: Secondary | ICD-10-CM | POA: Diagnosis present

## 2022-01-22 DIAGNOSIS — R627 Adult failure to thrive: Secondary | ICD-10-CM | POA: Diagnosis present

## 2022-01-22 DIAGNOSIS — E860 Dehydration: Secondary | ICD-10-CM | POA: Diagnosis present

## 2022-01-22 DIAGNOSIS — G9340 Encephalopathy, unspecified: Secondary | ICD-10-CM | POA: Diagnosis not present

## 2022-01-22 DIAGNOSIS — E785 Hyperlipidemia, unspecified: Secondary | ICD-10-CM | POA: Diagnosis present

## 2022-01-22 DIAGNOSIS — R410 Disorientation, unspecified: Secondary | ICD-10-CM

## 2022-01-22 DIAGNOSIS — Z87891 Personal history of nicotine dependence: Secondary | ICD-10-CM

## 2022-01-22 DIAGNOSIS — N390 Urinary tract infection, site not specified: Secondary | ICD-10-CM | POA: Diagnosis present

## 2022-01-22 DIAGNOSIS — E876 Hypokalemia: Secondary | ICD-10-CM

## 2022-01-22 DIAGNOSIS — I10 Essential (primary) hypertension: Secondary | ICD-10-CM | POA: Diagnosis present

## 2022-01-22 DIAGNOSIS — Z79899 Other long term (current) drug therapy: Secondary | ICD-10-CM

## 2022-01-22 DIAGNOSIS — E870 Hyperosmolality and hypernatremia: Principal | ICD-10-CM | POA: Diagnosis present

## 2022-01-22 DIAGNOSIS — Z515 Encounter for palliative care: Secondary | ICD-10-CM | POA: Diagnosis not present

## 2022-01-22 DIAGNOSIS — R4189 Other symptoms and signs involving cognitive functions and awareness: Secondary | ICD-10-CM | POA: Diagnosis present

## 2022-01-22 DIAGNOSIS — M109 Gout, unspecified: Secondary | ICD-10-CM | POA: Diagnosis present

## 2022-01-22 DIAGNOSIS — Z823 Family history of stroke: Secondary | ICD-10-CM

## 2022-01-22 DIAGNOSIS — Z634 Disappearance and death of family member: Secondary | ICD-10-CM

## 2022-01-22 DIAGNOSIS — Z888 Allergy status to other drugs, medicaments and biological substances status: Secondary | ICD-10-CM

## 2022-01-22 DIAGNOSIS — R7303 Prediabetes: Secondary | ICD-10-CM | POA: Diagnosis present

## 2022-01-22 DIAGNOSIS — G9341 Metabolic encephalopathy: Secondary | ICD-10-CM | POA: Diagnosis present

## 2022-01-22 DIAGNOSIS — Z8249 Family history of ischemic heart disease and other diseases of the circulatory system: Secondary | ICD-10-CM | POA: Diagnosis not present

## 2022-01-22 DIAGNOSIS — K219 Gastro-esophageal reflux disease without esophagitis: Secondary | ICD-10-CM | POA: Diagnosis present

## 2022-01-22 DIAGNOSIS — F10288 Alcohol dependence with other alcohol-induced disorder: Secondary | ICD-10-CM | POA: Diagnosis present

## 2022-01-22 DIAGNOSIS — E538 Deficiency of other specified B group vitamins: Secondary | ICD-10-CM | POA: Diagnosis present

## 2022-01-22 DIAGNOSIS — Z881 Allergy status to other antibiotic agents status: Secondary | ICD-10-CM

## 2022-01-22 DIAGNOSIS — D638 Anemia in other chronic diseases classified elsewhere: Secondary | ICD-10-CM | POA: Diagnosis present

## 2022-01-22 DIAGNOSIS — Z7189 Other specified counseling: Secondary | ICD-10-CM | POA: Diagnosis not present

## 2022-01-22 DIAGNOSIS — R9431 Abnormal electrocardiogram [ECG] [EKG]: Secondary | ICD-10-CM | POA: Diagnosis present

## 2022-01-22 LAB — VITAMIN B12: Vitamin B-12: 770 pg/mL (ref 180–914)

## 2022-01-22 LAB — AMMONIA: Ammonia: 23 umol/L (ref 9–35)

## 2022-01-22 LAB — CBC WITH DIFFERENTIAL/PLATELET
Abs Immature Granulocytes: 0.1 10*3/uL — ABNORMAL HIGH (ref 0.00–0.07)
Basophils Absolute: 0.1 10*3/uL (ref 0.0–0.1)
Basophils Relative: 1 %
Eosinophils Absolute: 0 10*3/uL (ref 0.0–0.5)
Eosinophils Relative: 0 %
HCT: 27.1 % — ABNORMAL LOW (ref 36.0–46.0)
Hemoglobin: 8.8 g/dL — ABNORMAL LOW (ref 12.0–15.0)
Immature Granulocytes: 1 %
Lymphocytes Relative: 14 %
Lymphs Abs: 1.5 10*3/uL (ref 0.7–4.0)
MCH: 32.8 pg (ref 26.0–34.0)
MCHC: 32.5 g/dL (ref 30.0–36.0)
MCV: 101.1 fL — ABNORMAL HIGH (ref 80.0–100.0)
Monocytes Absolute: 0.7 10*3/uL (ref 0.1–1.0)
Monocytes Relative: 6 %
Neutro Abs: 8.4 10*3/uL — ABNORMAL HIGH (ref 1.7–7.7)
Neutrophils Relative %: 78 %
Platelets: 174 10*3/uL (ref 150–400)
RBC: 2.68 MIL/uL — ABNORMAL LOW (ref 3.87–5.11)
RDW: 19.9 % — ABNORMAL HIGH (ref 11.5–15.5)
WBC: 10.8 10*3/uL — ABNORMAL HIGH (ref 4.0–10.5)
nRBC: 0 % (ref 0.0–0.2)

## 2022-01-22 LAB — BASIC METABOLIC PANEL
Anion gap: 7 (ref 5–15)
BUN: 20 mg/dL (ref 8–23)
CO2: 18 mmol/L — ABNORMAL LOW (ref 22–32)
Calcium: 8.4 mg/dL — ABNORMAL LOW (ref 8.9–10.3)
Chloride: 124 mmol/L — ABNORMAL HIGH (ref 98–111)
Creatinine, Ser: 0.99 mg/dL (ref 0.44–1.00)
GFR, Estimated: >60 mL/min (ref >60)
Glucose, Bld: 98 mg/dL (ref 70–99)
Potassium: 2.8 mmol/L — ABNORMAL LOW (ref 3.5–5.1)
Sodium: 149 mmol/L — ABNORMAL HIGH (ref 135–145)

## 2022-01-22 LAB — MAGNESIUM: Magnesium: 2.1 mg/dL (ref 1.7–2.4)

## 2022-01-22 LAB — COMPREHENSIVE METABOLIC PANEL
ALT: 22 U/L (ref 0–44)
AST: 38 U/L (ref 15–41)
Albumin: 3 g/dL — ABNORMAL LOW (ref 3.5–5.0)
Alkaline Phosphatase: 63 U/L (ref 38–126)
Anion gap: 8 (ref 5–15)
BUN: 21 mg/dL (ref 8–23)
CO2: 19 mmol/L — ABNORMAL LOW (ref 22–32)
Calcium: 8.9 mg/dL (ref 8.9–10.3)
Chloride: 124 mmol/L — ABNORMAL HIGH (ref 98–111)
Creatinine, Ser: 0.95 mg/dL (ref 0.44–1.00)
GFR, Estimated: >60 mL/min (ref >60)
Glucose, Bld: 100 mg/dL — ABNORMAL HIGH (ref 70–99)
Potassium: 2.5 mmol/L — CL (ref 3.5–5.1)
Sodium: 151 mmol/L — ABNORMAL HIGH (ref 135–145)
Total Bilirubin: 1.7 mg/dL — ABNORMAL HIGH (ref 0.3–1.2)
Total Protein: 6.2 g/dL — ABNORMAL LOW (ref 6.5–8.1)

## 2022-01-22 LAB — URINALYSIS, MICROSCOPIC (REFLEX): Squamous Epithelial / HPF: >50 (ref 0–5)

## 2022-01-22 LAB — URINALYSIS, ROUTINE W REFLEX MICROSCOPIC
Bilirubin Urine: NEGATIVE
Glucose, UA: NEGATIVE mg/dL
Ketones, ur: NEGATIVE mg/dL
Leukocytes,Ua: NEGATIVE
Nitrite: NEGATIVE
Specific Gravity, Urine: 1.02 (ref 1.005–1.030)
pH: 6 (ref 5.0–8.0)

## 2022-01-22 LAB — ETHANOL: Alcohol, Ethyl (B): <10 mg/dL (ref <10)

## 2022-01-22 LAB — PROTIME-INR
INR: 1.2 (ref 0.8–1.2)
Prothrombin Time: 14.6 seconds (ref 11.4–15.2)

## 2022-01-22 LAB — FOLATE: Folate: 9.6 ng/mL (ref >5.9)

## 2022-01-22 LAB — LIPASE, BLOOD: Lipase: 41 U/L (ref 11–51)

## 2022-01-22 MED ORDER — ACETAMINOPHEN 650 MG RE SUPP: 650.0000 mg | Freq: Four times a day (QID) | RECTAL | Status: DC | PRN

## 2022-01-22 MED ORDER — VITAMIN B-12 100 MCG PO TABS
500.0000 ug | ORAL_TABLET | Freq: Every day | ORAL | Status: DC
  Administered 2022-01-22 – 2022-01-29 (×8): 500 ug via ORAL
  Filled 2022-01-22 (×7): qty 5

## 2022-01-22 MED ORDER — POTASSIUM CHLORIDE 10 MEQ/100ML IV SOLN
10.0000 meq | INTRAVENOUS | Status: AC
  Administered 2022-01-22 – 2022-01-23 (×4): 10 meq via INTRAVENOUS
  Filled 2022-01-22 (×2): qty 100

## 2022-01-22 MED ORDER — LACTATED RINGERS IV BOLUS
1000.0000 mL | Freq: Once | INTRAVENOUS | Status: AC
  Administered 2022-01-22: 1000 mL via INTRAVENOUS

## 2022-01-22 MED ORDER — ENOXAPARIN SODIUM 40 MG/0.4ML IJ SOSY
40.0000 mg | PREFILLED_SYRINGE | INTRAMUSCULAR | Status: DC
  Administered 2022-01-22 – 2022-01-28 (×7): 40 mg via SUBCUTANEOUS
  Filled 2022-01-22 (×7): qty 0.4

## 2022-01-22 MED ORDER — POTASSIUM CHLORIDE 10 MEQ/100ML IV SOLN
10.0000 meq | INTRAVENOUS | Status: AC
  Administered 2022-01-22 (×2): 10 meq via INTRAVENOUS
  Filled 2022-01-22 (×2): qty 100

## 2022-01-22 MED ORDER — MAGNESIUM SULFATE 2 GM/50ML IV SOLN
2.0000 g | Freq: Once | INTRAVENOUS | Status: AC
  Administered 2022-01-22: 2 g via INTRAVENOUS
  Filled 2022-01-22: qty 50

## 2022-01-22 MED ORDER — ACETAMINOPHEN 325 MG PO TABS
650.0000 mg | ORAL_TABLET | Freq: Four times a day (QID) | ORAL | Status: DC | PRN
  Administered 2022-01-23: 650 mg via ORAL
  Filled 2022-01-22: qty 2

## 2022-01-22 MED ORDER — THIAMINE HCL 100 MG/ML IJ SOLN
500.0000 mg | INTRAVENOUS | Status: AC
  Administered 2022-01-22 – 2022-01-26 (×5): 500 mg via INTRAVENOUS
  Filled 2022-01-22 (×6): qty 5

## 2022-01-22 MED ORDER — POTASSIUM CHLORIDE CRYS ER 20 MEQ PO TBCR
40.0000 meq | EXTENDED_RELEASE_TABLET | Freq: Four times a day (QID) | ORAL | Status: AC
  Administered 2022-01-22 – 2022-01-23 (×2): 40 meq via ORAL
  Filled 2022-01-22 (×2): qty 2

## 2022-01-22 MED ORDER — THIAMINE HCL 100 MG/ML IJ SOLN
INTRAMUSCULAR | Status: AC
  Filled 2022-01-22: qty 6

## 2022-01-22 MED ORDER — SODIUM CHLORIDE 0.9 % IV SOLN: INTRAVENOUS | Status: DC

## 2022-01-22 NOTE — ED Triage Notes (Signed)
Pt to er via ems, per ems pt is from [place] rehab, states that she was recently d/c from [facility], states that she is here today because she isn't eating or drinking, pt in bed, pt eyes open, pt looking around, pt states that her name is Maria Dunlap, pt isn't answering any other questions.

## 2022-01-22 NOTE — ED Provider Notes (Signed)
[facility] EMERGENCY DEPARTMENT Provider Note  CSN: [id number] Arrival date & time: 01/22/22 1432  Chief Complaint(s) Altered Mental Status  HPI Maria Dunlap is a 71 y.o. female with history of hypertension, hyperlipidemia, alcohol abuse presenting to the emergency department with altered mental status.  Patient was recently admitted to [facility] with altered mental status, was found to be hyponatremic to 112, electrolytes were improved and she was discharged to rehab hospital.  She was also treated with vancomycin for Enterococcus UTI.  Family reports since discharge she has remained persistently confused, not eating or drinking.  She has been at [facility].  No reported fevers at rehab.  Family report that her mental status is similar to when she was in the hospital.  History otherwise unobtainable due to altered mental status   Past Medical History Past Medical History:  Diagnosis Date   Eczema    Essential hypertension    Gout    Hyperlipidemia    Menopause    Prediabetes    Statin intolerance    Patient Active Problem List   Diagnosis Date Noted   Hypernatremia 01/22/2022   Menopause    Symptoms, such as flushing, sleeplessness, headache, lack of concentration, associated with the menopause 01/08/2013   Routine gynecological examination 11/27/2012   HTN (hypertension) 10/23/2012   HLD (hyperlipidemia) 10/23/2012   B12 deficiency 10/23/2012   Post-menopause on HRT (hormone replacement therapy) 10/23/2012   Home Medication(s) Prior to Admission medications   Medication Sig Start Date End Date Taking? Authorizing Provider  amLODipine (NORVASC) 5 MG tablet Take 5 mg by mouth daily. 06/19/21   [provider]  cloNIDine (CATAPRES) 0.1 MG tablet Take 1 tablet by mouth 2 (two) times daily. 03/01/14   [provider]  ezetimibe (ZETIA) 10 MG tablet Take 10 mg by mouth daily. 06/19/21   [provider]  febuxostat (ULORIC) 40 MG tablet Take 40 mg  by mouth daily. 07/17/21   [provider]  fish oil-omega-3 fatty acids 1000 MG capsule Take 4 g by mouth daily.    [provider]  hydrochlorothiazide (MICROZIDE) 12.5 MG capsule Take 12.5 mg by mouth every morning. 06/19/21   [provider]  lisinopril (ZESTRIL) 20 MG tablet Take 20 mg by mouth 2 (two) times daily. 06/19/21   [provider]  pantoprazole (PROTONIX) 40 MG tablet Take 1 tablet by mouth every evening. 07/17/21   [provider]                                                                                                                                    Past Surgical History Past Surgical History:  Procedure Laterality Date   ABDOMINAL HYSTERECTOMY     Family History Family History  Problem Relation Age of Onset   Hypertension Mother    Heart disease Mother    Stroke Father    Heart disease Father  Cancer Brother    COPD Brother    Prostate cancer Brother     Social History Social History   Tobacco Use   Smoking status: Former    Types: Cigarettes    Quit date: 05/24/1992    Years since quitting: 29.6  Substance Use Topics   Alcohol use: Yes    Comment: Occasional   Drug use: No   Allergies Allopurinol, Amoxicillin, Crestor [rosuvastatin], Macrolides and ketolides, Mycinette [phenol], Simvastatin, and Triamcinolone  Review of Systems Review of Systems  Unable to perform ROS: Mental status change    Physical Exam Vital Signs  I have reviewed the triage vital signs BP (!) 140/100   Pulse 99   Temp 99.9 F (37.7 C) (Rectal)   Resp 13   SpO2 98%  Physical Exam Vitals and nursing note reviewed.  Constitutional:      General: She is not in acute distress.    Appearance: She is well-developed.  HENT:     Head: Normocephalic and atraumatic.     Mouth/Throat:     Mouth: Mucous membranes are dry.  Eyes:     Pupils: Pupils are equal, round, and reactive to light.  Cardiovascular:     Rate and Rhythm:  Normal rate and regular rhythm.     Heart sounds: No murmur heard. Pulmonary:     Effort: Pulmonary effort is normal. No respiratory distress.     Breath sounds: Normal breath sounds.  Abdominal:     General: Abdomen is flat.     Palpations: Abdomen is soft.     Tenderness: There is no abdominal tenderness.  Musculoskeletal:        General: No tenderness.     Right lower leg: No edema.     Left lower leg: No edema.  Skin:    General: Skin is warm and dry.  Neurological:     Mental Status: She is alert.     Comments: Patient intermittently participatory in exam, attentive to interviewer but will only reply that her name is Maria Dunlap.  No obvious facial droop, patient moving all extremities equally, otherwise babbling speech without clear words  Psychiatric:        Mood and Affect: Mood normal.        Behavior: Behavior normal.     ED Results and Treatments Labs (all labs ordered are listed, but only abnormal results are displayed) Labs Reviewed  CBC WITH DIFFERENTIAL/PLATELET - Abnormal; Notable for the following components:      Result Value   WBC 10.8 (*)    RBC 2.68 (*)    Hemoglobin 8.8 (*)    HCT 27.1 (*)    MCV 101.1 (*)    RDW 19.9 (*)    Neutro Abs 8.4 (*)    Abs Immature Granulocytes 0.10 (*)    All other components within normal limits  COMPREHENSIVE METABOLIC PANEL - Abnormal; Notable for the following components:   Sodium 151 (*)    Potassium 2.5 (*)    Chloride 124 (*)    CO2 19 (*)    Glucose, Bld 100 (*)    Total Protein 6.2 (*)    Albumin 3.0 (*)    Total Bilirubin 1.7 (*)    All other components within normal limits  URINE CULTURE  PROTIME-INR  LIPASE, BLOOD  ETHANOL  URINALYSIS, ROUTINE W REFLEX MICROSCOPIC  MAGNESIUM  VITAMIN B1  VITAMIN B12  AMMONIA  FOLATE  Radiology CT Head Wo Contrast  Result Date: 01/22/2022 CLINICAL  DATA:  Mental status change, unknown cause. EXAM: CT HEAD WITHOUT CONTRAST TECHNIQUE: Contiguous axial images were obtained from the base of the skull through the vertex without intravenous contrast. RADIATION DOSE REDUCTION: This exam was performed according to the departmental dose-optimization program which includes automated exposure control, adjustment of the mA and/or kV according to patient size and/or use of iterative reconstruction technique. COMPARISON:  Head CT 01/09/2022 FINDINGS: Brain: There is no evidence of an acute infarct, intracranial hemorrhage, mass, midline shift, or extra-axial fluid collection. There is mild cerebral atrophy. Periventricular white matter hypodensities are unchanged and nonspecific but compatible with mild chronic small vessel ischemic disease. Vascular: No hyperdense vessel. Skull: No fracture or suspicious osseous lesion. Sinuses/Orbits: No significant inflammatory disease in the included paranasal sinuses or mastoid air cells. Unremarkable orbits. Other: None. IMPRESSION: 1. No evidence of acute intracranial abnormality. 2. Mild chronic small vessel ischemic disease. Electronically Signed   By: Sebastian Ache M.D.   On: 01/22/2022 16:42   DG Chest 1 View  Result Date: 01/22/2022 CLINICAL DATA:  Chest pain with altered mental status. EXAM: CHEST  1 VIEW COMPARISON:  Radiographs 01/09/2022.  CT 01/09/2022. FINDINGS: 1617 hours. Lordotic positioning. The heart size and mediastinal contours are normal. The lungs are clear. There is no pleural effusion or pneumothorax. No acute osseous findings are identified. There are degenerative changes at both shoulders. Telemetry leads overlie the chest. IMPRESSION: No evidence of active cardiopulmonary process. Electronically Signed   By: Carey Bullocks M.D.   On: 01/22/2022 16:24    Pertinent labs & imaging results that were available during my care of the patient were reviewed by me and considered in my medical decision making (see  MDM for details).  Medications Ordered in ED Medications  potassium chloride 10 mEq in 100 mL IVPB (has no administration in time range)  magnesium sulfate IVPB 2 g 50 mL (has no administration in time range)  lactated ringers bolus 1,000 mL (has no administration in time range)                                                                                                                                     Procedures Procedures  (including critical care time)  Medical Decision Making / ED Course   MDM:  71 year old female presenting to the emergency department with altered mental status.  Patient overall well-appearing but appears slightly dehydrated on exam.  Mental status exam abnormal but without obvious focal neurologic deficit, limited by patient participation.  Differential includes intracranial process, obtain CT scan, also includes infectious cause, will obtain urinalysis and chest x-ray.  No rashes, belly tenderness to suggest intra-abdominal infection, skin or soft tissue infection.  No meningismus to suggest meningitis and mental status change reportedly similar to previous hospitalization.  Also check electrolytes this patient was recently admitted for hyponatremia and with persistent  altered mental status, no tremor on exam.  Clinical Course as of 01/22/22 1740  Fri Jan 22, 2022  1728 Urinalysis, Routine w reflex microscopic Urine, In & Out Cath [WS]  1738 Labs notable for hypernatremia, hypokalemia. Will replete and give IV fluids, patient appears dehydrated. Urinalysis pending. Discussed with hospitalist who will admit, and they will follow up UA.  [WS]    Clinical Course User Index [WS] Suezanne JacquetScheving, Jerilee FieldWilliam L, MD     Additional history obtained: -Additional history obtained from family and ems -External records from outside source obtained and reviewed including: Chart review including previous notes, labs, imaging, consultation notes   Lab Tests: -I ordered,  reviewed, and interpreted labs.   The pertinent results include:   Labs Reviewed  CBC WITH DIFFERENTIAL/PLATELET - Abnormal; Notable for the following components:      Result Value   WBC 10.8 (*)    RBC 2.68 (*)    Hemoglobin 8.8 (*)    HCT 27.1 (*)    MCV 101.1 (*)    RDW 19.9 (*)    Neutro Abs 8.4 (*)    Abs Immature Granulocytes 0.10 (*)    All other components within normal limits  COMPREHENSIVE METABOLIC PANEL - Abnormal; Notable for the following components:   Sodium 151 (*)    Potassium 2.5 (*)    Chloride 124 (*)    CO2 19 (*)    Glucose, Bld 100 (*)    Total Protein 6.2 (*)    Albumin 3.0 (*)    Total Bilirubin 1.7 (*)    All other components within normal limits  URINE CULTURE  PROTIME-INR  LIPASE, BLOOD  ETHANOL  URINALYSIS, ROUTINE W REFLEX MICROSCOPIC  MAGNESIUM  VITAMIN B1  VITAMIN B12  AMMONIA  FOLATE      EKG   EKG Interpretation  Date/Time:  Friday January 22 2022 16:24:13 EDT Ventricular Rate:  94 PR Interval:  138 QRS Duration: 85 QT Interval:  424 QTC Calculation: 531 R Axis:   -40 Text Interpretation: Sinus rhythm Left anterior fascicular block Abnormal R-wave progression, late transition LVH with secondary repolarization abnormality Prolonged QT interval Confirmed by Alvino BloodScheving, Furkan Keenum ([id number]) on 01/22/2022 4:26:42 PM         Imaging Studies ordered: I ordered imaging studies including CT head, CXR  On my interpretation imaging demonstrates no acute process I independently visualized and interpreted imaging. I agree with the radiologist interpretation   Medicines ordered and prescription drug management: Meds ordered this encounter  Medications   potassium chloride 10 mEq in 100 mL IVPB   magnesium sulfate IVPB 2 g 50 mL   lactated ringers bolus 1,000 mL    -I have reviewed the patients home medicines and have made adjustments as needed   Consultations Obtained: I requested consultation with the hospitalist,  and discussed  lab and imaging findings as well as pertinent plan - they recommend: admission   Cardiac Monitoring: The patient was maintained on a cardiac monitor.  I personally viewed and interpreted the cardiac monitored which showed an underlying rhythm of: NSR  Social Determinants of Health:  Factors impacting patients care include: alcohol use   Reevaluation: After the interventions noted above, I reevaluated the patient and found that they have improved  Co morbidities that complicate the patient evaluation  Past Medical History:  Diagnosis Date   Eczema    Essential hypertension    Gout    Hyperlipidemia    Menopause    Prediabetes  Statin intolerance       Dispostion: Admit    Final Clinical Impression(s) / ED Diagnoses Final diagnoses:  Hypernatremia  Dehydration  Hypokalemia  Confusion     This chart was dictated using voice recognition software.  Despite best efforts to proofread,  errors can occur which can change the documentation meaning.    Lonell Grandchild, MD 01/22/22 1740

## 2022-01-22 NOTE — ED Notes (Signed)
In and out cath complete, pt had scant cloudy urine, female tech at bedside to observe sterile technique

## 2022-01-22 NOTE — H&P (Addendum)
TRH H&P   Patient Demographics:    Maria Dunlap, is a 71 y.o. female  MRN: [id number]   DOB - May 30, 1950  Admit Date - 01/22/2022  Outpatient Primary MD for the patient is Patient, No Pcp Per  Referring MD/NP/PA: Dr Suezanne Jacquet  Patient coming from: [facility]  Chief Complaint  Patient presents with   Altered Mental Status      HPI:    Maria Dunlap  is a 71 y.o. female, past medical history of hypertension, hyperlipidemia, alcohol abuse, patient with recent hospitalization at [facility] where she remained there for 9 days, hospitalization was related to alcohol withdrawal, hyponatremia of 112, Aerococcus UTI where she was treated with 5 days of IV vancomycin, patient remained encephalopathic/confused upon discharge from [facility], remain the same at the facility, he was brought to [facility], ED today given continued confusion, poor oral intake and drinking, patient is minimally interactive unable to provide any complaints, as discussed with the husband/daughter at bedside, patient with history of heavy alcohol abuse, but she was more interactive and appropriate before presented to [facility], she was driving until 5 months ago. - in ED she appears altered, not able to answer any questions appropriately, family report this is no significant change from her new mental baseline since she was discharged from [facility], work-up significant for sodium of 151, potassium of 2.5, hemoglobin of 8.8, white blood cell count of 10.8, CT head with no acute findings, UA remains pending, [facility] hospitalist consulted to admit.    Review of systems:    Patient is confused, cannot provide early reliable review of system  With Past History of the following :    Past Medical History:  Diagnosis Date   Eczema    Essential hypertension    Gout    Hyperlipidemia     Menopause    Prediabetes    Statin intolerance       Past Surgical History:  Procedure Laterality Date   ABDOMINAL HYSTERECTOMY        Social History:     Social History   Tobacco Use   Smoking status: Former    Types: Cigarettes    Quit date: 05/24/1992    Years since quitting: 29.6   Smokeless tobacco: Not on file  Substance Use Topics   Alcohol use: Yes    Comment: Occasional     Lives -she is currently admitted to [facility]  Mobility -family patient has not been mobile since recent hospitalization    Family History :     Family History  Problem Relation Age of Onset   Hypertension Mother    Heart disease Mother    Stroke Father    Heart disease Father    Cancer Brother    COPD Brother    Prostate cancer Brother       Home Medications:   Prior  to Admission medications   Medication Sig Start Date End Date Taking? Authorizing Provider  amLODipine (NORVASC) 5 MG tablet Take 5 mg by mouth daily. 06/19/21   [provider]  cloNIDine (CATAPRES) 0.1 MG tablet Take 1 tablet by mouth 2 (two) times daily. 03/01/14   [provider]  ezetimibe (ZETIA) 10 MG tablet Take 10 mg by mouth daily. 06/19/21   [provider]  febuxostat (ULORIC) 40 MG tablet Take 40 mg by mouth daily. 07/17/21   [provider]  fish oil-omega-3 fatty acids 1000 MG capsule Take 4 g by mouth daily.    [provider]  hydrochlorothiazide (MICROZIDE) 12.5 MG capsule Take 12.5 mg by mouth every morning. 06/19/21   [provider]  lisinopril (ZESTRIL) 20 MG tablet Take 20 mg by mouth 2 (two) times daily. 06/19/21   [provider]  pantoprazole (PROTONIX) 40 MG tablet Take 1 tablet by mouth every evening. 07/17/21   [provider]     Allergies:     Allergies  Allergen Reactions   Allopurinol     Elevated LFT's   Amoxicillin     Neck swell   Crestor [Rosuvastatin]     dizziness   Macrolides And Ketolides    Mycinette  [Phenol]    Simvastatin     Elevated LFT's    Triamcinolone Rash     Physical Exam:   Vitals  Blood pressure (!) 140/100, pulse 99, temperature 99.9 F (37.7 C), temperature source Rectal, resp. rate 13, SpO2 98 %.   1. General elderly deconditioned female, laying in bed in no apparent distress  2..  Cognition and insight, does not answer any questions or follow any commands   3. No F.N deficits, ALL C.Nerves Intact, Strength 5/5 all 4 extremities, Sensation intact all 4 extremities, Plantars down going.  4. Ears and Eyes appear Normal, Conjunctivae clear, PERRLA. Dry  Oral Mucosa.  5. Supple Neck, No JVD, No cervical lymphadenopathy appriciated, No Carotid Bruits.  6. Symmetrical Chest wall movement, Good air movement bilaterally, CTAB.  7. RRR, No Gallops, Rubs or Murmurs, No Parasternal Heave.  8. Positive Bowel Sounds, Abdomen Soft, No tenderness, No organomegaly appriciated,No rebound -guarding or rigidity.  9.  No Cyanosis, slow Skin Turgor, No Skin Rash or Bruise.  10. Good muscle tone,  joints appear normal , no effusions, Normal ROM.     Data Review:    CBC Recent Labs  Lab 01/22/22 1549  WBC 10.8*  HGB 8.8*  HCT 27.1*  PLT 174  MCV 101.1*  MCH 32.8  MCHC 32.5  RDW 19.9*  LYMPHSABS 1.5  MONOABS 0.7  EOSABS 0.0  BASOSABS 0.1   ------------------------------------------------------------------------------------------------------------------  Chemistries  Recent Labs  Lab 01/22/22 1549  NA 151*  K 2.5*  CL 124*  CO2 19*  GLUCOSE 100*  BUN 21  CREATININE 0.95  CALCIUM 8.9  AST 38  ALT 22  ALKPHOS 63  BILITOT 1.7*   ------------------------------------------------------------------------------------------------------------------ CrCl cannot be calculated (Unknown ideal weight.). ------------------------------------------------------------------------------------------------------------------ No results for input(s): "TSH", "T4TOTAL",  "T3FREE", "THYROIDAB" in the last 72 hours.  Invalid input(s): "FREET3"  Coagulation profile Recent Labs  Lab 01/22/22 1549  INR 1.2   ------------------------------------------------------------------------------------------------------------------- No results for input(s): "DDIMER" in the last 72 hours. -------------------------------------------------------------------------------------------------------------------  Cardiac Enzymes No results for input(s): "CKMB", "TROPONINI", "MYOGLOBIN" in the last 168 hours.  Invalid input(s): "CK" ------------------------------------------------------------------------------------------------------------------ No results found for: "BNP"   ---------------------------------------------------------------------------------------------------------------  Urinalysis    Component Value Date/Time  BILIRUBINUR neg 08/13/2013 1218   PROTEINUR neg 08/13/2013 1218   UROBILINOGEN negative 08/13/2013 1218   NITRITE neg 08/13/2013 1218   LEUKOCYTESUR Trace 08/13/2013 1218    ----------------------------------------------------------------------------------------------------------------   Imaging Results:    CT Head Wo Contrast  Result Date: 01/22/2022 CLINICAL DATA:  Mental status change, unknown cause. EXAM: CT HEAD WITHOUT CONTRAST TECHNIQUE: Contiguous axial images were obtained from the base of the skull through the vertex without intravenous contrast. RADIATION DOSE REDUCTION: This exam was performed according to the departmental dose-optimization program which includes automated exposure control, adjustment of the mA and/or kV according to patient size and/or use of iterative reconstruction technique. COMPARISON:  Head CT 01/09/2022 FINDINGS: Brain: There is no evidence of an acute infarct, intracranial hemorrhage, mass, midline shift, or extra-axial fluid collection. There is mild cerebral atrophy. Periventricular white matter hypodensities  are unchanged and nonspecific but compatible with mild chronic small vessel ischemic disease. Vascular: No hyperdense vessel. Skull: No fracture or suspicious osseous lesion. Sinuses/Orbits: No significant inflammatory disease in the included paranasal sinuses or mastoid air cells. Unremarkable orbits. Other: None. IMPRESSION: 1. No evidence of acute intracranial abnormality. 2. Mild chronic small vessel ischemic disease. Electronically Signed   By: Sebastian Ache M.D.   On: 01/22/2022 16:42   DG Chest 1 View  Result Date: 01/22/2022 CLINICAL DATA:  Chest pain with altered mental status. EXAM: CHEST  1 VIEW COMPARISON:  Radiographs 01/09/2022.  CT 01/09/2022. FINDINGS: 1617 hours. Lordotic positioning. The heart size and mediastinal contours are normal. The lungs are clear. There is no pleural effusion or pneumothorax. No acute osseous findings are identified. There are degenerative changes at both shoulders. Telemetry leads overlie the chest. IMPRESSION: No evidence of active cardiopulmonary process. Electronically Signed   By: Carey Bullocks M.D.   On: 01/22/2022 16:24    My personal review of EKG: Rhythm NSR, Rate  94 /min, QTc 531 , Left anterior fascicular block Abnormal R-wave progression, late transition   Assessment & Plan:    Principal Problem:   Hypernatremia Active Problems:   HTN (hypertension)   HLD (hyperlipidemia)   B12 deficiency   AMS (altered mental status)  Hyponatremia -Secondary to volume depletion and dehydration, for now we will continue with NS, till volume deficit has been corrected, then if she still remains hypernatremic will transition to half NS. -Recheck BMP in 4 hours and adjust fluids as needed, continue to monitor sodium closely   Subacute encephalopathy -Patient with rapid decline in his mental status over the last 2 weeks, I will check B1, B12, folic acid, TSH within normal limit during recent hospital stay -will obtain MRI of the brain for further  evaluation. -will start on high-dose be 1 supplement after level is obtained - will request neurology consult. -Check UA to rule out UTI this admission  Hypokalemia -Due to volume depletion and poor oral intake -Repleted aggressively especially in the setting of long QTc   Hypertension -blood pressures acceptable, think official medication reconciliation, but it does appear all her antihypertensive medication has been stopped upon discharge from [facility] as blood pressure was controlled without any occasions during hospital stay. Prolonged  QTc -This is secondary to electrolyte abnormalities, avoid prolonging medications, correct hypokalemia and monitoring telemetry  B12 deficiency -Continue with supplements  GERD Continue with PPI-   Hyperlipidemia  - Continue with Zetia and fish oil     DVT Prophylaxis Heparin  AM Labs Ordered, also please review Full Orders  Family Communication: Admission, patients condition  and plan of care including tests being ordered have been discussed with the Husband and daughter  who indicate understanding and agree with the plan and Code Status.  Code Status Full  Likely DC to  back to SNF  Condition GUARDED    Consults called: tele neuro requested    Admission status: inpatient    Time spent in minutes : 70 minutes   Huey Bienenstock M.D on 01/22/2022 at 5:55 PM   [facility] - Office  [phone]

## 2022-01-23 DIAGNOSIS — E785 Hyperlipidemia, unspecified: Secondary | ICD-10-CM | POA: Diagnosis not present

## 2022-01-23 DIAGNOSIS — E538 Deficiency of other specified B group vitamins: Secondary | ICD-10-CM | POA: Diagnosis not present

## 2022-01-23 DIAGNOSIS — R4182 Altered mental status, unspecified: Secondary | ICD-10-CM | POA: Diagnosis not present

## 2022-01-23 DIAGNOSIS — E870 Hyperosmolality and hypernatremia: Secondary | ICD-10-CM | POA: Diagnosis not present

## 2022-01-23 DIAGNOSIS — I10 Essential (primary) hypertension: Secondary | ICD-10-CM

## 2022-01-23 LAB — BASIC METABOLIC PANEL
Anion gap: 7 (ref 5–15)
BUN: 20 mg/dL (ref 8–23)
CO2: 21 mmol/L — ABNORMAL LOW (ref 22–32)
Calcium: 8.5 mg/dL — ABNORMAL LOW (ref 8.9–10.3)
Chloride: 124 mmol/L — ABNORMAL HIGH (ref 98–111)
Creatinine, Ser: 0.85 mg/dL (ref 0.44–1.00)
GFR, Estimated: >60 mL/min (ref >60)
Glucose, Bld: 91 mg/dL (ref 70–99)
Potassium: 4.1 mmol/L (ref 3.5–5.1)
Sodium: 152 mmol/L — ABNORMAL HIGH (ref 135–145)

## 2022-01-23 LAB — TSH: TSH: 2.159 u[IU]/mL (ref 0.350–4.500)

## 2022-01-23 LAB — CBC
HCT: 23.4 % — ABNORMAL LOW (ref 36.0–46.0)
Hemoglobin: 7.4 g/dL — ABNORMAL LOW (ref 12.0–15.0)
MCH: 32.2 pg (ref 26.0–34.0)
MCHC: 31.6 g/dL (ref 30.0–36.0)
MCV: 101.7 fL — ABNORMAL HIGH (ref 80.0–100.0)
Platelets: 148 10*3/uL — ABNORMAL LOW (ref 150–400)
RBC: 2.3 MIL/uL — ABNORMAL LOW (ref 3.87–5.11)
RDW: 19.9 % — ABNORMAL HIGH (ref 11.5–15.5)
WBC: 9.8 10*3/uL (ref 4.0–10.5)
nRBC: 0 % (ref 0.0–0.2)

## 2022-01-23 LAB — IRON AND TIBC
Iron: 33 ug/dL (ref 28–170)
Saturation Ratios: 24 % (ref 10.4–31.8)
TIBC: 135 ug/dL — ABNORMAL LOW (ref 250–450)
UIBC: 102 ug/dL

## 2022-01-23 MED ORDER — POTASSIUM CHLORIDE 10 MEQ/100ML IV SOLN
INTRAVENOUS | Status: AC
  Filled 2022-01-23: qty 100

## 2022-01-23 MED ORDER — DEXTROSE 5 % IV SOLN: INTRAVENOUS | Status: DC

## 2022-01-23 MED ORDER — ENSURE ENLIVE PO LIQD
237.0000 mL | Freq: Two times a day (BID) | ORAL | Status: DC
  Administered 2022-01-24 – 2022-01-29 (×6): 237 mL via ORAL

## 2022-01-23 NOTE — Hospital Course (Addendum)
71 y.o. female, past medical history of hypertension, hyperlipidemia, alcohol abuse, patient with recent hospitalization at [facility] where she remained there for 9 days, hospitalization was related to alcohol withdrawal, hyponatremia of 112, Aerococcus UTI where she was treated with 5 days of IV vancomycin, patient remained encephalopathic/confused upon discharge from [facility], remain the same at the facility, he was brought to [facility], ED today given continued confusion, poor oral intake and drinking, patient is minimally interactive unable to provide any complaints, as discussed with the husband/daughter at bedside, patient with history of heavy alcohol abuse, but she was more interactive and appropriate before presented to [facility], she was driving until 5 months ago. - in ED she appears altered, not able to answer any questions appropriately, family report this is no significant change from her new mental baseline since she was discharged from [facility], work-up significant for sodium of 151, potassium of 2.5, hemoglobin of 8.8, white blood cell count of 10.8, CT head with no acute findings, UA remains pending, [facility] hospitalist consulted to admit. Work up was initiated for reversible causes of altered mental status including EEG, MRI brain, UA/culture, B12, TSH all of which were unremarkable.  She was started on high dose thiamine with small yet incremental improvement of her mental status.  After GOC discussions with the family, the plan was to d/c her to SNF with palliative services.

## 2022-01-23 NOTE — Progress Notes (Addendum)
PROGRESS NOTE   Maria Dunlap  LPF:[id number] DOB: 11-28-1950 DOA: 01/22/2022 PCP: Patient, No Pcp Per   Chief Complaint  Patient presents with   Altered Mental Status   Level of care: Telemetry  Brief Admission History:  71 y.o. female, past medical history of hypertension, hyperlipidemia, alcohol abuse, patient with recent hospitalization at [facility] where she remained there for 9 days, hospitalization was related to alcohol withdrawal, hyponatremia of 112, Aerococcus UTI where she was treated with 5 days of IV vancomycin, patient remained encephalopathic/confused upon discharge from [facility], remain the same at the facility, he was brought to [facility], ED today given continued confusion, poor oral intake and drinking, patient is minimally interactive unable to provide any complaints, as discussed with the husband/daughter at bedside, patient with history of heavy alcohol abuse, but she was more interactive and appropriate before presented to [facility], she was driving until 5 months ago. - in ED she appears altered, not able to answer any questions appropriately, family report this is no significant change from her new mental baseline since she was discharged from [facility], work-up significant for sodium of 151, potassium of 2.5, hemoglobin of 8.8, white blood cell count of 10.8, CT head with no acute findings, UA remains pending, [facility] hospitalist consulted to admit.   Assessment and Plan: Hypernatremia - sodium increased from admission - add free water, DC NS - D5W infusion ordered - encourage oral free water  - recheck BMP in AM   Dehydration/poor oral intake - husband reports that symptoms have persisted for several weeks - with failure to thrive will request a palliative medicine consultation - IV fluid for now, encourage eating, assist with feeding order placed  Metabolic encephalopathy - dementia work up in progress, normal B12 and folic  acid level - MRI and CT brain, no acute findings - correcting sodium level noted above - Adult EEG requested - continue high dose IV thiamine given history of alcohol abuse - follow up TSH  Hypokalemia - repleted, follow K and Mg  Essential hypertension - no longer required medication recently  - continue to follow  Prolonged QTc - likely exacerbated by electrolyte derangements  History of B12 deficiency - recent B12 level confirms this has been repleted  GERD - continue PPI for GI protection   Hyperlipidemia - resumed home zetia and fish oil   DVT prophylaxis: heparin Code Status:  Full  Family Communication: husband at bedside updated Disposition: Status is: Inpatient Remains inpatient appropriate because: IV fluids required    Consultants:   Procedures:   Antimicrobials:    Subjective: Pt remains nonverbal this morning.  Husband says she was speaking during the night quite a bit.  She refuses to speak this morning.  She did take morning meds with no difficulty.  Husband says she has had difficulty swallowing in the past.   Objective: Vitals:   01/22/22 2000 01/22/22 2253 01/23/22 0424 01/23/22 0926  BP: (!) 133/94 (!) 132/91 122/85   Pulse: 90 93 98   Resp: 16 18 18    Temp:  97.6 F (36.4 C) 98.3 F (36.8 C)   TempSrc:   Oral   SpO2: 98% 100% 98%   Height:    4\' 9"  (1.448 m)    Intake/Output Summary (Last 24 hours) at 01/23/2022 1004 Last data filed at 01/23/2022 0000 Gross per 24 hour  Intake 614.97 ml  Output --  Net 614.97 ml   There were no vitals filed for this  visit. Examination:  General exam: Pt is mute, refuses to speak, but eyes tracking, Appears calm and comfortable  Respiratory system: Clear to auscultation. Respiratory effort normal. Cardiovascular system: normal S1 & S2 heard. No JVD, murmurs, rubs, gallops or clicks. No pedal edema. Gastrointestinal system: Abdomen is nondistended, soft and nontender. No organomegaly or masses felt.  Normal bowel sounds heard. Central nervous system: Alert and awake. No focal neurological deficits. Extremities: Symmetric 5 x 5 power. Skin: No rashes, lesions or ulcers. Psychiatry: Judgement and insight UTD. Mood & affect UTD.   Data Reviewed: I have personally reviewed following labs and imaging studies  CBC: Recent Labs  Lab 01/22/22 1549 01/23/22 0259  WBC 10.8* 9.8  NEUTROABS 8.4*  --   HGB 8.8* 7.4*  HCT 27.1* 23.4*  MCV 101.1* 101.7*  PLT 174 148*    Basic Metabolic Panel: Recent Labs  Lab 01/22/22 1549 01/22/22 1718 01/22/22 1950 01/23/22 0259  NA 151*  --  149* 152*  K 2.5*  --  2.8* 4.1  CL 124*  --  124* 124*  CO2 19*  --  18* 21*  GLUCOSE 100*  --  98 91  BUN 21  --  20 20  CREATININE 0.95  --  0.99 0.85  CALCIUM 8.9  --  8.4* 8.5*  MG  --  2.1  --   --     CBG: No results for input(s): "GLUCAP" in the last 168 hours.  No results found for this or any previous visit (from the past 240 hour(s)).   Radiology Studies: MR BRAIN WO CONTRAST  Result Date: 01/22/2022 CLINICAL DATA:  Initial evaluation for altered mental status, delirium. EXAM: MRI HEAD WITHOUT CONTRAST TECHNIQUE: Multiplanar, multiecho pulse sequences of the brain and surrounding structures were obtained without intravenous contrast. COMPARISON:  Prior CT from earlier the same day. FINDINGS: Brain: Diffuse prominence of the CSF containing spaces compatible generalized cerebral atrophy. No significant cerebral white matter disease for age. No evidence for acute or subacute infarct. Gray-white matter differentiation maintained. No areas of chronic cortical infarction. No acute intracranial hemorrhage. Single punctate chronic microhemorrhage noted within the right cerebellum, of doubtful significance in isolation. No mass lesion, midline shift or mass effect. No hydrocephalus or extra-axial fluid collection. Pituitary gland and suprasellar region within normal limits. Vascular: Major intracranial  vascular flow voids are maintained. Skull and upper cervical spine: Craniocervical junction within normal limits. Bone marrow signal intensity normal. No scalp soft tissue abnormality. Sinuses/Orbits: Globes orbital soft tissues demonstrate no acute finding. Paranasal sinuses are largely clear. Trace left mastoid effusion noted, of doubtful significance. Other: None. IMPRESSION: 1. No acute intracranial abnormality. 2. Mildly advanced cerebral atrophy. Otherwise unremarkable brain MRI for age. Electronically Signed   By: Rise Mu M.D.   On: 01/22/2022 19:05   CT Head Wo Contrast  Result Date: 01/22/2022 CLINICAL DATA:  Mental status change, unknown cause. EXAM: CT HEAD WITHOUT CONTRAST TECHNIQUE: Contiguous axial images were obtained from the base of the skull through the vertex without intravenous contrast. RADIATION DOSE REDUCTION: This exam was performed according to the departmental dose-optimization program which includes automated exposure control, adjustment of the mA and/or kV according to patient size and/or use of iterative reconstruction technique. COMPARISON:  Head CT 01/09/2022 FINDINGS: Brain: There is no evidence of an acute infarct, intracranial hemorrhage, mass, midline shift, or extra-axial fluid collection. There is mild cerebral atrophy. Periventricular white matter hypodensities are unchanged and nonspecific but compatible with mild chronic small vessel ischemic disease.  Vascular: No hyperdense vessel. Skull: No fracture or suspicious osseous lesion. Sinuses/Orbits: No significant inflammatory disease in the included paranasal sinuses or mastoid air cells. Unremarkable orbits. Other: None. IMPRESSION: 1. No evidence of acute intracranial abnormality. 2. Mild chronic small vessel ischemic disease. Electronically Signed   By: Sebastian Ache M.D.   On: 01/22/2022 16:42   DG Chest 1 View  Result Date: 01/22/2022 CLINICAL DATA:  Chest pain with altered mental status. EXAM: CHEST  1  VIEW COMPARISON:  Radiographs 01/09/2022.  CT 01/09/2022. FINDINGS: 1617 hours. Lordotic positioning. The heart size and mediastinal contours are normal. The lungs are clear. There is no pleural effusion or pneumothorax. No acute osseous findings are identified. There are degenerative changes at both shoulders. Telemetry leads overlie the chest. IMPRESSION: No evidence of active cardiopulmonary process. Electronically Signed   By: Carey Bullocks M.D.   On: 01/22/2022 16:24    Scheduled Meds:  enoxaparin (LOVENOX) injection  40 mg Subcutaneous Q24H   cyanocobalamin  500 mcg Oral Daily   Continuous Infusions:  dextrose 65 mL/hr at 01/23/22 0839   thiamine (VITAMIN B1) injection 500 mg (01/22/22 2343)    LOS: 1 day   Time spent: 35 mins  Kailani Brass Laural Benes, MD How to contact the [facility] Attending or Consulting provider 7A - 7P or covering provider during after hours 7P -7A, for this patient?  Check the care team in [facility] and look for a) attending/consulting [facility] provider listed and b) the [facility] team listed Log into [email or web address] and use [place]'s universal password to access. If you do not have the password, please contact the hospital operator. Locate the [facility] provider you are looking for under [facility] and page to a number that you can be directly reached. If you still have difficulty reaching the provider, please page the [facility] (Director on Call) for the Hospitalists listed on amion for assistance.  01/23/2022, 10:04 AM

## 2022-01-24 ENCOUNTER — Encounter (HOSPITAL_COMMUNITY): Payer: Self-pay | Admitting: Internal Medicine

## 2022-01-24 DIAGNOSIS — E785 Hyperlipidemia, unspecified: Secondary | ICD-10-CM | POA: Diagnosis not present

## 2022-01-24 DIAGNOSIS — E870 Hyperosmolality and hypernatremia: Secondary | ICD-10-CM | POA: Diagnosis not present

## 2022-01-24 DIAGNOSIS — E538 Deficiency of other specified B group vitamins: Secondary | ICD-10-CM | POA: Diagnosis not present

## 2022-01-24 DIAGNOSIS — R4182 Altered mental status, unspecified: Secondary | ICD-10-CM | POA: Diagnosis not present

## 2022-01-24 LAB — BASIC METABOLIC PANEL
Anion gap: 5 (ref 5–15)
BUN: 16 mg/dL (ref 8–23)
CO2: 18 mmol/L — ABNORMAL LOW (ref 22–32)
Calcium: 8.2 mg/dL — ABNORMAL LOW (ref 8.9–10.3)
Chloride: 120 mmol/L — ABNORMAL HIGH (ref 98–111)
Creatinine, Ser: 0.82 mg/dL (ref 0.44–1.00)
GFR, Estimated: >60 mL/min (ref >60)
Glucose, Bld: 124 mg/dL — ABNORMAL HIGH (ref 70–99)
Potassium: 3.7 mmol/L (ref 3.5–5.1)
Sodium: 143 mmol/L (ref 135–145)

## 2022-01-24 LAB — CBC
HCT: 23.3 % — ABNORMAL LOW (ref 36.0–46.0)
Hemoglobin: 7.3 g/dL — ABNORMAL LOW (ref 12.0–15.0)
MCH: 32.3 pg (ref 26.0–34.0)
MCHC: 31.3 g/dL (ref 30.0–36.0)
MCV: 103.1 fL — ABNORMAL HIGH (ref 80.0–100.0)
Platelets: 164 10*3/uL (ref 150–400)
RBC: 2.26 MIL/uL — ABNORMAL LOW (ref 3.87–5.11)
RDW: 19.9 % — ABNORMAL HIGH (ref 11.5–15.5)
WBC: 9.6 10*3/uL (ref 4.0–10.5)
nRBC: 0 % (ref 0.0–0.2)

## 2022-01-24 LAB — URINE CULTURE: Culture: >=100000 — AB

## 2022-01-24 LAB — MAGNESIUM: Magnesium: 2 mg/dL (ref 1.7–2.4)

## 2022-01-24 MED ORDER — PANTOPRAZOLE SODIUM 40 MG PO TBEC
40.0000 mg | DELAYED_RELEASE_TABLET | Freq: Every evening | ORAL | Status: DC
  Administered 2022-01-24: 40 mg via ORAL
  Filled 2022-01-24 (×2): qty 1

## 2022-01-24 MED ORDER — EZETIMIBE 10 MG PO TABS
10.0000 mg | ORAL_TABLET | Freq: Every day | ORAL | Status: DC
  Administered 2022-01-24 – 2022-01-29 (×6): 10 mg via ORAL
  Filled 2022-01-24 (×6): qty 1

## 2022-01-24 MED ORDER — AMLODIPINE BESYLATE 5 MG PO TABS
5.0000 mg | ORAL_TABLET | Freq: Every day | ORAL | Status: DC
  Administered 2022-01-24 – 2022-01-29 (×6): 5 mg via ORAL
  Filled 2022-01-24 (×6): qty 1

## 2022-01-24 NOTE — Progress Notes (Signed)
PROGRESS NOTE   Maria Dunlap  HCW:[id number] DOB: [birth year] DOA: 01/22/2022 PCP: Patient, No Pcp Per   Chief Complaint  Patient presents with   Altered Mental Status   Level of care: Telemetry  Brief Admission History:  71 y.o. female, past medical history of hypertension, hyperlipidemia, alcohol abuse, patient with recent hospitalization at [facility] where she remained there for 9 days, hospitalization was related to alcohol withdrawal, hyponatremia of 112, Aerococcus UTI where she was treated with 5 days of IV vancomycin, patient remained encephalopathic/confused upon discharge from [facility], remain the same at the facility, he was brought to [facility], ED today given continued confusion, poor oral intake and drinking, patient is minimally interactive unable to provide any complaints, as discussed with the husband/daughter at bedside, patient with history of heavy alcohol abuse, but she was more interactive and appropriate before presented to [facility], she was driving until 5 months ago. - in ED she appears altered, not able to answer any questions appropriately, family report this is no significant change from her new mental baseline since she was discharged from [facility], work-up significant for sodium of 151, potassium of 2.5, hemoglobin of 8.8, white blood cell count of 10.8, CT head with no acute findings, UA remains pending, [facility] hospitalist consulted to admit.   Assessment and Plan: Hypernatremia - add free water with improvement - D5W infusion ordered, reduce rate today - encourage oral free water  - recheck BMP in AM   Dysphagia -Husband reports history of dysphagia -waiting on SLP evaluation -Dys 1 diet requested by RN on 9/2 for safety  Dehydration/poor oral intake - husband reports that symptoms have persisted for several weeks - with failure to thrive will request a palliative medicine consultation - IV fluid for now, encourage  eating, assist with feeding order placed  Metabolic encephalopathy - dementia work up in progress, normal B12 and folic acid level - MRI and CT brain, no acute findings - correcting sodium level noted above - Adult EEG requested - continue high dose IV thiamine given history of alcohol abuse - follow up TSH: 2.159  Hypokalemia - repleted, follow K and Mg  Essential hypertension - resumed home amlodipine  - continue to follow  Prolonged QTc - likely exacerbated by electrolyte derangements  History of B12 deficiency - recent B12 level confirms this has been repleted  GERD - continue PPI for GI protection   Hyperlipidemia - resumed home zetia and fish oil   DVT prophylaxis: heparin Code Status:  Full  Family Communication: husband at bedside updated Disposition: Status is: Inpatient Remains inpatient appropriate because: IV fluids required    Consultants:  Palliative medicine Procedures:   Antimicrobials:    Subjective: Pt speaking this morning.  Her speech is confused and she appears to be mumbling incoherently at times. .   Objective: Vitals:   01/23/22 1516 01/23/22 2006 01/23/22 2007 01/24/22 0513  BP: (!) 138/98 (!) 140/109 (!) 146/102 (!) 145/101  Pulse: (!) 102 99 98 92  Resp:  16  16  Temp: 98.3 F (36.8 C) 98.1 F (36.7 C)  (!) 97.5 F (36.4 C)  TempSrc: Oral Oral    SpO2: 98% 99%  100%  Height:        Intake/Output Summary (Last 24 hours) at 01/24/2022 1139 Last data filed at 01/24/2022 0500 Gross per 24 hour  Intake 510.66 ml  Output --  Net 510.66 ml   There were no vitals filed for this visit. Examination:  General exam: Pt is hard of hearing, speaking incoherently, Appears calm and comfortable  Respiratory system: Clear to auscultation. Respiratory effort normal. Cardiovascular system: normal S1 & S2 heard. No JVD, murmurs, rubs, gallops or clicks. No pedal edema. Gastrointestinal system: Abdomen is nondistended, soft and nontender. No  organomegaly or masses felt. Normal bowel sounds heard. Central nervous system: Alert and awake. No focal neurological deficits. Extremities: Symmetric 5 x 5 power. Skin: No rashes, lesions or ulcers. Psychiatry: Judgement and insight UTD. Mood & affect UTD.   Data Reviewed: I have personally reviewed following labs and imaging studies  CBC: Recent Labs  Lab 01/22/22 1549 01/23/22 0259 01/24/22 0455  WBC 10.8* 9.8 9.6  NEUTROABS 8.4*  --   --   HGB 8.8* 7.4* 7.3*  HCT 27.1* 23.4* 23.3*  MCV 101.1* 101.7* 103.1*  PLT 174 148* 164    Basic Metabolic Panel: Recent Labs  Lab 01/22/22 1549 01/22/22 1718 01/22/22 1950 01/23/22 0259 01/24/22 0455  NA 151*  --  149* 152* 143  K 2.5*  --  2.8* 4.1 3.7  CL 124*  --  124* 124* 120*  CO2 19*  --  18* 21* 18*  GLUCOSE 100*  --  98 91 124*  BUN 21  --  20 20 16   CREATININE 0.95  --  0.99 0.85 0.82  CALCIUM 8.9  --  8.4* 8.5* 8.2*  MG  --  2.1  --   --  2.0    CBG: No results for input(s): "GLUCAP" in the last 168 hours.  No results found for this or any previous visit (from the past 240 hour(s)).   Radiology Studies: MR BRAIN WO CONTRAST  Result Date: 01/22/2022 CLINICAL DATA:  Initial evaluation for altered mental status, delirium. EXAM: MRI HEAD WITHOUT CONTRAST TECHNIQUE: Multiplanar, multiecho pulse sequences of the brain and surrounding structures were obtained without intravenous contrast. COMPARISON:  Prior CT from earlier the same day. FINDINGS: Brain: Diffuse prominence of the CSF containing spaces compatible generalized cerebral atrophy. No significant cerebral white matter disease for age. No evidence for acute or subacute infarct. Gray-white matter differentiation maintained. No areas of chronic cortical infarction. No acute intracranial hemorrhage. Single punctate chronic microhemorrhage noted within the right cerebellum, of doubtful significance in isolation. No mass lesion, midline shift or mass effect. No  hydrocephalus or extra-axial fluid collection. Pituitary gland and suprasellar region within normal limits. Vascular: Major intracranial vascular flow voids are maintained. Skull and upper cervical spine: Craniocervical junction within normal limits. Bone marrow signal intensity normal. No scalp soft tissue abnormality. Sinuses/Orbits: Globes orbital soft tissues demonstrate no acute finding. Paranasal sinuses are largely clear. Trace left mastoid effusion noted, of doubtful significance. Other: None. IMPRESSION: 1. No acute intracranial abnormality. 2. Mildly advanced cerebral atrophy. Otherwise unremarkable brain MRI for age. Electronically Signed   By: 03/24/2022 M.D.   On: 01/22/2022 19:05   CT Head Wo Contrast  Result Date: 01/22/2022 CLINICAL DATA:  Mental status change, unknown cause. EXAM: CT HEAD WITHOUT CONTRAST TECHNIQUE: Contiguous axial images were obtained from the base of the skull through the vertex without intravenous contrast. RADIATION DOSE REDUCTION: This exam was performed according to the departmental dose-optimization program which includes automated exposure control, adjustment of the mA and/or kV according to patient size and/or use of iterative reconstruction technique. COMPARISON:  Head CT 01/09/2022 FINDINGS: Brain: There is no evidence of an acute infarct, intracranial hemorrhage, mass, midline shift, or extra-axial fluid collection. There is mild cerebral atrophy. Periventricular  white matter hypodensities are unchanged and nonspecific but compatible with mild chronic small vessel ischemic disease. Vascular: No hyperdense vessel. Skull: No fracture or suspicious osseous lesion. Sinuses/Orbits: No significant inflammatory disease in the included paranasal sinuses or mastoid air cells. Unremarkable orbits. Other: None. IMPRESSION: 1. No evidence of acute intracranial abnormality. 2. Mild chronic small vessel ischemic disease. Electronically Signed   By: Sebastian Ache M.D.    On: 01/22/2022 16:42   DG Chest 1 View  Result Date: 01/22/2022 CLINICAL DATA:  Chest pain with altered mental status. EXAM: CHEST  1 VIEW COMPARISON:  Radiographs 01/09/2022.  CT 01/09/2022. FINDINGS: 1617 hours. Lordotic positioning. The heart size and mediastinal contours are normal. The lungs are clear. There is no pleural effusion or pneumothorax. No acute osseous findings are identified. There are degenerative changes at both shoulders. Telemetry leads overlie the chest. IMPRESSION: No evidence of active cardiopulmonary process. Electronically Signed   By: Carey Bullocks M.D.   On: 01/22/2022 16:24    Scheduled Meds:  amLODipine  5 mg Oral Daily   enoxaparin (LOVENOX) injection  40 mg Subcutaneous Q24H   ezetimibe  10 mg Oral Daily   feeding supplement  237 mL Oral BID BM   pantoprazole  40 mg Oral QPM   cyanocobalamin  500 mcg Oral Daily   Continuous Infusions:  dextrose 50 mL/hr at 01/24/22 0820   thiamine (VITAMIN B1) injection 500 mg (01/23/22 1722)    LOS: 2 days   Time spent: 35 mins  [place] Urieta Laural Benes, MD How to contact the [facility] Attending or Consulting provider 7A - 7P or covering provider during after hours 7P -7A, for this patient?  Check the care team in [facility] and look for a) attending/consulting TRH provider listed and b) the [facility] team listed Log into [email or web address] and use [facility]'s universal password to access. If you do not have the password, please contact the hospital operator. Locate the [facility] provider you are looking for under [facility] and page to a number that you can be directly reached. If you still have difficulty reaching the provider, please page the [facility] (Director on Call) for the Hospitalists listed on amion for assistance.  01/24/2022, 11:39 AM

## 2022-01-25 ENCOUNTER — Inpatient Hospital Stay (HOSPITAL_COMMUNITY)
Admit: 2022-01-25 | Discharge: 2022-01-25 | Disposition: A | Discharge status: Home / Self Care | Payer: Medicare Other | Attending: Family Medicine | Admitting: Family Medicine

## 2022-01-25 ENCOUNTER — Encounter (HOSPITAL_COMMUNITY): Payer: Self-pay | Admitting: Internal Medicine

## 2022-01-25 DIAGNOSIS — E538 Deficiency of other specified B group vitamins: Secondary | ICD-10-CM | POA: Diagnosis not present

## 2022-01-25 DIAGNOSIS — R4182 Altered mental status, unspecified: Secondary | ICD-10-CM | POA: Diagnosis not present

## 2022-01-25 DIAGNOSIS — E870 Hyperosmolality and hypernatremia: Secondary | ICD-10-CM | POA: Diagnosis not present

## 2022-01-25 DIAGNOSIS — E785 Hyperlipidemia, unspecified: Secondary | ICD-10-CM | POA: Diagnosis not present

## 2022-01-25 LAB — BASIC METABOLIC PANEL
Anion gap: 4 — ABNORMAL LOW (ref 5–15)
BUN: 12 mg/dL (ref 8–23)
CO2: 20 mmol/L — ABNORMAL LOW (ref 22–32)
Calcium: 8.2 mg/dL — ABNORMAL LOW (ref 8.9–10.3)
Chloride: 111 mmol/L (ref 98–111)
Creatinine, Ser: 0.58 mg/dL (ref 0.44–1.00)
GFR, Estimated: >60 mL/min (ref >60)
Glucose, Bld: 114 mg/dL — ABNORMAL HIGH (ref 70–99)
Potassium: 3.3 mmol/L — ABNORMAL LOW (ref 3.5–5.1)
Sodium: 135 mmol/L (ref 135–145)

## 2022-01-25 LAB — MAGNESIUM: Magnesium: 1.7 mg/dL (ref 1.7–2.4)

## 2022-01-25 MED ORDER — POTASSIUM CHLORIDE CRYS ER 20 MEQ PO TBCR: 40.0000 meq | EXTENDED_RELEASE_TABLET | Freq: Once | ORAL | Status: DC

## 2022-01-25 MED ORDER — POTASSIUM CHLORIDE 20 MEQ PO PACK
40.0000 meq | PACK | Freq: Once | ORAL | Status: AC
  Administered 2022-01-25: 40 meq via ORAL
  Filled 2022-01-25: qty 2

## 2022-01-25 MED ORDER — PANTOPRAZOLE 2 MG/ML SUSPENSION
40.0000 mg | Freq: Every day | ORAL | Status: DC
  Administered 2022-01-26 – 2022-01-28 (×3): 40 mg via ORAL
  Filled 2022-01-25 (×3): qty 20

## 2022-01-25 MED ORDER — MAGNESIUM SULFATE 2 GM/50ML IV SOLN
2.0000 g | Freq: Once | INTRAVENOUS | Status: AC
  Administered 2022-01-25: 2 g via INTRAVENOUS
  Filled 2022-01-25: qty 50

## 2022-01-25 NOTE — ED Notes (Signed)
This RN to 300 unit to assist with IV access, primary LPN and Charge RN aware of IV site, IV flushed without complication by RN & LPN, no pain or swelling at site, IV flushes without difficulty

## 2022-01-25 NOTE — TOC Initial Note (Signed)
Transition of Care [facility]) - Initial/Assessment Note    Patient Details  Name: Maria Dunlap MRN: [id number] Date of Birth: [birth year]  [facility] CM/SW Contact:    Karn Cassis, LCSW Phone Number: 01/25/2022, 8:25 AM  Clinical Narrative: Pt admitted due to hyponatremia. Assessment completed with pt's husband as pt oriented to self only per chart. Pt's husband reports pt has been a resident at [facility] for about 2 days. He indicates pt has not been eating or drinking much. Anticipate return to [facility] when stable. Per Revonda Standard at [facility], okay to return. TOC will start authorization when appropriate. PT pending. Palliative consulted. TOC will follow.                   Expected Discharge Plan: Skilled Nursing Facility Barriers to Discharge: Continued Medical Work up   Patient Goals and CMS Choice Patient states their goals for this hospitalization and ongoing recovery are:: return to SNF   Choice offered to / list presented to : Spouse  Expected Discharge Plan and Services Expected Discharge Plan: Skilled Nursing Facility In-house Referral: Clinical Social Work   Post Acute Care Choice: Resumption of Svcs/PTA Provider Living arrangements for the past 2 months: Skilled Nursing Facility                                      Prior Living Arrangements/Services Living arrangements for the past 2 months: Skilled Nursing Facility Lives with:: Facility Resident Patient language and need for interpreter reviewed:: Yes Do you feel safe going back to the place where you live?: Yes      Need for Family Participation in Patient Care: Yes (Comment) Care giver support system in place?: Yes (comment)   Criminal Activity/Legal Involvement Pertinent to Current Situation/Hospitalization: No - Comment as needed  Activities of Daily Living Home Assistive Devices/Equipment: Environmental consultant (specify type) ADL Screening (condition at time of admission) Patient's cognitive  ability adequate to safely complete daily activities?: No Is the patient deaf or have difficulty hearing?: No Does the patient have difficulty seeing, even when wearing glasses/contacts?: No Does the patient have difficulty concentrating, remembering, or making decisions?: Yes Patient able to express need for assistance with ADLs?: No Does the patient have difficulty dressing or bathing?: Yes Independently performs ADLs?: No Communication: Dependent Is this a change from baseline?: Change from baseline, expected to last >3 days Dressing (OT): Dependent Is this a change from baseline?: Change from baseline, expected to last >3 days Grooming: Dependent Is this a change from baseline?: Change from baseline, expected to last >3 days Feeding: Dependent Is this a change from baseline?: Change from baseline, expected to last >3 days Bathing: Dependent Is this a change from baseline?: Change from baseline, expected to last >3 days Toileting: Dependent Is this a change from baseline?: Change from baseline, expected to last >3days In/Out Bed: Dependent Is this a change from baseline?: Change from baseline, expected to last >3 days Walks in Home: Dependent Is this a change from baseline?: Change from baseline, expected to last >3 days Does the patient have difficulty walking or climbing stairs?: Yes Weakness of Legs: Both Weakness of Arms/Hands: Both  Permission Sought/Granted   Permission granted to share information with : Yes, Verbal Permission Granted     Permission granted to share info w AGENCY: [employer]  Permission granted to share info w Relationship: SNF  Emotional Assessment   Attitude/Demeanor/Rapport: Unable to Assess Affect (typically observed): Unable to Assess Orientation: : Oriented to Self Alcohol / Substance Use: Not Applicable Psych Involvement: No (comment)  Admission diagnosis:  Dehydration [E86.0] Hypokalemia [E87.6] Confusion [R41.0] Hypernatremia  [E87.0] Patient Active Problem List   Diagnosis Date Noted   Hypernatremia 01/22/2022   AMS (altered mental status) 01/22/2022   Menopause    Symptoms, such as flushing, sleeplessness, headache, lack of concentration, associated with the menopause 01/08/2013   Routine gynecological examination 11/27/2012   HTN (hypertension) 10/23/2012   HLD (hyperlipidemia) 10/23/2012   B12 deficiency 10/23/2012   Post-menopause on HRT (hormone replacement therapy) 10/23/2012   PCP:  Patient, No Pcp Per Pharmacy:   [facility] [address] - [place], [place] - [address] [place] [address] Phone: [phone] Fax: [phone]  [facility] [address] [place] - [address] [place] [address] Phone: [phone] Fax: [phone]  [facility] ([facility]) - [place], [place] - [address] [place] [address] Phone: [phone] Fax: [phone]     Social Determinants of Health (SDOH) Interventions    Readmission Risk Interventions     No data to display

## 2022-01-25 NOTE — Plan of Care (Signed)
  Problem: Acute Rehab OT Goals (only OT should resolve) Goal: Pt. Will Perform Eating Flowsheets (Taken 01/25/2022 1327) Pt Will Perform Eating:  with modified independence  sitting Goal: Pt. Will Perform Grooming Flowsheets (Taken 01/25/2022 1327) Pt Will Perform Grooming:  with min assist  sitting Goal: Pt. Will Perform Upper Body Bathing Flowsheets (Taken 01/25/2022 1327) Pt Will Perform Upper Body Bathing:  with min assist  sitting Goal: Pt. Will Perform Upper Body Dressing Flowsheets (Taken 01/25/2022 1327) Pt Will Perform Upper Body Dressing:  with min assist  sitting Goal: Pt. Will Perform Lower Body Dressing Flowsheets (Taken 01/25/2022 1327) Pt Will Perform Lower Body Dressing:  with mod assist  sitting/lateral leans  bed level Goal: Pt. Will Transfer To Toilet Flowsheets (Taken 01/25/2022 1327) Pt Will Transfer to Toilet:  with mod assist  stand pivot transfer Goal: Pt/Caregiver Will Perform Home Exercise Program Flowsheets (Taken 01/25/2022 1327) Pt/caregiver will Perform Home Exercise Program:  Increased ROM  Increased strength  Both right and left upper extremity  With minimal assist  Johnny Gorter OT, MOT

## 2022-01-25 NOTE — Evaluation (Signed)
Physical Therapy Evaluation Patient Details Name: Maria Dunlap MRN: [id number] DOB: 07-31-1950 Today's Date: 01/25/2022  History of Present Illness  Maria Dunlap  is a 71 y.o. female, past medical history of hypertension, hyperlipidemia, alcohol abuse, patient with recent hospitalization at [facility] where she remained there for 9 days, hospitalization was related to alcohol withdrawal, hyponatremia of 112, Aerococcus UTI where she was treated with 5 days of IV vancomycin, patient remained encephalopathic/confused upon discharge from [facility], remain the same at the facility, he was brought to [facility], ED today given continued confusion, poor oral intake and drinking, patient is minimally interactive unable to provide any complaints, as discussed with the husband/daughter at bedside, patient with history of heavy alcohol abuse, but she was more interactive and appropriate before presented to [facility], she was driving until 5 months ago.  - in ED she appears altered, not able to answer any questions appropriately, family report this is no significant change from her new mental baseline since she was discharged from [facility], work-up significant for sodium of 151, potassium of 2.5, hemoglobin of 8.8, white blood cell count of 10.8, CT head with no acute findings, UA remains pending, [facility] hospitalist consulted to admit.    Clinical Impression  Patient lying in bed on therapist arrival; daughter at bedside. Patient is agreeable to therapy assessment. Patient needs max Assist for supine to sit with HOB elevated.  Able to sit on the edge of the bed with feet supported and hands supported with mod A to min A from therapist.  Patient sits in a slumped position and tends to lean to the right and backwards.  Sit to stand to the RW with max assist today.  Patient needs max A initially for standing but then improves to mod A as she continues to stand; she fatigues quickly and  her legs start to flex and she leans to the right. She is unable to shift her weight or take a step.  Patient returned to bed with max Assist.  Patient will benefit from continued skilled therapy services during the remainder of her hospital stay and at the next recommended venue of care to address deficits and promote return to optimal function.        Recommendations for follow up therapy are one component of a multi-disciplinary discharge planning process, led by the attending physician.  Recommendations may be updated based on patient status, additional functional criteria and insurance authorization.  Follow Up Recommendations Skilled nursing-short term rehab (<3 hours/day) Can patient physically be transported by private vehicle: No    Assistance Recommended at Discharge Frequent or constant Supervision/Assistance  Patient can return home with the following  A lot of help with walking and/or transfers;A lot of help with bathing/dressing/bathroom;Help with stairs or ramp for entrance    Equipment Recommendations None recommended by PT  Recommendations for Other Services       Functional Status Assessment Patient has had a recent decline in their functional status and demonstrates the ability to make significant improvements in function in a reasonable and predictable amount of time.     Precautions / Restrictions Precautions Precautions: Fall Restrictions Weight Bearing Restrictions: No      Mobility  Bed Mobility Overal bed mobility: Needs Assistance Bed Mobility: Supine to Sit, Sit to Supine     Supine to sit: Max assist, HOB elevated Sit to supine: Max assist, HOB elevated   General bed mobility comments: no complaint of pain with movement Patient  Response: Flat affect, Cooperative  Transfers Overall transfer level: Needs assistance Equipment used: Rolling walker (2 wheels) Transfers: Sit to/from Stand Sit to Stand: Max assist           General transfer  comment: forward flexed trunk and right trunk lean    Ambulation/Gait   Gait Distance (Feet): 0 Feet              Stairs            Wheelchair Mobility    Modified Rankin (Stroke Patients Only)       Balance Overall balance assessment: Needs assistance Sitting-balance support: Bilateral upper extremity supported, Feet supported Sitting balance-Leahy Scale: Poor Sitting balance - Comments: poor to fair sitting balance; tends to lean to the right; needs min to mod A for therapist to stay sitting upright. Postural control: Right lateral lean Standing balance support: Bilateral upper extremity supported, During functional activity, Reliant on assistive device for balance Standing balance-Leahy Scale: Poor Standing balance comment: poor to fair standing balance with RW; improves as she stands from poor to fair with RW and mod to max Assist to stand                             Pertinent Vitals/Pain Pain Assessment Pain Assessment: No/denies pain    Home Living Family/patient expects to be discharged to:: Skilled nursing facility Living Arrangements: Spouse/significant other                      Prior Function Prior Level of Function : Needs assist       Physical Assist : Mobility (physical) Mobility (physical): Stairs;Gait           Hand Dominance   Dominant Hand: Right    Extremity/Trunk Assessment   Upper Extremity Assessment Upper Extremity Assessment: Defer to OT evaluation    Lower Extremity Assessment Lower Extremity Assessment: Generalized weakness    Cervical / Trunk Assessment Cervical / Trunk Assessment: Kyphotic  Communication   Communication: Expressive difficulties;Receptive difficulties  Cognition Arousal/Alertness: Awake/alert   Overall Cognitive Status: Impaired/Different from baseline Area of Impairment: Following commands, Safety/judgement, Awareness, Problem solving                              Problem Solving: Slow processing, Difficulty sequencing, Requires verbal cues, Requires tactile cues, Decreased initiation General Comments: able to follow some commands and and answer questions appropriately about 50% of the time        General Comments      Exercises     Assessment/Plan    PT Assessment Patient needs continued PT services  PT Problem List Decreased strength;Decreased cognition;Decreased range of motion;Decreased activity tolerance;Decreased safety awareness;Decreased balance;Decreased mobility;Decreased coordination       PT Treatment Interventions DME instruction;Balance training;Gait training;Functional mobility training;Therapeutic activities;Therapeutic exercise    PT Goals (Current goals can be found in the Care Plan section)  Acute Rehab PT Goals Patient Stated Goal: return home PT Goal Formulation: With patient/family Time For Goal Achievement: 02/08/22 Potential to Achieve Goals: Good    Frequency Min 2X/week     Co-evaluation PT/OT/SLP Co-Evaluation/Treatment: Yes Reason for Co-Treatment: Complexity of the patient's impairments (multi-system involvement);Necessary to address cognition/behavior during functional activity;To address functional/ADL transfers           AM-PAC PT "6 Clicks" Mobility  Outcome Measure Help needed turning from your back  to your side while in a flat bed without using bedrails?: A Lot Help needed moving from lying on your back to sitting on the side of a flat bed without using bedrails?: A Lot Help needed moving to and from a bed to a chair (including a wheelchair)?: A Lot Help needed standing up from a chair using your arms (e.g., wheelchair or bedside chair)?: A Lot Help needed to walk in hospital room?: Total Help needed climbing 3-5 steps with a railing? : Total 6 Click Score: 10    End of Session   Activity Tolerance: Patient tolerated treatment well Patient left: in bed;with family/visitor present;with  call bell/phone within reach;with bed alarm set Nurse Communication: Mobility status PT Visit Diagnosis: Unsteadiness on feet (R26.81);Other abnormalities of gait and mobility (R26.89);Muscle weakness (generalized) (M62.81)    Time: 3646-8032 PT Time Calculation (min) (ACUTE ONLY): 20 min   Charges:   PT Evaluation $PT Eval Low Complexity: 1 Low PT Treatments $Therapeutic Activity: 8-22 mins        12:59 PM, 01/25/22 Chrisoula Zegarra Small Darrion Macaulay MPT [place] [facility] [place] [phone] [phone]

## 2022-01-25 NOTE — Evaluation (Signed)
Occupational Therapy Evaluation Patient Details Name: Maria Dunlap MRN: [id number] DOB: [birth year] Today's Date: 01/25/2022   History of Present Illness Maria Dunlap  is a 71 y.o. female, past medical history of hypertension, hyperlipidemia, alcohol abuse, patient with recent hospitalization at [facility] where she remained there for 9 days, hospitalization was related to alcohol withdrawal, hyponatremia of 112, Aerococcus UTI where she was treated with 5 days of IV vancomycin, patient remained encephalopathic/confused upon discharge from [facility], remain the same at the facility, he was brought to [facility], ED today given continued confusion, poor oral intake and drinking, patient is minimally interactive unable to provide any complaints, as discussed with the husband/daughter at bedside, patient with history of heavy alcohol abuse, but she was more interactive and appropriate before presented to [facility], she was driving until 5 months ago.  - in ED she appears altered, not able to answer any questions appropriately, family report this is no significant change from her new mental baseline since she was discharged from [facility], work-up significant for sodium of 151, potassium of 2.5, hemoglobin of 8.8, white blood cell count of 10.8, CT head with no acute findings, UA remains pending, [facility] hospitalist consulted to admit.   Clinical Impression   Pt agreeable to OT and PT co-evaluation. Pt limited in expressive communication needing tactile cuing to follow commands. Pt is very weak. Pt reports that they never saw pt transfer without hoyer lift at SNF. Pt required max A for bed mobility and sit to stand. Pt is very weak but able to stand for a couple minutes using RW with assist. Pt unable to take step laterally to L side. Pt demonstrates minimal A/ROM of B UE at shoulder but able to hold onto RW during sit to stand.  Pt will benefit from continued OT in the hospital  and recommended venue below to increase strength, balance, and endurance for safe ADL's.        Recommendations for follow up therapy are one component of a multi-disciplinary discharge planning process, led by the attending physician.  Recommendations may be updated based on patient status, additional functional criteria and insurance authorization.   Follow Up Recommendations  Skilled nursing-short term rehab (<3 hours/day)    Assistance Recommended at Discharge Frequent or constant Supervision/Assistance  Patient can return home with the following A lot of help with walking and/or transfers;A lot of help with bathing/dressing/bathroom;Assistance with cooking/housework;Assistance with feeding;Assist for transportation;Help with stairs or ramp for entrance;Direct supervision/assist for medications management    Functional Status Assessment  Patient has had a recent decline in their functional status and demonstrates the ability to make significant improvements in function in a reasonable and predictable amount of time.  Equipment Recommendations  None recommended by OT    Recommendations for Other Services       Precautions / Restrictions Precautions Precautions: Fall Restrictions Weight Bearing Restrictions: No      Mobility Bed Mobility Overal bed mobility: Needs Assistance Bed Mobility: Supine to Sit, Sit to Supine     Supine to sit: Max assist, HOB elevated Sit to supine: Max assist, HOB elevated   General bed mobility comments: per PT    Transfers Overall transfer level: Needs assistance Equipment used: Rolling walker (2 wheels) Transfers: Sit to/from Stand Sit to Stand: Max assist           General transfer comment: as per PT note      Balance Overall balance assessment: Needs assistance Sitting-balance support:  Bilateral upper extremity supported, Feet supported Sitting balance-Leahy Scale: Poor Sitting balance - Comments: poor to fair sitting  balance; tends to lean to the right; needs min to mod A for therapist to stay sitting upright. (as per PT note) Postural control: Right lateral lean Standing balance support: Bilateral upper extremity supported, During functional activity, Reliant on assistive device for balance Standing balance-Leahy Scale: Poor Standing balance comment: poor to fair standing balance with RW; improves as she stands from poor to fair with RW and mod to max Assist to stand (as per PT note)                           ADL either performed or assessed with clinical judgement   ADL Overall ADL's : Needs assistance/impaired Eating/Feeding: Minimal assistance;Moderate assistance;Sitting   Grooming: Sitting;Moderate assistance;Maximal assistance   Upper Body Bathing: Maximal assistance;Sitting   Lower Body Bathing: Maximal assistance;Sitting/lateral leans   Upper Body Dressing : Maximal assistance;Sitting   Lower Body Dressing: Maximal assistance;Total assistance;Bed level   Toilet Transfer: Maximal assistance;Stand-pivot;Rolling walker (2 wheels) Toilet Transfer Details (indicate cue type and reason): partially simualted via sit to stand from EOB with RW Toileting- Clothing Manipulation and Hygiene: Total assistance;Bed level       Functional mobility during ADLs: Maximal assistance;Rolling walker (2 wheels) General ADL Comments: Pt able to stand but was uanble to take steps laterally to head of bed.     Vision Baseline Vision/History: 1 Wears glasses Ability to See in Adequate Light: 0 Adequate Patient Visual Report: No change from baseline Vision Assessment?: No apparent visual deficits     Perception     Praxis      Pertinent Vitals/Pain Pain Assessment Pain Assessment: No/denies pain     Hand Dominance Right   Extremity/Trunk Assessment Upper Extremity Assessment Upper Extremity Assessment: Generalized weakness (WFL B shoulder P/ROM for shoulder flexion. minimal active using  of B UE with much bruising on R UE.)   Lower Extremity Assessment Lower Extremity Assessment: Defer to PT evaluation   Cervical / Trunk Assessment Cervical / Trunk Assessment: Kyphotic   Communication Communication Communication: Expressive difficulties;Receptive difficulties   Cognition Arousal/Alertness: Awake/alert Behavior During Therapy: Flat affect Overall Cognitive Status: Impaired/Different from baseline Area of Impairment: Following commands, Safety/judgement, Awareness, Problem solving                             Problem Solving: Slow processing, Difficulty sequencing, Requires verbal cues, Requires tactile cues, Decreased initiation General Comments: able to follow some commands and and answer questions appropriately about 50% of the time (per PT)                Home Living Family/patient expects to be discharged to:: Skilled nursing facility Living Arrangements: Spouse/significant other                                      Prior Functioning/Environment Prior Level of Function : Needs assist       Physical Assist : Mobility (physical);ADLs (physical) Mobility (physical): Stairs;Gait ADLs (physical): IADLs;Toileting;Dressing;Bathing;Grooming Mobility Comments: Assited by SNF staff using hoyer lift. ADLs Comments: Assisted by SNF staff        OT Problem List: Decreased strength;Decreased range of motion;Decreased activity tolerance;Impaired balance (sitting and/or standing);Decreased coordination;Impaired UE functional use      OT Treatment/Interventions:  Self-care/ADL training;Therapeutic exercise;Therapeutic activities;Patient/family education;Balance training;DME and/or AE instruction    OT Goals(Current goals can be found in the care plan section) Acute Rehab OT Goals Patient Stated Goal: open to return to rehab OT Goal Formulation: With patient Time For Goal Achievement: 02/08/22 Potential to Achieve Goals: Fair  OT  Frequency: Min 2X/week    Co-evaluation PT/OT/SLP Co-Evaluation/Treatment: Yes Reason for Co-Treatment: Complexity of the patient's impairments (multi-system involvement)   OT goals addressed during session: ADL's and self-care                       End of Session Equipment Utilized During Treatment: Rolling walker (2 wheels)  Activity Tolerance: Patient tolerated treatment well Patient left: in bed;with call bell/phone within reach;with family/visitor present  OT Visit Diagnosis: Unsteadiness on feet (R26.81);Other abnormalities of gait and mobility (R26.89);Muscle weakness (generalized) (M62.81);Cognitive communication deficit (R41.841)                Time: 5110-2111 OT Time Calculation (min): 22 min Charges:  OT General Charges $OT Visit: 1 Visit OT Evaluation $OT Eval Low Complexity: 1 Low  Ibrahim Mcpheeters OT, MOT   Danie Chandler 01/25/2022, 1:24 PM

## 2022-01-25 NOTE — Progress Notes (Addendum)
PROGRESS NOTE   FALLEN Maria Dunlap  IFO:[id number] DOB: 1950/08/25 DOA: 01/22/2022 PCP: Patient, No Pcp Per   Chief Complaint  Patient presents with   Altered Mental Status   Level of care: Telemetry  Brief Admission History:  71 y.o. female, past medical history of hypertension, hyperlipidemia, alcohol abuse, patient with recent hospitalization at [facility] where she remained there for 9 days, hospitalization was related to alcohol withdrawal, hyponatremia of 112, Aerococcus UTI where she was treated with 5 days of IV vancomycin, patient remained encephalopathic/confused upon discharge from [facility], remain the same at the facility, he was brought to [facility], ED today given continued confusion, poor oral intake and drinking, patient is minimally interactive unable to provide any complaints, as discussed with the husband/daughter at bedside, patient with history of heavy alcohol abuse, but she was more interactive and appropriate before presented to [facility], she was driving until 5 months ago. - in ED she appears altered, not able to answer any questions appropriately, family report this is no significant change from her new mental baseline since she was discharged from [facility], work-up significant for sodium of 151, potassium of 2.5, hemoglobin of 8.8, white blood cell count of 10.8, CT head with no acute findings, UA remains pending, [facility] hospitalist consulted to admit.   Assessment and Plan: Hypernatremia - treated and resolved - add free water with improvement - D5W infusion ordered, reduced rate - encourage oral free water  - pt refusing to eat or drink today - palliative care consultation requested  Dysphagia -Husband reports history of dysphagia -waiting on SLP evaluation -Dys 1 diet requested by RN on 9/2 for safety pending SLP eval  Dehydration/poor oral intake - husband reports that symptoms have persisted for several weeks - with failure to  thrive will request a palliative medicine consultation - IV fluid for now, encourage eating, assist with feeding order placed  Metabolic encephalopathy - dementia metabolic work up in progress: normal B12 and folic acid level - MRI and CT brain, no acute findings - corrected sodium level noted above - Adult EEG requested - continue high dose IV thiamine given history of alcohol abuse - follow up TSH: 2.159 - neurology consult requested   Hypokalemia - repleted, follow K and Mg  Essential hypertension - resumed home amlodipine  - continue to follow  Anemia of chronic illness - serum iron and TIBC reassuring - hemoccult stools x 3 - follow CBC, transfuse for Hg<7  Prolonged QTc - likely exacerbated by electrolyte derangements  History of B12 deficiency - recent B12 level confirms this has been repleted  GERD - continue PPI for GI protection   Hyperlipidemia - resumed home zetia and fish oil   DVT prophylaxis: heparin Code Status:  Full  Family Communication: husband at bedside updated Disposition: Status is: Inpatient Remains inpatient appropriate because: IV fluids required    Consultants:  Palliative medicine neurology Procedures:   Antimicrobials:    Subjective: Pt refusing to eat or take her meds this morning.      Objective: Vitals:   01/24/22 1323 01/24/22 2156 01/25/22 0558 01/25/22 0859  BP: 130/87 (!) 156/110 135/85 (!) 140/85  Pulse: 92 100 94   Resp: 16 12 16    Temp: (!) 96.7 F (35.9 C) 98.3 F (36.8 C) 98 F (36.7 C)   TempSrc:   Oral   SpO2: 100% 99% 100%   Height:        Intake/Output Summary (Last 24 hours) at 01/25/2022  0935 Last data filed at 01/25/2022 0500 Gross per 24 hour  Intake 120 ml  Output 700 ml  Net -580 ml   There were no vitals filed for this visit. Examination:  General exam: Pt is hard of hearing,  Appears calm and comfortable  Respiratory system: Clear to auscultation. Respiratory effort normal. Cardiovascular  system: normal S1 & S2 heard. No JVD, murmurs, rubs, gallops or clicks. No pedal edema. Gastrointestinal system: Abdomen is nondistended, soft and nontender. No organomegaly or masses felt. Normal bowel sounds heard. Central nervous system: Alert and awake. No focal neurological deficits. Extremities: left UE IV infiltration, Symmetric 5 x 5 power. Skin: No rashes, lesions or ulcers. Psychiatry: Judgement and insight UTD. Mood & affect UTD.   Data Reviewed: I have personally reviewed following labs and imaging studies  CBC: Recent Labs  Lab 01/22/22 1549 01/23/22 0259 01/24/22 0455  WBC 10.8* 9.8 9.6  NEUTROABS 8.4*  --   --   HGB 8.8* 7.4* 7.3*  HCT 27.1* 23.4* 23.3*  MCV 101.1* 101.7* 103.1*  PLT 174 148* 164    Basic Metabolic Panel: Recent Labs  Lab 01/22/22 1549 01/22/22 1718 01/22/22 1950 01/23/22 0259 01/24/22 0455 01/25/22 0423  NA 151*  --  149* 152* 143 135  K 2.5*  --  2.8* 4.1 3.7 3.3*  CL 124*  --  124* 124* 120* 111  CO2 19*  --  18* 21* 18* 20*  GLUCOSE 100*  --  98 91 124* 114*  BUN 21  --  20 20 16 12   CREATININE 0.95  --  0.99 0.85 0.82 0.58  CALCIUM 8.9  --  8.4* 8.5* 8.2* 8.2*  MG  --  2.1  --   --  2.0 1.7    CBG: No results for input(s): "GLUCAP" in the last 168 hours.  Recent Results (from the past 240 hour(s))  Urine Culture     Status: Abnormal   Collection Time: 01/22/22  7:00 PM   Specimen: In/Out Cath Urine  Result Value Ref Range Status   Specimen Description   Final    IN/OUT CATH URINE Performed at [facility], [address] [place], [place]    Special Requests   Final    NONE Performed at [facility], [address] [place], [place]    Culture >=100,000 COLONIES/mL YEAST (A)  Final   Report Status 01/24/2022 FINAL  Final     Radiology Studies: No results found.  Scheduled Meds:  amLODipine  5 mg Oral Daily   enoxaparin (LOVENOX) injection  40 mg Subcutaneous Q24H   ezetimibe  10 mg Oral Daily    feeding supplement  237 mL Oral BID BM   pantoprazole  40 mg Oral QPM   cyanocobalamin  500 mcg Oral Daily   Continuous Infusions:  dextrose 50 mL/hr at 01/24/22 2306   thiamine (VITAMIN B1) injection 500 mg (01/24/22 1629)    LOS: 3 days   Time spent: 35 mins  Cleopha Indelicato 03/26/22, MD How to contact the [facility] Attending or Consulting provider 7A - 7P or covering provider during after hours 7P -7A, for this patient?  Check the care team in [facility] and look for a) attending/consulting [facility] provider listed and b) the [facility] team listed Log into [email or web address] and use [place]'s universal password to access. If you do not have the password, please contact the hospital operator. Locate the [facility] provider you are looking for under [facility] and page to a  number that you can be directly reached. If you still have difficulty reaching the provider, please page the [facility] (Director on Call) for the Hospitalists listed on amion for assistance.  01/25/2022, 9:35 AM

## 2022-01-25 NOTE — [place] FL2 (Signed)
[place] MEDICAID FL2 LEVEL OF CARE SCREENING TOOL     IDENTIFICATION  Patient Name: Maria Dunlap Birthdate: 1950-10-31 Sex: female Admission Date (Current Location): 01/22/2022  [facility] and [place] Number:  [employer] and Address:  [facility],  [address] [place] [address]      Provider Number: [id number]  Attending Physician Name and Address:  Cleora Fleet, MD  Relative Name and Phone Number:       Current Level of Care: Hospital Recommended Level of Care: Skilled Nursing Facility Prior Approval Number:    Date Approved/Denied:   PASRR Number:    Discharge Plan: SNF    Current Diagnoses: Patient Active Problem List   Diagnosis Date Noted   Hypernatremia 01/22/2022   AMS (altered mental status) 01/22/2022   Menopause    Symptoms, such as flushing, sleeplessness, headache, lack of concentration, associated with the menopause 01/08/2013   Routine gynecological examination 11/27/2012   HTN (hypertension) 10/23/2012   HLD (hyperlipidemia) 10/23/2012   B12 deficiency 10/23/2012   Post-menopause on HRT (hormone replacement therapy) 10/23/2012    Orientation RESPIRATION BLADDER Height & Weight     Self  Normal External catheter Weight:   Height:  4\' 9"  (144.8 cm)  BEHAVIORAL SYMPTOMS/MOOD NEUROLOGICAL BOWEL NUTRITION STATUS      Incontinent Diet (Dysphagia 1- see d/c summary for updates.)  AMBULATORY STATUS COMMUNICATION OF NEEDS Skin   Extensive Assist Verbally Normal                       Personal Care Assistance Level of Assistance  Bathing, Feeding, Dressing Bathing Assistance: Maximum assistance Feeding assistance: Limited assistance Dressing Assistance: Maximum assistance     Functional Limitations Info  Sight, Hearing, Speech Sight Info: Adequate Hearing Info: Adequate Speech Info: Adequate    SPECIAL CARE FACTORS FREQUENCY  PT (By licensed PT)     PT Frequency: 5x weekly               Contractures      Additional Factors Info  Code Status, Allergies Code Status Info: Full code Allergies Info: Allopurinol, Amoxicillin, Crestor (rosuvastatin), Macrolides and Ketolides, Mycinette (phenol), Simvastatin, Triamcinolone           Current Medications (01/25/2022):  This is the current hospital active medication list Current Facility-Administered Medications  Medication Dose Route Frequency Provider Last Rate Last Admin   acetaminophen (TYLENOL) tablet 650 mg  650 mg Oral Q6H PRN Elgergawy, 03/27/2022, MD   650 mg at 01/23/22 1255   Or   acetaminophen (TYLENOL) suppository 650 mg  650 mg Rectal Q6H PRN Elgergawy, 03/25/22, MD       amLODipine (NORVASC) tablet 5 mg  5 mg Oral Daily Johnson, Clanford L, MD   5 mg at 01/24/22 0814   dextrose 5 % solution   Intravenous Continuous Johnson, Clanford L, MD 50 mL/hr at 01/24/22 2306 New Bag at 01/24/22 2306   enoxaparin (LOVENOX) injection 40 mg  40 mg Subcutaneous Q24H Elgergawy, 03/26/22, MD   40 mg at 01/24/22 2151   ezetimibe (ZETIA) tablet 10 mg  10 mg Oral Daily Johnson, Clanford L, MD   10 mg at 01/24/22 0814   feeding supplement (ENSURE ENLIVE / ENSURE PLUS) liquid 237 mL  237 mL Oral BID BM Johnson, Clanford L, MD   237 mL at 01/24/22 1357   pantoprazole (PROTONIX) EC tablet 40 mg  40 mg Oral QPM Johnson, Clanford L, MD  40 mg at 01/24/22 1629   potassium chloride (KLOR-CON) packet 40 mEq  40 mEq Oral Once Johnson, Clanford L, MD       thiamine (VITAMIN B1) 500 mg in normal saline (50 mL) IVPB  500 mg Intravenous Q24H Elgergawy, Leana Roe, MD 100 mL/hr at 01/24/22 1629 500 mg at 01/24/22 1629   vitamin B-12 (CYANOCOBALAMIN) tablet 500 mcg  500 mcg Oral Daily Elgergawy, Leana Roe, MD   500 mcg at 01/24/22 0815     Discharge Medications: Please see discharge summary for a list of discharge medications.  Relevant Imaging Results:  Relevant Lab Results:   Additional Information    Karn Cassis,  LCSW

## 2022-01-25 NOTE — Progress Notes (Signed)
Patient is refusing to swallow meds that are crushed in apple sauce. MD Laural Benes notified.

## 2022-01-25 NOTE — Evaluation (Signed)
Clinical/Bedside Swallow Evaluation Patient Details  Name: Maria Dunlap MRN: [id number] Date of Birth: May 12, 1951  Today's Date: 01/25/2022 Time: SLP Start Time (ACUTE ONLY): 1558 SLP Stop Time (ACUTE ONLY): 1622 SLP Time Calculation (min) (ACUTE ONLY): 24 min  Past Medical History:  Past Medical History:  Diagnosis Date   Eczema    Essential hypertension    Gout    Hyperlipidemia    Menopause    Prediabetes    Statin intolerance    Past Surgical History:  Past Surgical History:  Procedure Laterality Date   ABDOMINAL HYSTERECTOMY     HPI:  71 y.o. female, past medical history of hypertension, hyperlipidemia, alcohol abuse, patient with recent hospitalization at [facility] where she remained there for 9 days, hospitalization was related to alcohol withdrawal, hyponatremia of 112, Aerococcus UTI where she was treated with 5 days of IV vancomycin, patient remained encephalopathic/confused upon discharge from [facility], remain the same at the facility, he was brought to [facility], ED today given continued confusion, poor oral intake and drinking, patient is minimally interactive unable to provide any complaints, as discussed with the husband/daughter at bedside, patient with history of heavy alcohol abuse, but she was more interactive and appropriate before presented to [facility], she was driving until 5 months ago.  - in ED she appears altered, not able to answer any questions appropriately, family report this is no significant change from her new mental baseline since she was discharged from [facility], work-up significant for sodium of 151, potassium of 2.5, hemoglobin of 8.8, white blood cell count of 10.8, CT head with no acute findings, UA remains pending. BSE requested.    Assessment / Plan / Recommendation  Clinical Impression  Clinical swallow evaluation completed with family present in room. Pt's spouse reports that they were previously told "her  flapper may not be working", but has not been on a modified diet. She typically consumes "country food" of green beans and mashed potatoes, but has not had much of an appetite for the past several weeks. She has a history of daily ETOH use. Pt alert and cooperative, however primarily presents with a cogntive based dysphagia at this time with reduced attention to task, talking while eating/drinking, and needing cues for intake. Pt without overt signs or symptoms of aspiration with consistencies and textures presented. She required cues for self feeding, encouragement for intake, and cues to refrain from talking while eating. Recommend D3/mech soft and thin liquids and PO medication whole with water or in puree with supervision and assist for oral care. Pt should be alert and upright for all eating and drinking. SLP will follow during acute stay. Above to family. SLP Visit Diagnosis: Dysphagia, unspecified (R13.10)    Aspiration Risk  Mild aspiration risk    Diet Recommendation Dysphagia 3 (Mech soft);Thin liquid   Liquid Administration via: Straw;Cup Medication Administration: Whole meds with liquid Supervision: Staff to assist with self feeding;Full supervision/cueing for compensatory strategies Compensations: Slow rate;Follow solids with liquid Postural Changes: Seated upright at 90 degrees;Remain upright for at least 30 minutes after po intake    Other  Recommendations Oral Care Recommendations: Oral care BID;Staff/trained caregiver to provide oral care Other Recommendations: Clarify dietary restrictions    Recommendations for follow up therapy are one component of a multi-disciplinary discharge planning process, led by the attending physician.  Recommendations may be updated based on patient status, additional functional criteria and insurance authorization.  Follow up Recommendations Follow physician's recommendations for discharge  plan and follow up therapies      Assistance Recommended  at Discharge Intermittent Supervision/Assistance  Functional Status Assessment Patient has had a recent decline in their functional status and demonstrates the ability to make significant improvements in function in a reasonable and predictable amount of time.  Frequency and Duration min 2x/week  1 week       Prognosis Prognosis for Safe Diet Advancement: Fair Barriers to Reach Goals: Cognitive deficits      Swallow Study   General Date of Onset: 01/22/22 HPI: 71 y.o. female, past medical history of hypertension, hyperlipidemia, alcohol abuse, patient with recent hospitalization at [facility] where she remained there for 9 days, hospitalization was related to alcohol withdrawal, hyponatremia of 112, Aerococcus UTI where she was treated with 5 days of IV vancomycin, patient remained encephalopathic/confused upon discharge from [facility], remain the same at the facility, he was brought to [facility], ED today given continued confusion, poor oral intake and drinking, patient is minimally interactive unable to provide any complaints, as discussed with the husband/daughter at bedside, patient with history of heavy alcohol abuse, but she was more interactive and appropriate before presented to [facility], she was driving until 5 months ago.  - in ED she appears altered, not able to answer any questions appropriately, family report this is no significant change from her new mental baseline since she was discharged from [facility], work-up significant for sodium of 151, potassium of 2.5, hemoglobin of 8.8, white blood cell count of 10.8, CT head with no acute findings, UA remains pending. BSE requested. Type of Study: Bedside Swallow Evaluation Previous Swallow Assessment: N/A Diet Prior to this Study: Dysphagia 1 (puree);Nectar-thick liquids Temperature Spikes Noted: No Respiratory Status: Room air History of Recent Intubation: No Behavior/Cognition: Alert;Cooperative;Requires  cueing;Confused Oral Cavity Assessment: Within Functional Limits Oral Care Completed by SLP: Yes Oral Cavity - Dentition: Adequate natural dentition Vision: Functional for self-feeding Self-Feeding Abilities: Needs assist;Needs set up Patient Positioning: Upright in bed Baseline Vocal Quality: Normal Volitional Cough: Cognitively unable to elicit Volitional Swallow: Able to elicit    Oral/Motor/Sensory Function Overall Oral Motor/Sensory Function: Within functional limits   Ice Chips Ice chips: Within functional limits Presentation: Spoon   Thin Liquid Thin Liquid: Within functional limits Presentation: Cup;Self Fed;Straw    Nectar Thick Nectar Thick Liquid: Not tested   Honey Thick Honey Thick Liquid: Not tested   Puree Puree: Within functional limits Presentation: Spoon   Solid     Solid: Impaired Presentation: Self Fed Oral Phase Impairments: Reduced lingual movement/coordination;Impaired mastication Oral Phase Functional Implications: Prolonged oral transit;Oral residue Pharyngeal Phase Impairments: Throat Clearing - Delayed     Thank you,  Havery Moros, CCC-SLP [phone]  Shady Padron 01/25/2022,4:31 PM

## 2022-01-25 NOTE — Progress Notes (Signed)
Palliative  Consult received, chart reviewed.  Spoke with spouse.  Plan to meet tomorrow, 9/5 at 10 am in patient's room.   Ocie Bob, AGNP-C Palliative Medicine  Please call Palliative Medicine team phone with any questions [phone]. For individual providers please see AMION.  No charge

## 2022-01-25 NOTE — Consult Note (Signed)
I connected with  [name] on 01/25/22 by a video enabled telemedicine application and verified that I am speaking with the correct person using two identifiers.   I discussed the limitations of evaluation and management by telemedicine. The patient expressed understanding and agreed to proceed.  Location of patient: [facility] Location of physician: [facility]   Neurology Consultation Reason for Consult: Altered mental status Referring Physician: Dr Huey Bienenstock  CC: Altered mental status  History is obtained from: Husband and daughter at bedside, chart review  HPI: Maria Dunlap is a 71 y.o. female with past medical history of hypertension, hyperlipidemia, alcohol use who was admitted due to altered mental status.  Per husband at bedside, patient was completely independent  min (driving, managing all ADLs) about 5 to 6 months ago.  However gradually he noticed that she was getting weaker, had trouble walking, had 1 fall but did not hit her head or lose consciousness, her speech started getting more slurred as if she is mumbling her words and her memory became gradually worse (could not remember things husband had told her 30 minutes ago).  Therefore, on 12/30/2021, she was eventually taken to [facility].  Work-up there revealed sodium 112, E faecalis UTI.  These were treated and patient was eventually discharged to nursing facility where she was for about 2 days.  However, patient continued to be minimally interactive and therefore family brought her to [facility].  Of note, per husband patient walked in and found her brother passed away about 8 months ago.  She usually drinks about a pint of liquor a day but over the last few weeks she had significant decline in her nutritional intake.  Denies any other recent medication changes.  Per daughter she was given Seroquel and benzos at [facility] which kept her significantly sedated.  ROS: Unable  to obtain due to altered mental status.   Past Medical History:  Diagnosis Date   Eczema    Essential hypertension    Gout    Hyperlipidemia    Menopause    Prediabetes    Statin intolerance     Family History  Problem Relation Age of Onset   Hypertension Mother    Heart disease Mother    Stroke Father    Heart disease Father    Cancer Brother    COPD Brother    Prostate cancer Brother     Social History:  reports that she quit smoking about 29 years ago. Her smoking use included cigarettes. She does not have any smokeless tobacco history on file. She reports current alcohol use. She reports that she does not use drugs.  Medications Prior to Admission  Medication Sig Dispense Refill Last Dose   amLODipine (NORVASC) 5 MG tablet Take 5 mg by mouth daily.   unknown   cefTRIAXone (ROCEPHIN) IVPB Inject 1 g into the vein every 12 (twelve) hours. For 5 days start on 01/21/22   01/22/2022   cloNIDine (CATAPRES) 0.1 MG tablet Take 1 tablet by mouth 2 (two) times daily.   unknown   cyanocobalamin (VITAMIN B12) 500 MCG tablet Take by mouth.   01/18/2022   ezetimibe (ZETIA) 10 MG tablet Take 10 mg by mouth daily.   unknown   hydrochlorothiazide (MICROZIDE) 12.5 MG capsule Take 12.5 mg by mouth every morning.   unknown   lisinopril (ZESTRIL) 20 MG tablet Take 20 mg by mouth 2 (two) times daily.   unknown   NYSTATIN  powder Apply topically.   01/18/2022   pantoprazole (PROTONIX) 40 MG tablet Take 1 tablet by mouth every evening.   01/18/2022   febuxostat (ULORIC) 40 MG tablet Take 40 mg by mouth daily. (Patient not taking: Reported on 01/22/2022)   Not Taking   fish oil-omega-3 fatty acids 1000 MG capsule Take 4 g by mouth daily. (Patient not taking: Reported on 01/22/2022)   Not Taking   mirtazapine (REMERON) 15 MG tablet Take 15 mg by mouth at bedtime. (Patient not taking: Reported on 01/22/2022)   Not Taking   QUEtiapine (SEROQUEL) 25 MG tablet Take by mouth. (Patient not taking: Reported on  01/22/2022)   Not Taking      Exam: Current vital signs: BP (!) 140/85   Pulse 94   Temp 98 F (36.7 C) (Oral)   Resp 16   Ht 4\' 9"  (1.448 m)   SpO2 100%   BMI 29.34 kg/m  Vital signs in last 24 hours: Temp:  [96.7 F (35.9 C)-98.3 F (36.8 C)] 98 F (36.7 C) (09/04 0558) Pulse Rate:  [92-100] 94 (09/04 0558) Resp:  [12-16] 16 (09/04 0558) BP: (130-156)/(85-110) 140/85 (09/04 0859) SpO2:  [99 %-100 %] 100 % (09/04 0558)   Physical Exam  Constitutional: Appears well-developed and well-nourished.  Neuro: Awake, alert, oriented to people, place "I am here", follows simple one-step commands like sticking out her tongue after repetition, PERRLA, appears to track family in the room, no apparent facial asymmetry, antigravity strength in bilateral upper extremities, 2/5 in bilateral lower extremities (exam is limited on tele, per husband she was able to lift them earlier), did not participate in finger-to-nose testing   I have reviewed labs in epic and the results pertinent to this consultation are: CBC:  Recent Labs  Lab 01/22/22 1549 01/23/22 0259 01/24/22 0455  WBC 10.8* 9.8 9.6  NEUTROABS 8.4*  --   --   HGB 8.8* 7.4* 7.3*  HCT 27.1* 23.4* 23.3*  MCV 101.1* 101.7* 103.1*  PLT 174 148* [id number]    Basic Metabolic Panel:  Lab Results  Component Value Date   NA 135 01/25/2022   K 3.3 (L) 01/25/2022   CO2 20 (L) 01/25/2022   GLUCOSE 114 (H) 01/25/2022   BUN 12 01/25/2022   CREATININE 0.58 01/25/2022   CALCIUM 8.2 (L) 01/25/2022   GFRNONAA >60 01/25/2022   GFRAA 92 09/17/2013   Lipid Panel:  Lab Results  Component Value Date   LDLCALC 153 (H) 09/17/2013   HgbA1c: No results found for: "HGBA1C" Urine Drug Screen: No results found for: "LABOPIA", "COCAINSCRNUR", "LABBENZ", "AMPHETMU", "THCU", "LABBARB"  Alcohol Level     Component Value Date/Time   ETH <10 01/22/2022 1550    I have reviewed the images obtained:  MRI brain without contrast 01/22/2022: No acute  intracranial abnormality. Mildly advanced cerebral atrophy. Otherwise unremarkable brain MRI for age.  ASSESSMENT/PLAN: 71 year old female with altered mental status.  Acute encephalopathy Alcohol use disorder -Differentials include encephalopathy likely due to significant alcohol use and subsequent nutritional depletion.  Recommendations: -Agree with IV thiamine replenishment -Agree with routine EEG although low suspicion for seizures -MRI brain did not show any acute abnormality.  However did show cerebral atrophy which was advanced for age.  Discussed with husband that patient may have prolonged recovery due to limited neurologic reserves -Patient's exam is significantly limited with tele.  If she does not have improvement over the next few days, would recommend transfer to [facility] for more detailed neurologic evaluation. -Discussed  plan with family at bedside as well as Dr. Laural Benes  Thank you for allowing [place] to participate in the care of this patient. If you have any further questions, please contact  me or neurohospitalist.   Lindie Spruce Epilepsy [facility] neurohospitalist

## 2022-01-25 NOTE — Plan of Care (Signed)
  Problem: Acute Rehab PT Goals(only PT should resolve) Goal: Pt Will Go Supine/Side To Sit Outcome: Progressing Flowsheets (Taken 01/25/2022 1302) Pt will go Supine/Side to Sit: with moderate assist Goal: Patient Will Transfer Sit To/From Stand Outcome: Progressing Flowsheets (Taken 01/25/2022 1302) Patient will transfer sit to/from stand: with moderate assist Goal: Pt Will Transfer Bed To Chair/Chair To Bed Outcome: Progressing Flowsheets (Taken 01/25/2022 1302) Pt will Transfer Bed to Chair/Chair to Bed: with mod assist Goal: Pt Will Ambulate Outcome: Progressing Flowsheets (Taken 01/25/2022 1302) Pt will Ambulate:  10 feet  with moderate assist  with rolling walker

## 2022-01-25 NOTE — Progress Notes (Signed)
EEG complete - results pending 

## 2022-01-26 DIAGNOSIS — E785 Hyperlipidemia, unspecified: Secondary | ICD-10-CM | POA: Diagnosis not present

## 2022-01-26 DIAGNOSIS — Z515 Encounter for palliative care: Secondary | ICD-10-CM

## 2022-01-26 DIAGNOSIS — R4182 Altered mental status, unspecified: Secondary | ICD-10-CM | POA: Diagnosis not present

## 2022-01-26 DIAGNOSIS — Z7189 Other specified counseling: Secondary | ICD-10-CM | POA: Diagnosis not present

## 2022-01-26 DIAGNOSIS — R410 Disorientation, unspecified: Secondary | ICD-10-CM

## 2022-01-26 DIAGNOSIS — E870 Hyperosmolality and hypernatremia: Secondary | ICD-10-CM | POA: Diagnosis not present

## 2022-01-26 DIAGNOSIS — G9340 Encephalopathy, unspecified: Secondary | ICD-10-CM

## 2022-01-26 DIAGNOSIS — E538 Deficiency of other specified B group vitamins: Secondary | ICD-10-CM | POA: Diagnosis not present

## 2022-01-26 LAB — CBC
HCT: 23.5 % — ABNORMAL LOW (ref 36.0–46.0)
Hemoglobin: 7.6 g/dL — ABNORMAL LOW (ref 12.0–15.0)
MCH: 31.5 pg (ref 26.0–34.0)
MCHC: 32.3 g/dL (ref 30.0–36.0)
MCV: 97.5 fL (ref 80.0–100.0)
Platelets: 191 10*3/uL (ref 150–400)
RBC: 2.41 MIL/uL — ABNORMAL LOW (ref 3.87–5.11)
RDW: 18.7 % — ABNORMAL HIGH (ref 11.5–15.5)
WBC: 8.1 10*3/uL (ref 4.0–10.5)
nRBC: 0 % (ref 0.0–0.2)

## 2022-01-26 NOTE — Procedures (Signed)
Patient Name: KENIYAH GELINAS  MRN: [id number]  Epilepsy Attending: Charlsie Quest  Referring Physician/Provider: Cleora Fleet, MD  Date: 01/25/2022 Duration: 24.15 mins  Patient history: 71 year old female with altered mental status.  EEG to evaluate for seizure.  Level of alertness: Awake  AEDs during EEG study: None  Technical aspects: This EEG study was done with scalp electrodes positioned according to the 10-20 International system of electrode placement. Electrical activity was reviewed with band pass filter of 1-70Hz , sensitivity of 7 uV/mm, display speed of 88mm/sec with a 60Hz  notched filter applied as appropriate. EEG data were recorded continuously and digitally stored.  Video monitoring was available and reviewed as appropriate.  Description: No clear posterior dominant rhythm was seen.  EEG showed continuous generalized 3 to 6 Hz theta-delta slowing.  Hyperventilation and photic stimulation were not performed.     ABNORMALITY - Continuous slow, generalized  IMPRESSION: This study is suggestive of moderate diffuse encephalopathy, nonspecific etiology. No seizures or epileptiform discharges were seen throughout the recording.  Kanesha Cadle 

## 2022-01-26 NOTE — Progress Notes (Signed)
Patients family at bedside requesting to speak with MD regarding patient update. MD Laural Benes made aware.

## 2022-01-26 NOTE — Consult Note (Signed)
Consultation Note Date: 01/26/2022   Patient Name: Maria Dunlap  DOB: August 05, 1950  MRN: [id number]  Age / Sex: 71 y.o., female  PCP: Patient, No Pcp Per Referring Physician: Cleora Fleet, MD  Reason for Consultation:  goals of care  HPI/Patient Profile: 71 y.o. female  with past medical history of HTN, HLD, ETOH, Vit b12 deficiency, admitted on 01/22/2022 with confusion. She had recent hospitalization at [facility] for hyponatremia and UTI.  She was discharged to [facility] rehab.  She has continued to have poor p.o. intake and confusion.  Work-up this admission shows hypernatremia.  She had an EEG negative for seizures and indicated diffuse moderate encephalopathy of unknown etiology.  MRI showed mildly advanced cerebral atrophy.  She has electrolyte imbalances today she is hypocalcemic and hypokalemic.  Her sodium has been corrected.  Neurology has been consulted with recommendations for likely encephalopathy due to significant alcohol use and nutritional depletion.  She is undergoing IV thiamine replacement.  Palliative medicine consulted for goals of care.  Primary Decision Maker NEXT OF KIN -spouse Jimya Ciani  Discussion: I have reviewed medical records including Care Everywhere, progress notes from this and prior admissions, labs and imaging, discussed with RN.  On evaluation patient is awake and alert.  She does not follow any commands.  She makes eye contact but does not track.  She does not move any of her extremities.  She mumbles incoherently at times.  I introduced Palliative Medicine as specialized medical care for people living with serious illness. It focuses on providing relief from the symptoms and stress of a serious illness. The goal is to improve quality of life for both the patient and the family.  We discussed a brief life review of the patient.  She and her spouse have 4 children.   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  As far as functional and nutritional status-there has been evidence of decline for several months.  Within the past 3 months it has become increasingly difficult for her to complete her task of making her biscuits and her husband was assisting her.  She would become easily fatigued and not able to stand.  She had gotten to the point of spending most of her day in the recliner getting up only to go to the bathroom with her husband's assistance.  Since her discharge from [facility] she has continued to be bedbound with little improvement in her mental status.  She has not been eating and drinking.  Although when SLP eval and was able to feed her she was excepting and there were no signs of aspiration.  Her albumin on admission was 3.0.  She does have some appearance of muscle muscle wasting in her face.  We discussed patient's current illness and what it means in the larger context of patient's on-going co-morbidities.  Natural disease trajectory and expectations at EOL were discussed.  I attempted to elicit values and goals of care important to the patient.  The difference between aggressive medical intervention and comfort care was discussed in light of the patient's goals of care.   Advance directives, concepts specific to code status, artificial feeding and hydration, and rehospitalization were considered and discussed.  A MOST form was introduced.  Discussed with patient/family the importance of continued conversation with family and the medical providers regarding overall plan of care and treatment options, ensuring decisions are within the context of the patient's values and GOCs.    Hospice and Palliative Care services outpatient were explained and offered. Hard choices book was provided.  Questions and concerns were addressed. The family was encouraged to call with questions or concerns.      SUMMARY  OF RECOMMENDATIONS -Family wishes to continue current interventions, they continue to be hopeful for some improvement in her mental status with ongoing thiamine replacement -Her husband is leaning towards DNR but he wishes to discuss this with the family -They are unsure if they would want her to discharge to a nursing facility again-they are going to discuss options among the family which include continuing aggressive care with her returning to a nursing facility, or potentially taking her home with hospice support they are not ready to make any decisions at this time -PMT will follow-up tomorrow  Code Status/Advance Care Planning: Full code   Prognosis:   Unable to determine  Discharge Planning: To Be Determined  Primary Diagnoses: Present on Admission:  Hypernatremia  HTN (hypertension)  HLD (hyperlipidemia)  B12 deficiency   Review of Systems  Physical Exam  Vital Signs: BP 136/85   Pulse 93   Temp 98.4 F (36.9 C) (Oral)   Resp 20   Ht 4\' 9"  (1.448 m)   SpO2 98%   BMI 29.34 kg/m  Pain Scale: 0-10   Pain Score: 0-No pain   SpO2: SpO2: 98 % O2 Device:SpO2: 98 % O2 Flow Rate: .   IO: Intake/output summary:  Intake/Output Summary (Last 24 hours) at 01/26/2022 1155 Last data filed at 01/26/2022 0539 Gross per 24 hour  Intake --  Output 650 ml  Net -650 ml    LBM: Last BM Date : 01/23/22 Baseline Weight:   Most recent weight:         Thank you for this consult. Palliative medicine will continue to follow and assist as needed.  Time Total: 120 minutes Greater than 50%  of this time was spent counseling and coordinating care related to the above assessment and plan.  Signed by: 03/25/22, AGNP-C Palliative Medicine    Please contact Palliative Medicine Team phone at [phone] for questions and concerns.  For individual provider: See [phone]

## 2022-01-26 NOTE — Progress Notes (Signed)
01/26/2022 5:37 PM  Updated family members (husband and children) and answered questions to their satisfaction.   Standley Dakins MD  How to contact the [facility] Attending or Consulting provider 7A - 7P or covering provider during after hours 7P -7A, for this patient?  Check the care team in [facility] and look for a) attending/consulting TRH provider listed and b) the [facility] team listed Log into [email or web address] and use [facility]'s universal password to access. If you do not have the password, please contact the hospital operator. Locate the [facility] provider you are looking for under [facility] and page to a number that you can be directly reached. If you still have difficulty reaching the provider, please page the [facility] (Director on Call) for the Hospitalists listed on amion for assistance.

## 2022-01-26 NOTE — Progress Notes (Signed)
PROGRESS NOTE   Maria Dunlap  [id number] DOB: 07/09/1950 DOA: 01/22/2022 PCP: Patient, No Pcp Per   Chief Complaint  Patient presents with   Altered Mental Status   Level of care: Telemetry  Brief Admission History:  71 y.o. female, past medical history of hypertension, hyperlipidemia, alcohol abuse, patient with recent hospitalization at [facility] where she remained there for 9 days, hospitalization was related to alcohol withdrawal, hyponatremia of 112, Aerococcus UTI where she was treated with 5 days of IV vancomycin, patient remained encephalopathic/confused upon discharge from [facility], remain the same at the facility, he was brought to [facility], ED today given continued confusion, poor oral intake and drinking, patient is minimally interactive unable to provide any complaints, as discussed with the husband/daughter at bedside, patient with history of heavy alcohol abuse, but she was more interactive and appropriate before presented to [facility], she was driving until 5 months ago. - in ED she appears altered, not able to answer any questions appropriately, family report this is no significant change from her new mental baseline since she was discharged from [facility], work-up significant for sodium of 151, potassium of 2.5, hemoglobin of 8.8, white blood cell count of 10.8, CT head with no acute findings, UA remains pending, [facility] hospitalist consulted to admit.   Assessment and Plan: Hypernatremia - treated and resolved - add free water with improvement - D5W infusion ordered, reduced rate - encourage oral free water  - pt refusing to eat or drink, feeding assist ordered - palliative care consultation requested  Dysphagia -Husband reports history of dysphagia -SLP evaluation was requested  -Dys 3 diet ordered by SLP  Dehydration/poor oral intake - husband reports that symptoms have persisted for several weeks - with failure to thrive will  request a palliative medicine consultation - IV fluid for now, encourage eating, assist with feeding order placed  Metabolic encephalopathy - dementia metabolic work up in progress: normal [id number] and folic acid level - MRI and CT brain, no acute findings - corrected sodium level noted above - Adult EEG requested and shows diffuse encephalopathy but no seizure activity - continue high dose IV thiamine given history of alcohol abuse - follow up TSH: 2.159 - neurology consult requested, if patient does not improve over next day or so patient would need to transfer to [facility] for a formal in-person neurology consultation due to limitations of telemedicine consultation  Hypokalemia - repleted, follow K and Mg  Essential hypertension - resumed home amlodipine  - continue to follow  Anemia of chronic illness - serum iron and TIBC reassuring - hemoccult stools x 3 - follow CBC, transfuse for Hg<7  Prolonged QTc - likely exacerbated by electrolyte derangements  History of B12 deficiency - recent B12 level confirms this has been repleted  GERD - continue PPI for GI protection   Hyperlipidemia - resumed home zetia and fish oil   DVT prophylaxis: heparin Code Status:  Full  Family Communication: husband at bedside updated  Disposition: Status is: Inpatient Remains inpatient appropriate because: IV fluids required    Consultants:  Palliative medicine neurology Procedures:   Antimicrobials:    Subjective: Pt still not eating well, not speaking very much, not drinking very much, remains encephalopathic.     Objective: Vitals:   01/25/22 1434 01/25/22 2106 01/26/22 0522 01/26/22 1007  BP: 133/81 117/80 106/77 136/85  Pulse: 87 97 93   Resp: 20     Temp: 98.6 F (37 C) 99.2 F (37.3  C) 98.4 F (36.9 C)   TempSrc: Oral Oral Oral   SpO2: 100% 98% 98%   Height:        Intake/Output Summary (Last 24 hours) at 02/20/2022 1108 Last data filed at 02-20-2022 0539 Gross per 24 hour   Intake --  Output 650 ml  Net -650 ml   There were no vitals filed for this visit. Examination:  General exam: Pt is hard of hearing,  Appears calm and comfortable  Respiratory system: Clear to auscultation. Respiratory effort normal. Cardiovascular system: normal S1 & S2 heard. No JVD, murmurs, rubs, gallops or clicks. No pedal edema. Gastrointestinal system: Abdomen is nondistended, soft and nontender. No organomegaly or masses felt. Normal bowel sounds heard. Central nervous system: Alert and awake. No focal neurological deficits. Extremities: left UE IV infiltration, Symmetric 5 x 5 power. Skin: No rashes, lesions or ulcers. Psychiatry: Judgement and insight UTD. Mood & affect UTD.   Data Reviewed: I have personally reviewed following labs and imaging studies  CBC: Recent Labs  Lab 01/22/22 1549 01/23/22 0259 01/24/22 0455 Feb 20, 2022 0339  WBC 10.8* 9.8 9.6 8.1  NEUTROABS 8.4*  --   --   --   HGB 8.8* 7.4* 7.3* 7.6*  HCT 27.1* 23.4* 23.3* 23.5*  MCV 101.1* 101.7* 103.1* 97.5  PLT 174 148* 164 191    Basic Metabolic Panel: Recent Labs  Lab 01/22/22 1549 01/22/22 1718 01/22/22 1950 01/23/22 0259 01/24/22 0455 01/25/22 0423  NA 151*  --  149* 152* 143 135  K 2.5*  --  2.8* 4.1 3.7 3.3*  CL 124*  --  124* 124* 120* 111  CO2 19*  --  18* 21* 18* 20*  GLUCOSE 100*  --  98 91 124* 114*  BUN 21  --  20 20 16 12   CREATININE 0.95  --  0.99 0.85 0.82 0.58  CALCIUM 8.9  --  8.4* 8.5* 8.2* 8.2*  MG  --  2.1  --   --  2.0 1.7    CBG: No results for input(s): "GLUCAP" in the last 168 hours.  Recent Results (from the past 240 hour(s))  Urine Culture     Status: Abnormal   Collection Time: 01/22/22  7:00 PM   Specimen: In/Out Cath Urine  Result Value Ref Range Status   Specimen Description   Final    IN/OUT CATH URINE Performed at [facility], [address], [place], [place]    Special Requests   Final    NONE Performed at [facility], [address], [place], [place]    Culture >=100,000 COLONIES/mL YEAST (A)  Final   Report Status 01/24/2022 FINAL  Final     Radiology Studies: EEG adult  Result Date: 02-20-22 03/28/2022, MD     Feb 20, 2022  8:45 AM Patient Name: Maria Dunlap MRN: [name] Epile[facility] Attending: [id number] Referring Physician/Provider: Charlsie Quest, MD Date: 01/25/2022 Duration: 24.15 mins Patient history: 71 year old female with altered mental status.  EEG to evaluate for seizure. Level of alertness: Awake AEDs during EEG study: None Technical aspects: This EEG study was done with scalp electrodes positioned according to the 10-20 International system of electrode placement. Electrical activity was reviewed with band pass filter of 1-70Hz , sensitivity of 7 uV/mm, display speed of 17mm/sec with a 60Hz  notched filter applied as appropriate. EEG data were recorded continuously and digitally stored.  Video monitoring was available and reviewed as appropriate. Description: No clear posterior dominant rhythm  was seen.  EEG showed continuous generalized 3 to 6 Hz theta-delta slowing.  Hyperventilation and photic stimulation were not performed.   ABNORMALITY - Continuous slow, generalized IMPRESSION: This study is suggestive of moderate diffuse encephalopathy, nonspecific etiology. No seizures or epileptiform discharges were seen throughout the recording. Priyanka Annabelle Harman    Scheduled Meds:  amLODipine  5 mg Oral Daily   enoxaparin (LOVENOX) injection  40 mg Subcutaneous Q24H   ezetimibe  10 mg Oral Daily   feeding supplement  237 mL Oral BID BM   pantoprazole  40 mg Oral QHS   cyanocobalamin  500 mcg Oral Daily   Continuous Infusions:  dextrose 10 mL/hr at 01/25/22 1020   thiamine (VITAMIN B1) injection 500 mg (01/25/22 1840)    LOS: 4 days   Time spent: 35 mins  Leighla Chestnutt Laural Benes, MD How to contact the [facility] Attending or Consulting provider 7A - 7P or covering provider during after hours 7P -7A,  for this patient?  Check the care team in [facility] and look for a) attending/consulting TRH provider listed and b) the [facility] team listed Log into [email or web address] and use [place]'s universal password to access. If you do not have the password, please contact the hospital operator. Locate the [facility] provider you are looking for under [facility] and page to a number that you can be directly reached. If you still have difficulty reaching the provider, please page the [facility] (Director on Call) for the Hospitalists listed on amion for assistance.  01/26/2022, 11:08 AM

## 2022-01-27 DIAGNOSIS — Z7189 Other specified counseling: Secondary | ICD-10-CM | POA: Diagnosis not present

## 2022-01-27 DIAGNOSIS — Z515 Encounter for palliative care: Secondary | ICD-10-CM | POA: Diagnosis not present

## 2022-01-27 DIAGNOSIS — E538 Deficiency of other specified B group vitamins: Secondary | ICD-10-CM | POA: Diagnosis not present

## 2022-01-27 DIAGNOSIS — E876 Hypokalemia: Secondary | ICD-10-CM | POA: Diagnosis not present

## 2022-01-27 DIAGNOSIS — R4182 Altered mental status, unspecified: Secondary | ICD-10-CM | POA: Diagnosis not present

## 2022-01-27 DIAGNOSIS — E870 Hyperosmolality and hypernatremia: Secondary | ICD-10-CM | POA: Diagnosis not present

## 2022-01-27 LAB — BASIC METABOLIC PANEL
Anion gap: 4 — ABNORMAL LOW (ref 5–15)
BUN: 13 mg/dL (ref 8–23)
CO2: 22 mmol/L (ref 22–32)
Calcium: 8.1 mg/dL — ABNORMAL LOW (ref 8.9–10.3)
Chloride: 109 mmol/L (ref 98–111)
Creatinine, Ser: 0.66 mg/dL (ref 0.44–1.00)
GFR, Estimated: >60 mL/min (ref >60)
Glucose, Bld: 98 mg/dL (ref 70–99)
Potassium: 2.8 mmol/L — ABNORMAL LOW (ref 3.5–5.1)
Sodium: 135 mmol/L (ref 135–145)

## 2022-01-27 LAB — BLOOD GAS, VENOUS
Acid-base deficit: 0.1 mmol/L (ref 0.0–2.0)
Bicarbonate: 23.6 mmol/L (ref 20.0–28.0)
Drawn by: 6892
O2 Saturation: 79.5 %
Patient temperature: 36.5
pCO2, Ven: 33 mmHg — ABNORMAL LOW (ref 44–60)
pH, Ven: 7.46 — ABNORMAL HIGH (ref 7.25–7.43)
pO2, Ven: 44 mmHg (ref 32–45)

## 2022-01-27 LAB — CBC
HCT: 22.6 % — ABNORMAL LOW (ref 36.0–46.0)
Hemoglobin: 7.6 g/dL — ABNORMAL LOW (ref 12.0–15.0)
MCH: 33 pg (ref 26.0–34.0)
MCHC: 33.6 g/dL (ref 30.0–36.0)
MCV: 98.3 fL (ref 80.0–100.0)
Platelets: 201 10*3/uL (ref 150–400)
RBC: 2.3 MIL/uL — ABNORMAL LOW (ref 3.87–5.11)
RDW: 18.9 % — ABNORMAL HIGH (ref 11.5–15.5)
WBC: 7.5 10*3/uL (ref 4.0–10.5)
nRBC: 0 % (ref 0.0–0.2)

## 2022-01-27 LAB — MAGNESIUM: Magnesium: 1.7 mg/dL (ref 1.7–2.4)

## 2022-01-27 MED ORDER — THIAMINE MONONITRATE 100 MG PO TABS
100.0000 mg | ORAL_TABLET | Freq: Every day | ORAL | Status: DC
  Administered 2022-01-28 – 2022-01-29 (×2): 100 mg via ORAL
  Filled 2022-01-27 (×2): qty 1

## 2022-01-27 MED ORDER — POTASSIUM CHLORIDE CRYS ER 20 MEQ PO TBCR
40.0000 meq | EXTENDED_RELEASE_TABLET | Freq: Once | ORAL | Status: AC
  Administered 2022-01-27: 40 meq via ORAL
  Filled 2022-01-27: qty 2

## 2022-01-27 MED ORDER — POTASSIUM CHLORIDE 10 MEQ/100ML IV SOLN
10.0000 meq | INTRAVENOUS | Status: AC
  Administered 2022-01-27 (×3): 10 meq via INTRAVENOUS
  Filled 2022-01-27 (×3): qty 100

## 2022-01-27 MED ORDER — MAGNESIUM SULFATE 2 GM/50ML IV SOLN
2.0000 g | Freq: Once | INTRAVENOUS | Status: AC
  Administered 2022-01-27: 2 g via INTRAVENOUS
  Filled 2022-01-27: qty 50

## 2022-01-27 NOTE — Progress Notes (Signed)
Occupational Therapy Treatment Patient Details Name: Maria Dunlap MRN: [id number] DOB: 03-03-1951 Today's Date: 01/27/2022   History of present illness Maria Dunlap  is a 71 y.o. female, past medical history of hypertension, hyperlipidemia, alcohol abuse, patient with recent hospitalization at [facility] where she remained there for 9 days, hospitalization was related to alcohol withdrawal, hyponatremia of 112, Aerococcus UTI where she was treated with 5 days of IV vancomycin, patient remained encephalopathic/confused upon discharge from [facility], remain the same at the facility, he was brought to [facility], ED today given continued confusion, poor oral intake and drinking, patient is minimally interactive unable to provide any complaints, as discussed with the husband/daughter at bedside, patient with history of heavy alcohol abuse, but she was more interactive and appropriate before presented to [facility], she was driving until 5 months ago.  - in ED she appears altered, not able to answer any questions appropriately, family report this is no significant change from her new mental baseline since she was discharged from [facility], work-up significant for sodium of 151, potassium of 2.5, hemoglobin of 8.8, white blood cell count of 10.8, CT head with no acute findings, UA remains pending, [facility] hospitalist consulted to admit.   OT comments  Pt agreeable to OT and PT co-treatment today. Pt demonstrated improved arousal level with intermittent attempts at conversation with therapists. Pt was able to actively pull to sit with assist at EOB. Pt required less assist to stand at a more moderate level today. Pt able to stand pivot transfer to chair with max A without RW. Pt still struggles with weight shift and marching in place. Pt was left in chair with call bell within reach, chair alarm on, and family present. Pt will benefit from continued OT in the hospital and recommended  venue below to increase strength, balance, and endurance for safe ADL's.      Recommendations for follow up therapy are one component of a multi-disciplinary discharge planning process, led by the attending physician.  Recommendations may be updated based on patient status, additional functional criteria and insurance authorization.    Follow Up Recommendations  Skilled nursing-short term rehab (<3 hours/day)    Assistance Recommended at Discharge Frequent or constant Supervision/Assistance  Patient can return home with the following  A lot of help with walking and/or transfers;A lot of help with bathing/dressing/bathroom;Assistance with cooking/housework;Assistance with feeding;Assist for transportation;Help with stairs or ramp for entrance;Direct supervision/assist for medications management   Equipment Recommendations  None recommended by OT    Recommendations for Other Services      Precautions / Restrictions Precautions Precautions: Fall Restrictions Weight Bearing Restrictions: No       Mobility Bed Mobility Overal bed mobility: Needs Assistance Bed Mobility: Sit to Supine     Supine to sit: Min assist, Mod assist, HOB elevated     General bed mobility comments: Able to pull to sit with min to mod A with HOB elevated.    Transfers Overall transfer level: Needs assistance Equipment used: Rolling walker (2 wheels) Transfers: Sit to/from Stand, Bed to chair/wheelchair/BSC Sit to Stand: Mod assist Stand pivot transfers: Max assist         General transfer comment: Pt able to stand with moderate assistance using RW and gait belt. Pt struggles to weight shift and take lateral steps. Max A needed to pivot to chair from standing position without RW. ~3 to 4 reps of sit to stand completed today.     Balance Overall  balance assessment: Needs assistance Sitting-balance support: Bilateral upper extremity supported, Feet supported Sitting balance-Leahy Scale:  Poor Sitting balance - Comments: poor progressing to fair with pt sitting without support   Standing balance support: Bilateral upper extremity supported, During functional activity, Reliant on assistive device for balance Standing balance-Leahy Scale: Poor Standing balance comment: poor to fair standing with RW; mild R side lean today.                            Cognition Arousal/Alertness: Awake/alert Behavior During Therapy: WFL for tasks assessed/performed Overall Cognitive Status: Impaired/Different from baseline                                 General Comments: Pt able to follow one step commands today and engage in conversation intermittently.                           Pertinent Vitals/ Pain       Pain Assessment Pain Assessment: Faces Faces Pain Scale: No hurt                                                          Frequency  Min 2X/week        Progress Toward Goals  OT Goals(current goals can now be found in the care plan section)  Progress towards OT goals: Progressing toward goals  Acute Rehab OT Goals Patient Stated Goal: open to return to rehab OT Goal Formulation: With patient Time For Goal Achievement: 02/08/22 Potential to Achieve Goals: Fair ADL Goals Pt Will Perform Eating: with modified independence;sitting Pt Will Perform Grooming: with min assist;sitting Pt Will Perform Upper Body Bathing: with min assist;sitting Pt Will Perform Upper Body Dressing: with min assist;sitting Pt Will Perform Lower Body Dressing: with mod assist;sitting/lateral leans;bed level Pt Will Transfer to Toilet: with mod assist;stand pivot transfer Pt/caregiver will Perform Home Exercise Program: Increased ROM;Increased strength;Both right and left upper extremity;With minimal assist  Plan Discharge plan remains appropriate    Co-evaluation    PT/OT/SLP Co-Evaluation/Treatment: Yes Reason for Co-Treatment: To  address functional/ADL transfers   OT goals addressed during session: ADL's and self-care                          End of Session Equipment Utilized During Treatment: Gait belt;Rolling walker (2 wheels)  OT Visit Diagnosis: Unsteadiness on feet (R26.81);Other abnormalities of gait and mobility (R26.89);Muscle weakness (generalized) (M62.81);Cognitive communication deficit (R41.841)   Activity Tolerance Patient tolerated treatment well   Patient Left in chair;with chair alarm set;with call bell/phone within reach;with family/visitor present   Nurse Communication Mobility status        Time: 0822-0849 OT Time Calculation (min): 27 min  Charges: OT General Charges $OT Visit: 1 Visit OT Treatments $Self Care/Home Management : 8-22 mins  Miquel Stacks OT, MOT  Danie Chandler 01/27/2022, 9:09 AM

## 2022-01-27 NOTE — Progress Notes (Signed)
Speech Language Pathology Treatment: Dysphagia  Patient Details Name: Maria Dunlap MRN: [id number] DOB: June 29, 1950 Today's Date: 01/27/2022 Time: 4709-6283 SLP Time Calculation (min) (ACUTE ONLY): 24 min  Assessment / Plan / Recommendation Clinical Impression  Ongoing diagnostic dysphagia therapy provided today targeting limited trials; Pt pleasantly refused regular trials provided and reports "I won't take it if I don't want it". Pt did accept 3-4 tsp bites of a milkshake SLP made and then stated "I'm telling you I don't want any more of that water". No overt s/sx of aspiration were noted with the limited liquid trials and limited ice cream bites presented. Daughter present for treatment reports Pt just ate a few bites of her lunch prior to SLP arrival, without incident. Echo BSE findings that Pt presents with a cognitive based dysphagia (poor coordination and reduced attention to task) and based on today's presentation just very little interest in PO intake at all. Pt's daughter again communicated concern about her Mother's "flapper not working", however she elaborated that no objective test was completed to confirm this; SLP encouraged her that there were no overt s/sx at bedside to indicate the need for an instrumental assessment at this time. SLP reviewed universal aspiration precautions with Pt's daughter and encouraged her to provide foods that Pt likes. Reinforced the importance of oral care. Recommend continue with D3/thin diet. ST will continue to follow.    HPI HPI: 71 y.o. female, past medical history of hypertension, hyperlipidemia, alcohol abuse, patient with recent hospitalization at [facility] where she remained there for 9 days, hospitalization was related to alcohol withdrawal, hyponatremia of 112, Aerococcus UTI where she was treated with 5 days of IV vancomycin, patient remained encephalopathic/confused upon discharge from [facility], remain the same at the facility, he was  brought to [facility], ED today given continued confusion, poor oral intake and drinking, patient is minimally interactive unable to provide any complaints, as discussed with the husband/daughter at bedside, patient with history of heavy alcohol abuse, but she was more interactive and appropriate before presented to [facility], she was driving until 5 months ago.  - in ED she appears altered, not able to answer any questions appropriately, family report this is no significant change from her new mental baseline since she was discharged from [facility], work-up significant for sodium of 151, potassium of 2.5, hemoglobin of 8.8, white blood cell count of 10.8, CT head with no acute findings, UA remains pending. BSE requested.      SLP Plan  All goals met      Recommendations for follow up therapy are one component of a multi-disciplinary discharge planning process, led by the attending physician.  Recommendations may be updated based on patient status, additional functional criteria and insurance authorization.    Recommendations  Diet recommendations: Dysphagia 3 (mechanical soft);Thin liquid Liquids provided via: Cup;Straw Medication Administration: Whole meds with liquid Supervision: Staff to assist with self feeding;Full supervision/cueing for compensatory strategies Compensations: Slow rate;Follow solids with liquid                Oral Care Recommendations: Oral care BID;Staff/trained caregiver to provide oral care Follow Up Recommendations: Follow physician's recommendations for discharge plan and follow up therapies Assistance recommended at discharge: Intermittent Supervision/Assistance SLP Visit Diagnosis: Dysphagia, unspecified (R13.10) Plan: All goals met          Renuka Farfan H. Roddie Mc, CCC-SLP Speech Language Pathologist  Wende Bushy  01/27/2022, 1:17 PM

## 2022-01-27 NOTE — Progress Notes (Signed)
Physical Therapy Treatment Patient Details Name: Maria Dunlap MRN: [id number] DOB: February 05, 1951 Today's Date: 01/27/2022   History of Present Illness Maria Dunlap  is a 71 y.o. female, past medical history of hypertension, hyperlipidemia, alcohol abuse, patient with recent hospitalization at [facility] where she remained there for 9 days, hospitalization was related to alcohol withdrawal, hyponatremia of 112, Aerococcus UTI where she was treated with 5 days of IV vancomycin, patient remained encephalopathic/confused upon discharge from [facility], remain the same at the facility, he was brought to [facility], ED today given continued confusion, poor oral intake and drinking, patient is minimally interactive unable to provide any complaints, as discussed with the husband/daughter at bedside, patient with history of heavy alcohol abuse, but she was more interactive and appropriate before presented to [facility], she was driving until 5 months ago.  - in ED she appears altered, not able to answer any questions appropriately, family report this is no significant change from her new mental baseline since she was discharged from [facility], work-up significant for sodium of 151, potassium of 2.5, hemoglobin of 8.8, white blood cell count of 10.8, CT head with no acute findings, UA remains pending, [facility] hospitalist consulted to admit.    PT Comments    Patient lying in bed on therapist arrival. Her husband is present at bedside.  Supine to sit with HOB elevated with max assist.  Once sitting on EOB; she needs mod A to balance initially but when her feet are supported, she improves to CGA/ SBA for sitting balance.  Patient tends to lean to the right and posteriorly but improves with cues.  Sit to stand to RW today with mod A.  Patient fatigues quickly and needs a seated rest.  Bed to chair with step pivot transfer max A today.  Patient left in chair with chair alarm on with husband  present; nursing notified of mobility status today.  Patient will benefit from continued skilled therapy services during the remainder of her hospital stay and at the next recommended venue of care to address deficits and promote return to optimal function.       Recommendations for follow up therapy are one component of a multi-disciplinary discharge planning process, led by the attending physician.  Recommendations may be updated based on patient status, additional functional criteria and insurance authorization.  Follow Up Recommendations  Skilled nursing-short term rehab (<3 hours/day) Can patient physically be transported by private vehicle: No   Assistance Recommended at Discharge Frequent or constant Supervision/Assistance  Patient can return home with the following A lot of help with walking and/or transfers;A lot of help with bathing/dressing/bathroom;Help with stairs or ramp for entrance   Equipment Recommendations  None recommended by PT    Recommendations for Other Services       Precautions / Restrictions Precautions Precautions: Fall Restrictions Weight Bearing Restrictions: No     Mobility  Bed Mobility Overal bed mobility: Needs Assistance Bed Mobility: Sit to Supine     Supine to sit: Min assist, Mod assist, HOB elevated Sit to supine: Max assist, HOB elevated   General bed mobility comments: Able to pull to sit with min to mod A with HOB elevated. Patient Response: Flat affect, Cooperative  Transfers Overall transfer level: Needs assistance Equipment used: Rolling walker (2 wheels) Transfers: Sit to/from Stand, Bed to chair/wheelchair/BSC Sit to Stand: Mod assist Stand pivot transfers: Max assist         General transfer comment: Pt able to  stand with moderate assistance using RW and gait belt. Pt struggles to weight shift and take lateral steps. Max A needed to pivot to chair from standing position without RW. ~3 to 4 reps of sit to stand completed  today.    Ambulation/Gait                   Stairs             Wheelchair Mobility    Modified Rankin (Stroke Patients Only)       Balance Overall balance assessment: Needs assistance Sitting-balance support: Bilateral upper extremity supported, Feet supported Sitting balance-Leahy Scale: Poor Sitting balance - Comments: poor progressing to fair with pt sitting without support   Standing balance support: Bilateral upper extremity supported, During functional activity, Reliant on assistive device for balance Standing balance-Leahy Scale: Poor Standing balance comment: poor to fair standing with RW; mild R side lean today.                            Cognition Arousal/Alertness: Awake/alert Behavior During Therapy: WFL for tasks assessed/performed Overall Cognitive Status: Impaired/Different from baseline Area of Impairment: Following commands, Safety/judgement, Awareness                             Problem Solving: Slow processing, Difficulty sequencing, Requires verbal cues, Requires tactile cues, Decreased initiation General Comments: Pt able to follow one step commands today and engage in conversation intermittently.        Exercises      General Comments        Pertinent Vitals/Pain Pain Assessment Pain Assessment: No/denies pain    Home Living                          Prior Function            PT Goals (current goals can now be found in the care plan section) Acute Rehab PT Goals Patient Stated Goal: return home PT Goal Formulation: With patient/family Time For Goal Achievement: 02/08/22 Potential to Achieve Goals: Good Progress towards PT goals: Progressing toward goals    Frequency    Min 2X/week      PT Plan      Co-evaluation PT/OT/SLP Co-Evaluation/Treatment: Yes Reason for Co-Treatment: For patient/therapist safety;To address functional/ADL transfers PT goals addressed during session:  Mobility/safety with mobility;Balance OT goals addressed during session: ADL's and self-care      AM-PAC PT "6 Clicks" Mobility   Outcome Measure  Help needed turning from your back to your side while in a flat bed without using bedrails?: A Lot Help needed moving from lying on your back to sitting on the side of a flat bed without using bedrails?: A Lot Help needed moving to and from a bed to a chair (including a wheelchair)?: A Lot Help needed standing up from a chair using your arms (e.g., wheelchair or bedside chair)?: A Lot Help needed to walk in hospital room?: A Lot Help needed climbing 3-5 steps with a railing? : Total 6 Click Score: 11    End of Session   Activity Tolerance: Patient tolerated treatment well Patient left: in bed;with family/visitor present;with call bell/phone within reach;with bed alarm set Nurse Communication: Mobility status PT Visit Diagnosis: Unsteadiness on feet (R26.81);Other abnormalities of gait and mobility (R26.89);Muscle weakness (generalized) (M62.81)     Time: 0350-0938 PT  Time Calculation (min) (ACUTE ONLY): 35 min  Charges:  $Therapeutic Activity: 8-22 mins                     9:19 AM, 01/27/22 Thadd Apuzzo Small Zanasia Hickson MPT [facility] [place] [phone] [phone]

## 2022-01-27 NOTE — Progress Notes (Addendum)
Daily Progress Note   Patient Name: Maria Dunlap       Date: 01/27/2022 DOB: 1951/03/19  Age: 71 y.o. MRN#: [id number] Attending Physician: Catarina Hartshorn, MD Primary Care Physician: Patient, No Pcp Per Admit Date: 01/22/2022  Reason for Consultation/Follow-up: Establishing goals of care  Patient Profile/HPI:   71 y.o. female  with past medical history of HTN, HLD, ETOH, Vit b12 deficiency, admitted on 01/22/2022 with confusion. She had recent hospitalization at [facility] for hyponatremia and UTI.  She was discharged to [facility] rehab.  She has continued to have poor p.o. intake and confusion.  Work-up this admission shows hypernatremia.  She had an EEG negative for seizures and indicated diffuse moderate encephalopathy of unknown etiology.  MRI showed mildly advanced cerebral atrophy.  She has electrolyte imbalances today she is hypocalcemic and hypokalemic.  Her sodium has been corrected.  Neurology has been consulted with recommendations for likely encephalopathy due to significant alcohol use and nutritional depletion.  She is undergoing IV thiamine replacement.  Palliative medicine consulted for goals of care.  Subjective: Chart reviewed including labs and progress notes. Patient is sitting up in bed awake and alert she is not oriented. She continues not to eat or drink. Her husband and her son are at bedside. Family has completed the MOST form and he has it at the bedside.  Selections made as follows: Attempt resuscitation Limited additional interventions Antibiotics if indicated IV fluids if indicated No feeding tube  Their current goal is to continue medical interventions and hope for patient to discharge to a nursing facility.  Outpatient palliative services were offered and patient's  spouse declined.  Review of Systems  Unable to perform ROS: Mental status change     Physical Exam Vitals and nursing note reviewed.  Constitutional:      Comments: Frail  Cardiovascular:     Rate and Rhythm: Normal rate.  Pulmonary:     Effort: Pulmonary effort is normal.  Neurological:     Mental Status: She is alert. She is disoriented.             Vital Signs: BP (!) 122/90 (BP Location: Right Arm)   Pulse 94   Temp 97.7 F (36.5 C) (Oral)   Resp 19   Ht 4\' 9"  (1.448 m)   SpO2 100%  BMI 29.34 kg/m  SpO2: SpO2: 100 % O2 Device: O2 Device: Room Air O2 Flow Rate:    Intake/output summary:  Intake/Output Summary (Last 24 hours) at 01/27/2022 1646 Last data filed at 01/27/2022 1526 Gross per 24 hour  Intake 1993.49 ml  Output 400 ml  Net 1593.49 ml   LBM: Last BM Date : 01/23/22 Baseline Weight:   Most recent weight:         Palliative Assessment/Data: PPS: 20%      Patient Active Problem List   Diagnosis Date Noted   Hypernatremia 01/22/2022   AMS (altered mental status) 01/22/2022   Menopause    Symptoms, such as flushing, sleeplessness, headache, lack of concentration, associated with the menopause 01/08/2013   Routine gynecological examination 11/27/2012   HTN (hypertension) 10/23/2012   HLD (hyperlipidemia) 10/23/2012   B12 deficiency 10/23/2012   Post-menopause on HRT (hormone replacement therapy) 10/23/2012    Palliative Care Assessment & Plan    Assessment/Recommendations/Plan  See MOST form selections above-MOST form was signed and scanned to [facility]-original copy placed on patient's chart Family anticipates discharge to SNF   Code Status: Full code  Prognosis:  Unable to determine  Discharge Planning: To Be Determined  Care plan was discussed with patient's family  Thank you for allowing the Palliative Medicine Team to assist in the care of this patient.   Greater than 50%  of this time was spent counseling and coordinating  care related to the above assessment and plan.  Ocie Bob, AGNP-C Palliative Medicine   Please contact Palliative Medicine Team phone at [phone] for questions and concerns.

## 2022-01-27 NOTE — Progress Notes (Signed)
Pt currently has no IV access due to pt being a hard stick. Multiple attempts have been made to find someone available to try ultrasound with no success. MD notified.

## 2022-01-27 NOTE — Progress Notes (Signed)
PROGRESS NOTE  Maria Dunlap XTK:[id number] DOB: 02-Jul-1950 DOA: 01/22/2022 PCP: Patient, No Pcp Per  Brief History:  71 y.o. female, past medical history of hypertension, hyperlipidemia, alcohol abuse, patient with recent hospitalization at [facility] where she remained there for 9 days, hospitalization was related to alcohol withdrawal, hyponatremia of 112, Aerococcus UTI where she was treated with 5 days of IV vancomycin, patient remained encephalopathic/confused upon discharge from [facility], remain the same at the facility, he was brought to [facility], ED today given continued confusion, poor oral intake and drinking, patient is minimally interactive unable to provide any complaints, as discussed with the husband/daughter at bedside, patient with history of heavy alcohol abuse, but she was more interactive and appropriate before presented to [facility], she was driving until 5 months ago. - in ED she appears altered, not able to answer any questions appropriately, family report this is no significant change from her new mental baseline since she was discharged from [facility], work-up significant for sodium of 151, potassium of 2.5, hemoglobin of 8.8, white blood cell count of 10.8, CT head with no acute findings, UA remains pending, [facility] hospitalist consulted to admit.     Assessment and Plan: Hypernatremia - treated and resolved - add free water with improvement - D5W infusion ordered, reduced rate - encourage oral free water  - pt refusing to eat or drink, feeding assist ordered - palliative care consultation requested   Dysphagia -Husband reports history of dysphagia -SLP evaluation was requested  -Dys 3 diet ordered by SLP   Failure to Thrive - husband reports that symptoms have persisted for several weeks - with failure to thrive will request a palliative medicine consultation - IV fluid for now, encourage eating, assist with feeding  order placed - family give hx of functional and cognitive decline over last 6 months   Acute Metabolic encephalopathy - dementia metabolic work up in progress: normal B12 and folic acid level - MRI and CT brain, no acute findings - corrected sodium level noted above - Adult EEG requested and shows diffuse encephalopathy but no seizure activity - continue high dose IV thiamine given history of alcohol abuse--received 6 doses with minimal improvement - follow up TSH: 2.159 - VBG  7.46/33/44/23 on RA - neurology consult requested, if patient does not improve over next day or so patient would need to transfer to [facility] for a formal in-person neurology consultation due to limitations of telemedicine consultation   Hypokalemia - repleted, follow K and Mg   Essential hypertension - resumed home amlodipine  - continue to follow   Anemia of chronic illness - iron saturation 24 - hemoccult stools x 3 - follow CBC, transfuse for Hg<7   Prolonged QTc - likely exacerbated by electrolyte derangements   History of B12 deficiency - recent B12 level confirms this has been repleted   GERD - continue PPI for GI protection    Hyperlipidemia - resumed home zetia and fish oil     Family Communication:   daughter updated at bedside 9/6  Consultants:  palliative  Code Status:  FULL   DVT Prophylaxis:  [place] Lovenox   Procedures: As Listed in Progress Note Above  Antibiotics: None      Subjective: Patient denies fevers, chills, headache, chest pain, dyspnea, nausea, vomiting, diarrhea, abdominal pain, dysuria, hematuria, hematochezia, and melena.   Objective: Vitals:   01/26/22 1336 01/27/22 0602 01/27/22 0904 01/27/22 1413  BP: (!) 146/95 130/85 126/86 (!) 122/90  Pulse: 88 89  94  Resp: 20 16  19   Temp: 98.1 F (36.7 C) 98.6 F (37 C)  97.7 F (36.5 C)  TempSrc: Oral   Oral  SpO2: (!) 88% 99%  100%  Height:        Intake/Output Summary (Last 24 hours) at 01/27/2022  1741 Last data filed at 01/27/2022 1526 Gross per 24 hour  Intake 1993.49 ml  Output 400 ml  Net 1593.49 ml   Weight change:  Exam:  General:  Pt is alert, follows commands intermittently, not in acute distress HEENT: No icterus, No thrush, No neck mass, [place]/AT Cardiovascular: RRR, S1/S2, no rubs, no gallops Respiratory: CTA bilaterally, no wheezing, no crackles, no rhonchi Abdomen: Soft/+BS, non tender, non distended, no guarding Extremities: No edema, No lymphangitis, No petechiae, No rashes, no synovitis   Data Reviewed: I have personally reviewed following labs and imaging studies Basic Metabolic Panel: Recent Labs  Lab 01/22/22 1718 01/22/22 1950 01/23/22 0259 01/24/22 0455 01/25/22 0423 01/27/22 0310  NA  --  149* 152* 143 135 135  K  --  2.8* 4.1 3.7 3.3* 2.8*  CL  --  124* 124* 120* 111 109  CO2  --  18* 21* 18* 20* 22  GLUCOSE  --  98 91 124* 114* 98  BUN  --  20 20 16 12 13   CREATININE  --  0.99 0.85 0.82 0.58 0.66  CALCIUM  --  8.4* 8.5* 8.2* 8.2* 8.1*  MG 2.1  --   --  2.0 1.7 1.7   Liver Function Tests: Recent Labs  Lab 01/22/22 1549  AST 38  ALT 22  ALKPHOS 63  BILITOT 1.7*  PROT 6.2*  ALBUMIN 3.0*   Recent Labs  Lab 01/22/22 1549  LIPASE 41   Recent Labs  Lab 01/22/22 1841  AMMONIA 23   Coagulation Profile: Recent Labs  Lab 01/22/22 1549  INR 1.2   CBC: Recent Labs  Lab 01/22/22 1549 01/23/22 0259 01/24/22 0455 01/26/22 0339 01/27/22 0310  WBC 10.8* 9.8 9.6 8.1 7.5  NEUTROABS 8.4*  --   --   --   --   HGB 8.8* 7.4* 7.3* 7.6* 7.6*  HCT 27.1* 23.4* 23.3* 23.5* 22.6*  MCV 101.1* 101.7* 103.1* 97.5 98.3  PLT 174 148* 164 191 201   Cardiac Enzymes: No results for input(s): "CKTOTAL", "CKMB", "CKMBINDEX", "TROPONINI" in the last 168 hours. BNP: Invalid input(s): "POCBNP" CBG: No results for input(s): "GLUCAP" in the last 168 hours. HbA1C: No results for input(s): "HGBA1C" in the last 72 hours. Urine analysis:     Component Value Date/Time   COLORURINE YELLOW 01/22/2022 1905   APPEARANCEUR CLOUDY (A) 01/22/2022 1905   LABSPEC 1.020 01/22/2022 1905   PHURINE 6.0 01/22/2022 1905   GLUCOSEU NEGATIVE 01/22/2022 1905   HGBUR TRACE (A) 01/22/2022 1905   BILIRUBINUR NEGATIVE 01/22/2022 1905   BILIRUBINUR neg 08/13/2013 1218   KETONESUR NEGATIVE 01/22/2022 1905   PROTEINUR TRACE (A) 01/22/2022 1905   UROBILINOGEN negative 08/13/2013 1218   NITRITE NEGATIVE 01/22/2022 1905   LEUKOCYTESUR NEGATIVE 01/22/2022 1905   Sepsis Labs: @LABRCNTIP (procalcitonin:4,lacticidven:4) ) Recent Results (from the past 240 hour(s))  Urine Culture     Status: Abnormal   Collection Time: 01/22/22  7:00 PM   Specimen: In/Out Cath Urine  Result Value Ref Range Status   Specimen Description   Final    IN/OUT CATH URINE Performed at [facility], [address] [place], [place] [address]    Special Requests   Final    NONE Performed at [facility], [address] [place], [place] [address]    Culture >=100,000 COLONIES/mL YEAST (A)  Final   Report Status 01/24/2022 FINAL  Final     Scheduled Meds:  amLODipine  5 mg Oral Daily   enoxaparin (LOVENOX) injection  40 mg Subcutaneous Q24H   ezetimibe  10 mg Oral Daily   feeding supplement  237 mL Oral BID BM   pantoprazole  40 mg Oral QHS   [START ON 01/28/2022] thiamine  100 mg Oral Daily   cyanocobalamin  500 mcg Oral Daily   Continuous Infusions:  dextrose 10 mL/hr at 01/27/22 1526   potassium chloride 10 mEq (01/27/22 1712)   thiamine (VITAMIN B1) injection Stopped (02-11-2022 1745)    Procedures/Studies: EEG adult  Result Date: Feb 11, 2022 Charlsie Quest, MD     February 11, 2022  8:45 AM Patient Name: Maria Dunlap MRN: [id number] Epilepsy Attending: Charlsie Quest Referring Physician/Provider: Cleora Fleet, MD Date: 01/25/2022 Duration: 24.15 mins Patient history: 71 year old female with altered mental status.  EEG to evaluate for seizure. Level of  alertness: Awake AEDs during EEG study: None Technical aspects: This EEG study was done with scalp electrodes positioned according to the 10-20 International system of electrode placement. Electrical activity was reviewed with band pass filter of 1-70Hz , sensitivity of 7 uV/mm, display speed of 38mm/sec with a 60Hz  notched filter applied as appropriate. EEG data were recorded continuously and digitally stored.  Video monitoring was available and reviewed as appropriate. Description: No clear posterior dominant rhythm was seen.  EEG showed continuous generalized 3 to 6 Hz theta-delta slowing.  Hyperventilation and photic stimulation were not performed.   ABNORMALITY - Continuous slow, generalized IMPRESSION: This study is suggestive of moderate diffuse encephalopathy, nonspecific etiology. No seizures or epileptiform discharges were seen throughout the recording.   MR BRAIN WO CONTRAST  Result Date: 01/22/2022 CLINICAL DATA:  Initial evaluation for altered mental status, delirium. EXAM: MRI HEAD WITHOUT CONTRAST TECHNIQUE: Multiplanar, multiecho pulse sequences of the brain and surrounding structures were obtained without intravenous contrast. COMPARISON:  Prior CT from earlier the same day. FINDINGS: Brain: Diffuse prominence of the CSF containing spaces compatible generalized cerebral atrophy. No significant cerebral white matter disease for age. No evidence for acute or subacute infarct. Gray-white matter differentiation maintained. No areas of chronic cortical infarction. No acute intracranial hemorrhage. Single punctate chronic microhemorrhage noted within the right cerebellum, of doubtful significance in isolation. No mass lesion, midline shift or mass effect. No hydrocephalus or extra-axial fluid collection. Pituitary gland and suprasellar region within normal limits. Vascular: Major intracranial vascular flow voids are maintained. Skull and upper cervical spine: Craniocervical junction  within normal limits. Bone marrow signal intensity normal. No scalp soft tissue abnormality. Sinuses/Orbits: Globes orbital soft tissues demonstrate no acute finding. Paranasal sinuses are largely clear. Trace left mastoid effusion noted, of doubtful significance. Other: None. IMPRESSION: 1. No acute intracranial abnormality. 2. Mildly advanced cerebral atrophy. Otherwise unremarkable brain MRI for age. Electronically Signed   By: 03/24/2022 M.D.   On: 01/22/2022 19:05   CT Head Wo Contrast  Result Date: 01/22/2022 CLINICAL DATA:  Mental status change, unknown cause. EXAM: CT HEAD WITHOUT CONTRAST TECHNIQUE: Contiguous axial images were obtained from the base of the skull through the vertex without intravenous contrast. RADIATION DOSE REDUCTION: This exam was performed according to the departmental dose-optimization program which includes automated  exposure control, adjustment of the mA and/or kV according to patient size and/or use of iterative reconstruction technique. COMPARISON:  Head CT 01/09/2022 FINDINGS: Brain: There is no evidence of an acute infarct, intracranial hemorrhage, mass, midline shift, or extra-axial fluid collection. There is mild cerebral atrophy. Periventricular white matter hypodensities are unchanged and nonspecific but compatible with mild chronic small vessel ischemic disease. Vascular: No hyperdense vessel. Skull: No fracture or suspicious osseous lesion. Sinuses/Orbits: No significant inflammatory disease in the included paranasal sinuses or mastoid air cells. Unremarkable orbits. Other: None. IMPRESSION: 1. No evidence of acute intracranial abnormality. 2. Mild chronic small vessel ischemic disease. Electronically Signed   By: Sebastian Ache M.D.   On: 01/22/2022 16:42   DG Chest 1 View  Result Date: 01/22/2022 CLINICAL DATA:  Chest pain with altered mental status. EXAM: CHEST  1 VIEW COMPARISON:  Radiographs 01/09/2022.  CT 01/09/2022. FINDINGS: 1617 hours. Lordotic  positioning. The heart size and mediastinal contours are normal. The lungs are clear. There is no pleural effusion or pneumothorax. No acute osseous findings are identified. There are degenerative changes at both shoulders. Telemetry leads overlie the chest. IMPRESSION: No evidence of active cardiopulmonary process. Electronically Signed   By: Carey Bullocks M.D.   On: 01/22/2022 16:24    Catarina Hartshorn, DO  [facility]  If 7PM-7AM, please contact night-coverage [email or web address] Password [email or web address] 01/27/2022, 5:41 PM   LOS: 5 days

## 2022-01-27 NOTE — Progress Notes (Signed)
Noted patients IV leaking, charge nurse made aware. Patient has Vitamin B1 ordered IV at 1800. On-coming nurse made aware regarding giving patient medication once patient has IV access.

## 2022-01-28 DIAGNOSIS — E860 Dehydration: Secondary | ICD-10-CM

## 2022-01-28 DIAGNOSIS — E876 Hypokalemia: Secondary | ICD-10-CM | POA: Diagnosis not present

## 2022-01-28 DIAGNOSIS — E538 Deficiency of other specified B group vitamins: Secondary | ICD-10-CM | POA: Diagnosis not present

## 2022-01-28 DIAGNOSIS — E870 Hyperosmolality and hypernatremia: Secondary | ICD-10-CM | POA: Diagnosis not present

## 2022-01-28 LAB — BASIC METABOLIC PANEL
Anion gap: 4 — ABNORMAL LOW (ref 5–15)
BUN: 11 mg/dL (ref 8–23)
CO2: 22 mmol/L (ref 22–32)
Calcium: 8 mg/dL — ABNORMAL LOW (ref 8.9–10.3)
Chloride: 108 mmol/L (ref 98–111)
Creatinine, Ser: 0.56 mg/dL (ref 0.44–1.00)
GFR, Estimated: >60 mL/min (ref >60)
Glucose, Bld: 97 mg/dL (ref 70–99)
Potassium: 3.4 mmol/L — ABNORMAL LOW (ref 3.5–5.1)
Sodium: 134 mmol/L — ABNORMAL LOW (ref 135–145)

## 2022-01-28 LAB — MAGNESIUM: Magnesium: 2.1 mg/dL (ref 1.7–2.4)

## 2022-01-28 MED ORDER — ENSURE ENLIVE PO LIQD: 237.0000 mL | Freq: Two times a day (BID) | ORAL | 12 refills | Status: DC

## 2022-01-28 MED ORDER — MAGNESIUM OXIDE -MG SUPPLEMENT 400 (240 MG) MG PO TABS: 400.0000 mg | ORAL_TABLET | Freq: Every day | ORAL | Status: DC

## 2022-01-28 MED ORDER — VITAMIN B-1 100 MG PO TABS: 100.0000 mg | ORAL_TABLET | Freq: Every day | ORAL | Status: DC

## 2022-01-28 MED ORDER — MAGNESIUM OXIDE -MG SUPPLEMENT 400 (240 MG) MG PO TABS
400.0000 mg | ORAL_TABLET | Freq: Every day | ORAL | Status: DC
Start: 2022-01-28 — End: 2022-01-29
  Administered 2022-01-28 – 2022-01-29 (×2): 400 mg via ORAL
  Filled 2022-01-28 (×2): qty 1

## 2022-01-28 MED ORDER — POTASSIUM CHLORIDE CRYS ER 10 MEQ PO TBCR
30.0000 meq | EXTENDED_RELEASE_TABLET | Freq: Once | ORAL | Status: AC
  Administered 2022-01-28: 30 meq via ORAL
  Filled 2022-01-28: qty 1

## 2022-01-28 MED ORDER — POTASSIUM CHLORIDE CRYS ER 20 MEQ PO TBCR: 20.0000 meq | EXTENDED_RELEASE_TABLET | Freq: Once | ORAL | Status: DC

## 2022-01-28 NOTE — Progress Notes (Signed)
Occupational Therapy Treatment Patient Details Name: Maria Dunlap MRN: [id number] DOB: 14-Jan-1951 Today's Date: 01/28/2022   History of present illness Maria Dunlap  is a 71 y.o. female, past medical history of hypertension, hyperlipidemia, alcohol abuse, patient with recent hospitalization at [facility] where she remained there for 9 days, hospitalization was related to alcohol withdrawal, hyponatremia of 112, Aerococcus UTI where she was treated with 5 days of IV vancomycin, patient remained encephalopathic/confused upon discharge from [facility], remain the same at the facility, he was brought to [facility], ED today given continued confusion, poor oral intake and drinking, patient is minimally interactive unable to provide any complaints, as discussed with the husband/daughter at bedside, patient with history of heavy alcohol abuse, but she was more interactive and appropriate before presented to [facility], she was driving until 5 months ago.  - in ED she appears altered, not able to answer any questions appropriately, family report this is no significant change from her new mental baseline since she was discharged from [facility], work-up significant for sodium of 151, potassium of 2.5, hemoglobin of 8.8, white blood cell count of 10.8, CT head with no acute findings, UA remains pending, [facility] hospitalist consulted to admit.   OT comments  Pt agreeable to OT treatment. Pt required much assist to sit upright from supine at EOB. Once seated pt required a minute or so of assist before she could sit upright without additional support. Pt was able to stand for transfer without RW with mod A. Mod to max A to transfer to chair without RW. Once in the chair pt completed multiple reps of sit to stand using RW with moderate assistance. Pt continues to progress in strength as noted by minor improvement in stand pivot transfer. Pt was left in chair with chair alarm set and family  present. Pt will benefit from continued OT in the hospital and recommended venue below to increase strength, balance, and endurance for safe ADL's.      Recommendations for follow up therapy are one component of a multi-disciplinary discharge planning process, led by the attending physician.  Recommendations may be updated based on patient status, additional functional criteria and insurance authorization.    Follow Up Recommendations  Skilled nursing-short term rehab (<3 hours/day)    Assistance Recommended at Discharge Frequent or constant Supervision/Assistance  Patient can return home with the following  A lot of help with walking and/or transfers;A lot of help with bathing/dressing/bathroom;Assistance with cooking/housework;Assistance with feeding;Assist for transportation;Help with stairs or ramp for entrance;Direct supervision/assist for medications management   Equipment Recommendations  None recommended by OT    Recommendations for Other Services      Precautions / Restrictions Precautions Precautions: Fall Restrictions Weight Bearing Restrictions: No       Mobility Bed Mobility Overal bed mobility: Needs Assistance Bed Mobility: Supine to Sit     Supine to sit: Mod assist, Max assist, HOB elevated     General bed mobility comments: Little active engagement by pt to sit upright in bed.    Transfers Overall transfer level: Needs assistance Equipment used: Rolling walker (2 wheels) Transfers: Sit to/from Stand, Bed to chair/wheelchair/BSC Sit to Stand: Mod assist (~x5 reps of sit to stand completed 2 without RW and 3 with RW.) Stand pivot transfers: Mod assist, Max assist         General transfer comment: Pt stood several times with and without RW from chair mostly. Moderate assist throughout attempts. Mod to max A  for stand pvot to chair without RW.     Balance Overall balance assessment: Needs assistance Sitting-balance support: Bilateral upper extremity  supported, Feet supported Sitting balance-Leahy Scale: Poor Sitting balance - Comments: seated EOB poor progressing to fair. Postural control: Posterior lean Standing balance support: Bilateral upper extremity supported, During functional activity, Reliant on assistive device for balance Standing balance-Leahy Scale: Poor Standing balance comment: poor to fair standing with RW                            Cognition Arousal/Alertness: Awake/alert Behavior During Therapy: WFL for tasks assessed/performed Overall Cognitive Status: Impaired/Different from baseline                                 General Comments: Pt engaged in jumbled expressive communication but was able to follow commands with verbal and tactile cuing.                   Pertinent Vitals/ Pain       Pain Assessment Pain Assessment: No/denies pain                                                          Frequency  Min 2X/week        Progress Toward Goals  OT Goals(current goals can now be found in the care plan section)  Progress towards OT goals: Progressing toward goals  Acute Rehab OT Goals Patient Stated Goal: open to return to rehab OT Goal Formulation: With patient Time For Goal Achievement: 02/08/22 Potential to Achieve Goals: Fair ADL Goals Pt Will Perform Eating: with modified independence;sitting Pt Will Perform Grooming: with min assist;sitting Pt Will Perform Upper Body Bathing: with min assist;sitting Pt Will Perform Upper Body Dressing: with min assist;sitting Pt Will Perform Lower Body Dressing: with mod assist;sitting/lateral leans;bed level Pt Will Transfer to Toilet: with mod assist;stand pivot transfer Pt/caregiver will Perform Home Exercise Program: Increased ROM;Increased strength;Both right and left upper extremity;With minimal assist  Plan Discharge plan remains appropriate                                    End  of Session Equipment Utilized During Treatment: Gait belt;Rolling walker (2 wheels)  OT Visit Diagnosis: Unsteadiness on feet (R26.81);Other abnormalities of gait and mobility (R26.89);Muscle weakness (generalized) (M62.81);Cognitive communication deficit (R41.841)   Activity Tolerance Patient tolerated treatment well   Patient Left in chair;with chair alarm set;with call bell/phone within reach;with family/visitor present   Nurse Communication Mobility status        Time: 6195-0932 OT Time Calculation (min): 23 min  Charges: OT General Charges $OT Visit: 1 Visit OT Treatments $Self Care/Home Management : 8-22 mins $Therapeutic Exercise: 8-22 mins  Maria Dunlap OT, MOT  Maria Dunlap 01/28/2022, 9:43 AM

## 2022-01-28 NOTE — Progress Notes (Signed)
PROGRESS NOTE  Maria Dunlap YDX:[id number] DOB: March 01, 1951 DOA: 01/22/2022 PCP: Patient, No Pcp Per  Brief History:  71 y.o. female, past medical history of hypertension, hyperlipidemia, alcohol abuse, patient with recent hospitalization at [facility] where she remained there for 9 days, hospitalization was related to alcohol withdrawal, hyponatremia of 112, Aerococcus UTI where she was treated with 5 days of IV vancomycin, patient remained encephalopathic/confused upon discharge from [facility], remain the same at the facility, he was brought to [facility], ED today given continued confusion, poor oral intake and drinking, patient is minimally interactive unable to provide any complaints, as discussed with the husband/daughter at bedside, patient with history of heavy alcohol abuse, but she was more interactive and appropriate before presented to [facility], she was driving until 5 months ago. - in ED she appears altered, not able to answer any questions appropriately, family report this is no significant change from her new mental baseline since she was discharged from [facility], work-up significant for sodium of 151, potassium of 2.5, hemoglobin of 8.8, white blood cell count of 10.8, CT head with no acute findings, UA remains pending, [facility] consulted to admit.     Assessment and Plan:  Hypernatremia - treated and resolved - add free water with improvement - D5W infusion initially ordered>>d/c as pt is now mildly hyponatremic - encourage oral free water  - pt refusing to eat or drink, feeding assist ordered - palliative care consultation requested   Dysphagia -Husband reports history of dysphagia -SLP evaluation was requested  -Dys 3 diet ordered by SLP   Failure to Thrive - with failure to thrive will request a palliative medicine consultation - IV fluid initially started -encourage eating, assist with feeding order placed - family  give hx of functional and cognitive decline over last 6 months - plan to go back to SNF with palliative to follow   Acute Metabolic encephalopathy - dementia metabolic work up in progress: normal B12 and folic acid level - MRI and CT brain, no acute findings - corrected sodium level noted above - Adult EEG requested and shows diffuse encephalopathy but no seizure activity - continue high dose IV thiamine given history of alcohol abuse--received 6 doses with slow improvement - follow up TSH: 2.159 - VBG  7.46/33/44/23 on RA - 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 in setting of underlying cognitive impairment  Hypokalemia - repleted - mag 2.1   Essential hypertension - resumed home amlodipine  - continue to follow   Anemia of chronic illness - iron saturation 24 - folate 9.6 - B12--770 - follow CBC, transfuse for Hg<7   Prolonged QTc - likely exacerbated by electrolyte derangements   History of B12 deficiency - recent B12 level confirms this has been repleted   GERD - continue PPI for GI protection    Hyperlipidemia - resumed home zetia and fish oil        Family Communication:   daughter updated at bedside 9/7   Consultants:  palliative   Code Status:  FULL    DVT Prophylaxis:  [place] Lovenox     Procedures: As Listed in Progress Note Above   Antibiotics: None      Subjective: Pt is pleasantly confused.  Denies cp, sob.  Remainder ROS unobtainable.    Objective: Vitals:   01/27/22 0904 01/27/22 1413 01/28/22 0429 01/28/22 1447  BP: 126/86 (!) 122/90 135/87 (!) 140/88  Pulse:  94  89 85  Resp:  19 16 16   Temp:  97.7 F (36.5 C) 97.6 F (36.4 C) 97.9 F (36.6 C)  TempSrc:  Oral Oral Axillary  SpO2:  100% 100%   Height:        Intake/Output Summary (Last 24 hours) at 01/28/2022 1658 Last data filed at 01/28/2022 1318 Gross per 24 hour  Intake 458.63 ml  Output 1000 ml  Net -541.37 ml   Weight change:  Exam:  General:  Pt  is alert, follows commands intermittently, not in acute distress HEENT: No icterus, No thrush, No neck mass, [place]/AT Cardiovascular: RRR, S1/S2, no rubs, no gallops Respiratory: bibasilar crackles. No wheeze Abdomen: Soft/+BS, non tender, non distended, no guarding Extremities: No edema, No lymphangitis, No petechiae, No rashes, no synovitis   Data Reviewed: I have personally reviewed following labs and imaging studies Basic Metabolic Panel: Recent Labs  Lab 01/22/22 1718 01/22/22 1950 01/23/22 0259 01/24/22 0455 01/25/22 0423 01/27/22 0310 01/28/22 0335  NA  --    < > 152* 143 135 135 134*  K  --    < > 4.1 3.7 3.3* 2.8* 3.4*  CL  --    < > 124* 120* 111 109 108  CO2  --    < > 21* 18* 20* 22 22  GLUCOSE  --    < > 91 124* 114* 98 97  BUN  --    < > 20 16 12 13 11   CREATININE  --    < > 0.85 0.82 0.58 0.66 0.56  CALCIUM  --    < > 8.5* 8.2* 8.2* 8.1* 8.0*  MG 2.1  --   --  2.0 1.7 1.7 2.1   < > = values in this interval not displayed.   Liver Function Tests: Recent Labs  Lab 01/22/22 1549  AST 38  ALT 22  ALKPHOS 63  BILITOT 1.7*  PROT 6.2*  ALBUMIN 3.0*   Recent Labs  Lab 01/22/22 1549  LIPASE 41   Recent Labs  Lab 01/22/22 1841  AMMONIA 23   Coagulation Profile: Recent Labs  Lab 01/22/22 1549  INR 1.2   CBC: Recent Labs  Lab 01/22/22 1549 01/23/22 0259 01/24/22 0455 01/26/22 0339 01/27/22 0310  WBC 10.8* 9.8 9.6 8.1 7.5  NEUTROABS 8.4*  --   --   --   --   HGB 8.8* 7.4* 7.3* 7.6* 7.6*  HCT 27.1* 23.4* 23.3* 23.5* 22.6*  MCV 101.1* 101.7* 103.1* 97.5 98.3  PLT 174 148* 164 191 201   Cardiac Enzymes: No results for input(s): "CKTOTAL", "CKMB", "CKMBINDEX", "TROPONINI" in the last 168 hours. BNP: Invalid input(s): "POCBNP" CBG: No results for input(s): "GLUCAP" in the last 168 hours. HbA1C: No results for input(s): "HGBA1C" in the last 72 hours. Urine analysis:    Component Value Date/Time   COLORURINE YELLOW 01/22/2022 1905    APPEARANCEUR CLOUDY (A) 01/22/2022 1905   LABSPEC 1.020 01/22/2022 1905   PHURINE 6.0 01/22/2022 1905   GLUCOSEU NEGATIVE 01/22/2022 1905   HGBUR TRACE (A) 01/22/2022 1905   BILIRUBINUR NEGATIVE 01/22/2022 1905   BILIRUBINUR neg 08/13/2013 1218   KETONESUR NEGATIVE 01/22/2022 1905   PROTEINUR TRACE (A) 01/22/2022 1905   UROBILINOGEN negative 08/13/2013 1218   NITRITE NEGATIVE 01/22/2022 1905   LEUKOCYTESUR NEGATIVE 01/22/2022 1905   Sepsis Labs: @LABRCNTIP (procalcitonin:4,lacticidven:4) ) Recent Results (from the past 240 hour(s))  Urine Culture     Status: Abnormal   Collection Time: 01/22/22  7:00 PM  Specimen: In/Out Cath Urine  Result Value Ref Range Status   Specimen Description   Final    IN/OUT CATH URINE Performed at [facility], [address], [place], [place] [address]    Special Requests   Final    NONE Performed at [facility], [address] [place], [place] [address]    Culture >=100,000 COLONIES/mL YEAST (A)  Final   Report Status 01/24/2022 FINAL  Final     Scheduled Meds:  amLODipine  5 mg Oral Daily   enoxaparin (LOVENOX) injection  40 mg Subcutaneous Q24H   ezetimibe  10 mg Oral Daily   feeding supplement  237 mL Oral BID BM   pantoprazole  40 mg Oral QHS   thiamine  100 mg Oral Daily   cyanocobalamin  500 mcg Oral Daily   Continuous Infusions:  dextrose 10 mL/hr at 01/28/22 1318   thiamine (VITAMIN B1) injection Stopped (2022/01/27 1745)    Procedures/Studies: EEG adult  Result Date: 2022-01-27 Charlsie Quest, MD     01/27/2022  8:45 AM Patient Name: MITHRA SPANO MRN: [id number] Epilepsy Attending: Charlsie Quest Referring Physician/Provider: Cleora Fleet, MD Date: 01/25/2022 Duration: 24.15 mins Patient history: 70 year old female with altered mental status.  EEG to evaluate for seizure. Level of alertness: Awake AEDs during EEG study: None Technical aspects: This EEG study was done with scalp electrodes positioned according to the  10-20 International system of electrode placement. Electrical activity was reviewed with band pass filter of 1-70Hz , sensitivity of 7 uV/mm, display speed of 72mm/sec with a 60Hz  notched filter applied as appropriate. EEG data were recorded continuously and digitally stored.  Video monitoring was available and reviewed as appropriate. Description: No clear posterior dominant rhythm was seen.  EEG showed continuous generalized 3 to 6 Hz theta-delta slowing.  Hyperventilation and photic stimulation were not performed.   ABNORMALITY - Continuous slow, generalized IMPRESSION: This study is suggestive of moderate diffuse encephalopathy, nonspecific etiology. No seizures or epileptiform discharges were seen throughout the recording.   MR BRAIN WO CONTRAST  Result Date: 01/22/2022 CLINICAL DATA:  Initial evaluation for altered mental status, delirium. EXAM: MRI HEAD WITHOUT CONTRAST TECHNIQUE: Multiplanar, multiecho pulse sequences of the brain and surrounding structures were obtained without intravenous contrast. COMPARISON:  Prior CT from earlier the same day. FINDINGS: Brain: Diffuse prominence of the CSF containing spaces compatible generalized cerebral atrophy. No significant cerebral white matter disease for age. No evidence for acute or subacute infarct. Gray-white matter differentiation maintained. No areas of chronic cortical infarction. No acute intracranial hemorrhage. Single punctate chronic microhemorrhage noted within the right cerebellum, of doubtful significance in isolation. No mass lesion, midline shift or mass effect. No hydrocephalus or extra-axial fluid collection. Pituitary gland and suprasellar region within normal limits. Vascular: Major intracranial vascular flow voids are maintained. Skull and upper cervical spine: Craniocervical junction within normal limits. Bone marrow signal intensity normal. No scalp soft tissue abnormality. Sinuses/Orbits: Globes orbital soft tissues  demonstrate no acute finding. Paranasal sinuses are largely clear. Trace left mastoid effusion noted, of doubtful significance. Other: None. IMPRESSION: 1. No acute intracranial abnormality. 2. Mildly advanced cerebral atrophy. Otherwise unremarkable brain MRI for age. Electronically Signed   By: 03/24/2022 M.D.   On: 01/22/2022 19:05   CT Head Wo Contrast  Result Date: 01/22/2022 CLINICAL DATA:  Mental status change, unknown cause. EXAM: CT HEAD WITHOUT CONTRAST TECHNIQUE: Contiguous axial images were obtained from the base of the skull through the vertex  without intravenous contrast. RADIATION DOSE REDUCTION: This exam was performed according to the departmental dose-optimization program which includes automated exposure control, adjustment of the mA and/or kV according to patient size and/or use of iterative reconstruction technique. COMPARISON:  Head CT 01/09/2022 FINDINGS: Brain: There is no evidence of an acute infarct, intracranial hemorrhage, mass, midline shift, or extra-axial fluid collection. There is mild cerebral atrophy. Periventricular white matter hypodensities are unchanged and nonspecific but compatible with mild chronic small vessel ischemic disease. Vascular: No hyperdense vessel. Skull: No fracture or suspicious osseous lesion. Sinuses/Orbits: No significant inflammatory disease in the included paranasal sinuses or mastoid air cells. Unremarkable orbits. Other: None. IMPRESSION: 1. No evidence of acute intracranial abnormality. 2. Mild chronic small vessel ischemic disease. Electronically Signed   By: Sebastian Ache M.D.   On: 01/22/2022 16:42   DG Chest 1 View  Result Date: 01/22/2022 CLINICAL DATA:  Chest pain with altered mental status. EXAM: CHEST  1 VIEW COMPARISON:  Radiographs 01/09/2022.  CT 01/09/2022. FINDINGS: 1617 hours. Lordotic positioning. The heart size and mediastinal contours are normal. The lungs are clear. There is no pleural effusion or pneumothorax. No acute  osseous findings are identified. There are degenerative changes at both shoulders. Telemetry leads overlie the chest. IMPRESSION: No evidence of active cardiopulmonary process. Electronically Signed   By: Carey Bullocks M.D.   On: 01/22/2022 16:24    Catarina Hartshorn, DO  [facility]  If 7PM-7AM, please contact night-coverage [email or web address] Password [email or web address] 01/28/2022, 4:58 PM   LOS: 6 days

## 2022-01-28 NOTE — Progress Notes (Signed)
AC was able to place IV on pt. PT has remained asleep most of the shift with family at bedside. Pt denied any pain of discomfort through out this shift.

## 2022-01-28 NOTE — Discharge Summary (Signed)
Physician Discharge Summary   Patient: Maria Dunlap MRN: [id number] DOB: 08-Sep-1950  Admit date:     01/22/2022  Discharge date: 01/29/2022  Discharge Physician: Onalee Hua Ghazal Pevey   PCP: Patient, No Pcp Per   Recommendations at discharge:   Please follow up with primary care provider within 1-2 weeks  Please repeat BMP, mag and CBC in one week Please assist patient with feeding    Hospital Course: 71 y.o. female, past medical history of hypertension, hyperlipidemia, alcohol abuse, patient with recent hospitalization at [facility] where she remained there for 9 days, hospitalization was related to alcohol withdrawal, hyponatremia of 112, Aerococcus UTI where she was treated with 5 days of IV vancomycin, patient remained encephalopathic/confused upon discharge from [facility], remain the same at the facility, he was brought to [facility], ED today given continued confusion, poor oral intake and drinking, patient is minimally interactive unable to provide any complaints, as discussed with the husband/daughter at bedside, patient with history of heavy alcohol abuse, but she was more interactive and appropriate before presented to [facility], she was driving until 5 months ago. - in ED she appears altered, not able to answer any questions appropriately, family report this is no significant change from her new mental baseline since she was discharged from [facility], work-up significant for sodium of 151, potassium of 2.5, hemoglobin of 8.8, white blood cell count of 10.8, CT head with no acute findings, UA remains pending, [facility] hospitalist consulted to admit. Work up was initiated for reversible causes of altered mental status including EEG, MRI brain, UA/culture, B12, TSH all of which were unremarkable.  She was started on high dose thiamine with small yet incremental improvement of her mental status.  After GOC discussions with the family, the plan was to d/c her to SNF with  palliative services.  Assessment and Plan: Hypernatremia - treated and resolved - add free water with improvement - D5W infusion initially ordered>>d/c as pt is now mildly hyponatremic - encourage oral free water  - pt refusing to eat or drink, feeding assist ordered - palliative care consultation requested   Dysphagia -Husband reports history of dysphagia -SLP evaluation was requested  -Dys 3 diet ordered by SLP   Failure to Thrive - with failure to thrive will request a palliative medicine consultation - IV fluid initially started -encourage eating, assist with feeding order placed - family give hx of functional and cognitive decline over last 6 months - plan to go back to SNF with palliative to follow   Acute Metabolic encephalopathy - dementia metabolic work up in progress: normal B12 and folic acid level - MRI and CT brain, no acute findings - corrected sodium level noted above - Adult EEG requested and shows diffuse encephalopathy but no seizure activity - continue high dose IV thiamine given history of alcohol abuse--received 6 doses with slow improvement - follow up TSH: 2.159 - VBG  7.46/33/44/23 on RA - 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 in setting of underlying cognitive impairment - overall mental status slowly improving but have explained to family to expect gradual decline in future despite medical optimization   Hypokalemia - repleted - mag 2.1 - discharge on KCl 20 mEQ daily - recheck K in 1 week   Essential hypertension - resumed home amlodipine  - will not restart HCTZ or lisinopril - BP controlled   Anemia of chronic illness - iron saturation 24 - folate 9.6 - B12--770 - follow CBC, transfuse for Hg<7  Prolonged QTc - likely exacerbated by electrolyte derangements   History of B12 deficiency - recent B12 level confirms this has been repleted   GERD - continue PPI for GI protection    Hyperlipidemia - resumed home  zetia and fish oil   Hypomagnesemia -start mag ox 400 mg bid        Consultants: palliative medicine Procedures performed: none  Disposition: Skilled nursing facility Diet recommendation:  Dysphagia type 3 Thin Liquid DISCHARGE MEDICATION: Allergies as of 01/29/2022       Reactions   Allopurinol    Elevated LFT's   Amoxicillin    Neck swell   Crestor [rosuvastatin]    dizziness   Macrolides And Ketolides    Mycinette [phenol]    Simvastatin    Elevated LFT's   Triamcinolone Rash        Medication List     STOP taking these medications    cefTRIAXone  IVPB Commonly known as: ROCEPHIN   cloNIDine 0.1 MG tablet Commonly known as: CATAPRES   febuxostat 40 MG tablet Commonly known as: ULORIC   hydrochlorothiazide 12.5 MG capsule Commonly known as: MICROZIDE   lisinopril 20 MG tablet Commonly known as: ZESTRIL   mirtazapine 15 MG tablet Commonly known as: REMERON   nystatin powder Generic drug: nystatin   QUEtiapine 25 MG tablet Commonly known as: SEROQUEL       TAKE these medications    amLODipine 5 MG tablet Commonly known as: NORVASC Take 5 mg by mouth daily.   cyanocobalamin 500 MCG tablet Commonly known as: VITAMIN B12 Take by mouth.   ezetimibe 10 MG tablet Commonly known as: ZETIA Take 10 mg by mouth daily.   feeding supplement Liqd Take 237 mLs by mouth 2 (two) times daily between meals.   fish oil-omega-3 fatty acids 1000 MG capsule Take 4 g by mouth daily.   magnesium oxide 400 (240 Mg) MG tablet Commonly known as: MAG-OX Take 1 tablet (400 mg total) by mouth 2 (two) times daily.   pantoprazole 40 MG tablet Commonly known as: PROTONIX Take 1 tablet by mouth every evening.   potassium chloride SA 20 MEQ tablet Commonly known as: KLOR-CON M Take 1 tablet (20 mEq total) by mouth daily. Start taking on: January 30, 2022   thiamine 100 MG tablet Commonly known as: Vitamin B-1 Take 1 tablet (100 mg total) by mouth  daily.        Discharge Exam: There were no vitals filed for this visit. HEENT:  [place]/AT, No thrush, no icterus CV:  RRR, no rub, no S3, no S4 Lung:  bibasilar rales. No wheeze Abd:  soft/+BS, NT Ext:  No edema, no lymphangitis, no synovitis, no rash   Condition at discharge: stable  The results of significant diagnostics from this hospitalization (including imaging, microbiology, ancillary and laboratory) are listed below for reference.   Imaging Studies: EEG adult  Result Date: 02-23-2022 Charlsie Quest, MD     23-Feb-2022  8:45 AM Patient Name: DONNELL BEAUCHAMP MRN: [id number] Epilepsy Attending: Charlsie Quest Referring Physician/Provider: Cleora Fleet, MD Date: 01/25/2022 Duration: 24.15 mins Patient history: 71 year old female with altered mental status.  EEG to evaluate for seizure. Level of alertness: Awake AEDs during EEG study: None Technical aspects: This EEG study was done with scalp electrodes positioned according to the 10-20 International system of electrode placement. Electrical activity was reviewed with band pass filter of 1-70Hz , sensitivity of 7 uV/mm, display speed of 63mm/sec with a 60Hz  notched  filter applied as appropriate. EEG data were recorded continuously and digitally stored.  Video monitoring was available and reviewed as appropriate. Description: No clear posterior dominant rhythm was seen.  EEG showed continuous generalized 3 to 6 Hz theta-delta slowing.  Hyperventilation and photic stimulation were not performed.   ABNORMALITY - Continuous slow, generalized IMPRESSION: This study is suggestive of moderate diffuse encephalopathy, nonspecific etiology. No seizures or epileptiform discharges were seen throughout the recording. Charlsie Quest   MR BRAIN WO CONTRAST  Result Date: 01/22/2022 CLINICAL DATA:  Initial evaluation for altered mental status, delirium. EXAM: MRI HEAD WITHOUT CONTRAST TECHNIQUE: Multiplanar, multiecho pulse sequences of the brain and  surrounding structures were obtained without intravenous contrast. COMPARISON:  Prior CT from earlier the same day. FINDINGS: Brain: Diffuse prominence of the CSF containing spaces compatible generalized cerebral atrophy. No significant cerebral white matter disease for age. No evidence for acute or subacute infarct. Gray-white matter differentiation maintained. No areas of chronic cortical infarction. No acute intracranial hemorrhage. Single punctate chronic microhemorrhage noted within the right cerebellum, of doubtful significance in isolation. No mass lesion, midline shift or mass effect. No hydrocephalus or extra-axial fluid collection. Pituitary gland and suprasellar region within normal limits. Vascular: Major intracranial vascular flow voids are maintained. Skull and upper cervical spine: Craniocervical junction within normal limits. Bone marrow signal intensity normal. No scalp soft tissue abnormality. Sinuses/Orbits: Globes orbital soft tissues demonstrate no acute finding. Paranasal sinuses are largely clear. Trace left mastoid effusion noted, of doubtful significance. Other: None. IMPRESSION: 1. No acute intracranial abnormality. 2. Mildly advanced cerebral atrophy. Otherwise unremarkable brain MRI for age. Electronically Signed   By: Rise Mu M.D.   On: 01/22/2022 19:05   CT Head Wo Contrast  Result Date: 01/22/2022 CLINICAL DATA:  Mental status change, unknown cause. EXAM: CT HEAD WITHOUT CONTRAST TECHNIQUE: Contiguous axial images were obtained from the base of the skull through the vertex without intravenous contrast. RADIATION DOSE REDUCTION: This exam was performed according to the departmental dose-optimization program which includes automated exposure control, adjustment of the mA and/or kV according to patient size and/or use of iterative reconstruction technique. COMPARISON:  Head CT 01/09/2022 FINDINGS: Brain: There is no evidence of an acute infarct, intracranial hemorrhage,  mass, midline shift, or extra-axial fluid collection. There is mild cerebral atrophy. Periventricular white matter hypodensities are unchanged and nonspecific but compatible with mild chronic small vessel ischemic disease. Vascular: No hyperdense vessel. Skull: No fracture or suspicious osseous lesion. Sinuses/Orbits: No significant inflammatory disease in the included paranasal sinuses or mastoid air cells. Unremarkable orbits. Other: None. IMPRESSION: 1. No evidence of acute intracranial abnormality. 2. Mild chronic small vessel ischemic disease. Electronically Signed   By: Sebastian Ache M.D.   On: 01/22/2022 16:42   DG Chest 1 View  Result Date: 01/22/2022 CLINICAL DATA:  Chest pain with altered mental status. EXAM: CHEST  1 VIEW COMPARISON:  Radiographs 01/09/2022.  CT 01/09/2022. FINDINGS: 1617 hours. Lordotic positioning. The heart size and mediastinal contours are normal. The lungs are clear. There is no pleural effusion or pneumothorax. No acute osseous findings are identified. There are degenerative changes at both shoulders. Telemetry leads overlie the chest. IMPRESSION: No evidence of active cardiopulmonary process. Electronically Signed   By: Carey Bullocks M.D.   On: 01/22/2022 16:24    Microbiology: Results for orders placed or performed during the hospital encounter of 01/22/22  Urine Culture     Status: Abnormal   Collection Time: 01/22/22  7:00 PM  Specimen: In/Out Cath Urine  Result Value Ref Range Status   Specimen Description   Final    IN/OUT CATH URINE Performed at [facility], [address] [place], [place] [address]    Special Requests   Final    NONE Performed at [facility], [address] [place], [place] [address]    Culture >=100,000 COLONIES/mL YEAST (A)  Final   Report Status 01/24/2022 FINAL  Final    Labs: CBC: Recent Labs  Lab 01/22/22 1549 01/23/22 0259 01/24/22 0455 01/26/22 0339 01/27/22 0310 01/29/22 0320  WBC 10.8* 9.8 9.6 8.1 7.5 8.4   NEUTROABS 8.4*  --   --   --   --   --   HGB 8.8* 7.4* 7.3* 7.6* 7.6* 7.7*  HCT 27.1* 23.4* 23.3* 23.5* 22.6* 23.6*  MCV 101.1* 101.7* 103.1* 97.5 98.3 98.7  PLT 174 148* 164 191 201 247   Basic Metabolic Panel: Recent Labs  Lab 01/24/22 0455 01/25/22 0423 01/27/22 0310 01/28/22 0335 01/29/22 0320  NA 143 135 135 134* 135  K 3.7 3.3* 2.8* 3.4* 3.3*  CL 120* 111 109 108 107  CO2 18* 20* 22 22 22   GLUCOSE 124* 114* 98 97 89  BUN 16 12 13 11 8   CREATININE 0.82 0.58 0.66 0.56 0.58  CALCIUM 8.2* 8.2* 8.1* 8.0* 8.3*  MG 2.0 1.7 1.7 2.1 1.8   Liver Function Tests: Recent Labs  Lab 01/22/22 1549  AST 38  ALT 22  ALKPHOS 63  BILITOT 1.7*  PROT 6.2*  ALBUMIN 3.0*   CBG: No results for input(s): "GLUCAP" in the last 168 hours.  Discharge time spent: greater than 30 minutes.  Signed: , MD [facility] 01/29/2022

## 2022-01-29 DIAGNOSIS — E876 Hypokalemia: Secondary | ICD-10-CM | POA: Diagnosis not present

## 2022-01-29 DIAGNOSIS — E870 Hyperosmolality and hypernatremia: Secondary | ICD-10-CM | POA: Diagnosis not present

## 2022-01-29 DIAGNOSIS — I10 Essential (primary) hypertension: Secondary | ICD-10-CM | POA: Diagnosis not present

## 2022-01-29 DIAGNOSIS — E538 Deficiency of other specified B group vitamins: Secondary | ICD-10-CM | POA: Diagnosis not present

## 2022-01-29 LAB — CBC
HCT: 23.6 % — ABNORMAL LOW (ref 36.0–46.0)
Hemoglobin: 7.7 g/dL — ABNORMAL LOW (ref 12.0–15.0)
MCH: 32.2 pg (ref 26.0–34.0)
MCHC: 32.6 g/dL (ref 30.0–36.0)
MCV: 98.7 fL (ref 80.0–100.0)
Platelets: 247 10*3/uL (ref 150–400)
RBC: 2.39 MIL/uL — ABNORMAL LOW (ref 3.87–5.11)
RDW: 18.6 % — ABNORMAL HIGH (ref 11.5–15.5)
WBC: 8.4 10*3/uL (ref 4.0–10.5)
nRBC: 0 % (ref 0.0–0.2)

## 2022-01-29 LAB — BASIC METABOLIC PANEL
Anion gap: 6 (ref 5–15)
BUN: 8 mg/dL (ref 8–23)
CO2: 22 mmol/L (ref 22–32)
Calcium: 8.3 mg/dL — ABNORMAL LOW (ref 8.9–10.3)
Chloride: 107 mmol/L (ref 98–111)
Creatinine, Ser: 0.58 mg/dL (ref 0.44–1.00)
GFR, Estimated: >60 mL/min (ref >60)
Glucose, Bld: 89 mg/dL (ref 70–99)
Potassium: 3.3 mmol/L — ABNORMAL LOW (ref 3.5–5.1)
Sodium: 135 mmol/L (ref 135–145)

## 2022-01-29 LAB — VITAMIN B1: Vitamin B1 (Thiamine): 81.7 nmol/L (ref 66.5–200.0)

## 2022-01-29 LAB — MAGNESIUM: Magnesium: 1.8 mg/dL (ref 1.7–2.4)

## 2022-01-29 MED ORDER — MAGNESIUM OXIDE -MG SUPPLEMENT 400 (240 MG) MG PO TABS: 400.0000 mg | ORAL_TABLET | Freq: Two times a day (BID) | ORAL | Status: DC

## 2022-01-29 MED ORDER — POTASSIUM CHLORIDE CRYS ER 20 MEQ PO TBCR: 20.0000 meq | EXTENDED_RELEASE_TABLET | Freq: Every day | ORAL | Status: DC

## 2022-01-29 MED ORDER — POTASSIUM CHLORIDE CRYS ER 20 MEQ PO TBCR
40.0000 meq | EXTENDED_RELEASE_TABLET | Freq: Once | ORAL | Status: AC
Start: 2022-01-29 — End: 2022-01-29
  Administered 2022-01-29: 40 meq via ORAL
  Filled 2022-01-29: qty 2

## 2022-01-29 MED ORDER — MAGNESIUM OXIDE -MG SUPPLEMENT 400 (240 MG) MG PO TABS: 400.0000 mg | ORAL_TABLET | Freq: Two times a day (BID) | ORAL | 1 refills | Status: DC

## 2022-01-29 MED ORDER — POTASSIUM CHLORIDE CRYS ER 20 MEQ PO TBCR: 20.0000 meq | EXTENDED_RELEASE_TABLET | Freq: Every day | ORAL | Status: AC

## 2022-01-29 NOTE — Progress Notes (Signed)
Nsg Discharge Note  Admit Date:  01/22/2022 Discharge date: 01/29/2022   [name] to be D/C'd Nursing Home per MD order.  AVS completed Patient/caregiver able to verbalize understanding.  Discharge Medication: Allergies as of 01/29/2022       Reactions   Allopurinol    Elevated LFT's   Amoxicillin    Neck swell   Crestor [rosuvastatin]    dizziness   Macrolides And Ketolides    Mycinette [phenol]    Simvastatin    Elevated LFT's   Triamcinolone Rash        Medication List     STOP taking these medications    cefTRIAXone  IVPB Commonly known as: ROCEPHIN   cloNIDine 0.1 MG tablet Commonly known as: CATAPRES   febuxostat 40 MG tablet Commonly known as: ULORIC   hydrochlorothiazide 12.5 MG capsule Commonly known as: MICROZIDE   lisinopril 20 MG tablet Commonly known as: ZESTRIL   mirtazapine 15 MG tablet Commonly known as: REMERON   nystatin powder Generic drug: nystatin   QUEtiapine 25 MG tablet Commonly known as: SEROQUEL       TAKE these medications    amLODipine 5 MG tablet Commonly known as: NORVASC Take 5 mg by mouth daily.   cyanocobalamin 500 MCG tablet Commonly known as: VITAMIN B12 Take by mouth.   ezetimibe 10 MG tablet Commonly known as: ZETIA Take 10 mg by mouth daily.   feeding supplement Liqd Take 237 mLs by mouth 2 (two) times daily between meals.   fish oil-omega-3 fatty acids 1000 MG capsule Take 4 g by mouth daily.   magnesium oxide 400 (240 Mg) MG tablet Commonly known as: MAG-OX Take 1 tablet (400 mg total) by mouth 2 (two) times daily.   pantoprazole 40 MG tablet Commonly known as: PROTONIX Take 1 tablet by mouth every evening.   potassium chloride SA 20 MEQ tablet Commonly known as: KLOR-CON M Take 1 tablet (20 mEq total) by mouth daily. Start taking on: January 30, 2022   thiamine 100 MG tablet Commonly known as: Vitamin B-1 Take 1 tablet (100 mg total) by mouth daily.        Discharge  Assessment: Vitals:   01/28/22 1447 01/29/22 0600  BP: (!) 140/88 (!) 139/91  Pulse: 85 85  Resp: 16 14  Temp: 97.9 F (36.6 C) 97.7 F (36.5 C)  SpO2:     Skin clean, dry and intact without evidence of skin break down, no evidence of skin tears noted. IV catheter discontinued intact. Site without signs and symptoms of complications - no redness or edema noted at insertion site, patient denies c/o pain - only slight tenderness at site.  Dressing with slight pressure applied.  D/c Instructions-Education: Discharge instructions given to patient/family with verbalized understanding. D/c education completed with patient/family including follow up instructions, medication list, d/c activities limitations if indicated, with other d/c instructions as indicated by MD - patient able to verbalize understanding, all questions fully answered. Patient instructed to return to ED, call 911, or call MD for any changes in condition.  Patient escorted via WC, and D/C home via private auto.  Verl Dicker, RN 01/29/2022 11:00 AM

## 2022-01-29 NOTE — TOC Transition Note (Signed)
Transition of Care [facility]) - CM/SW Discharge Note   Patient Details  Name: Maria Dunlap MRN: [id number] Date of Birth: 10/20/1950  [facility] CM/SW Contact:  Annice Needy, LCSW Phone Number: 01/29/2022, 12:45 PM   Clinical Narrative:    Discharge clinicals sent to facility. RN to call report. TOC signing off.    Final next level of care: Skilled Nursing Facility Barriers to Discharge: No Barriers Identified   Patient Goals and CMS Choice Patient states their goals for this hospitalization and ongoing recovery are:: return to SNF   Choice offered to / list presented to : Spouse  Discharge Placement              Patient chooses bed at: [facility] Patient to be transferred to facility by: [facility] Name of family member notified: spouse Patient and family notified of of transfer: 01/29/22  Discharge Plan and Services In-house Referral: Clinical Social Work   Post Acute Care Choice: Resumption of Svcs/PTA Provider                               Social Determinants of Health (SDOH) Interventions     Readmission Risk Interventions     No data to display

## 2022-01-29 NOTE — NC FL2 (Signed)
[place] MEDICAID [id number] LEVEL OF CARE SCREENING TOOL     IDENTIFICATION  Patient Name: Maria Dunlap Birthdate: 1950-06-06 Sex: female Admission Date (Current Location): 01/22/2022  [facility] and [place] Number:  Geophysical data processor and Address:  [facility], [address] [place], [place] [address]      Provider Number: [id number]  Attending Physician Name and Address:  Catarina Hartshorn, MD  Relative Name and Phone Number:  Sierrah, Luevano (Spouse)   [phone]    Current Level of Care: Hospital Recommended Level of Care: Skilled Nursing Facility Prior Approval Number:    Date Approved/Denied:   PASRR Number:    Discharge Plan: Home    Current Diagnoses: Patient Active Problem List   Diagnosis Date Noted   Dehydration    Hypokalemia    Hypernatremia 01/22/2022   AMS (altered mental status) 01/22/2022   Menopause    Symptoms, such as flushing, sleeplessness, headache, lack of concentration, associated with the menopause 01/08/2013   Routine gynecological examination 11/27/2012   HTN (hypertension) 10/23/2012   HLD (hyperlipidemia) 10/23/2012   B12 deficiency 10/23/2012   Post-menopause on HRT (hormone replacement therapy) 10/23/2012    Orientation RESPIRATION BLADDER Height & Weight     Self  Normal External catheter Weight:   Height:  4\' 9"  (144.8 cm)  BEHAVIORAL SYMPTOMS/MOOD NEUROLOGICAL BOWEL NUTRITION STATUS      Incontinent Diet (Dysphagia 1- see d/c summary for updates.)  AMBULATORY STATUS COMMUNICATION OF NEEDS Skin   Extensive Assist Verbally Normal                       Personal Care Assistance Level of Assistance  Bathing, Feeding, Dressing Bathing Assistance: Maximum assistance Feeding assistance: Limited assistance Dressing Assistance: Maximum assistance     Functional Limitations Info  Sight, Hearing, Speech Sight Info: Adequate Hearing Info: Adequate Speech Info: Adequate    SPECIAL CARE FACTORS  FREQUENCY  PT (By licensed PT)     PT Frequency: 5x weekly              Contractures      Additional Factors Info  Code Status, Allergies Code Status Info: Full code Allergies Info: Allopurinol, Amoxicillin, Crestor (rosuvastatin), Macrolides and Ketolides, Mycinette (phenol), Simvastatin, Triamcinolone           Current Medications (01/29/2022):  This is the current hospital active medication list Current Facility-Administered Medications  Medication Dose Route Frequency Provider Last Rate Last Admin   acetaminophen (TYLENOL) tablet 650 mg  650 mg Oral Q6H PRN Elgergawy, 03/31/2022, MD   650 mg at 01/23/22 1255   Or   acetaminophen (TYLENOL) suppository 650 mg  650 mg Rectal Q6H PRN Elgergawy, 03/25/22, MD       amLODipine (NORVASC) tablet 5 mg  5 mg Oral Daily Johnson, Clanford L, MD   5 mg at 01/29/22 0832   enoxaparin (LOVENOX) injection 40 mg  40 mg Subcutaneous Q24H Elgergawy, 03/31/22, MD   40 mg at 01/28/22 2240   ezetimibe (ZETIA) tablet 10 mg  10 mg Oral Daily Johnson, Clanford L, MD   10 mg at 01/29/22 0832   feeding supplement (ENSURE ENLIVE / ENSURE PLUS) liquid 237 mL  237 mL Oral BID BM Johnson, Clanford L, MD   237 mL at 01/29/22 0834   magnesium oxide (MAG-OX) tablet 400 mg  400 mg Oral BID Tat, David, MD       pantoprazole (PROTONIX) 2 mg/mL oral suspension 40 mg  40 mg Oral QHS Johnson, Clanford L, MD   40 mg at 01/28/22 2301   [START ON 01/30/2022] potassium chloride SA (KLOR-CON M) CR tablet 20 mEq  20 mEq Oral Daily Tat, David, MD       thiamine (VITAMIN B1) tablet 100 mg  100 mg Oral Daily Tat, David, MD   100 mg at 01/29/22 4540   vitamin B-12 (CYANOCOBALAMIN) tablet 500 mcg  500 mcg Oral Daily Elgergawy, Leana Roe, MD   500 mcg at 01/29/22 1149     Discharge Medications: Please see discharge summary for a list of discharge medications.  Relevant Imaging Results:  Relevant Lab Results:   Additional Information    Marjon Doxtater, Juleen China, LCSW

## 2022-01-29 NOTE — Progress Notes (Signed)
Physical Therapy Treatment Patient Details Name: Maria Dunlap MRN: [id number] DOB: May 29, 1950 Today's Date: 01/29/2022   History of Present Illness Maria Dunlap  is a 71 y.o. female, past medical history of hypertension, hyperlipidemia, alcohol abuse, patient with recent hospitalization at [facility] where she remained there for 9 days, hospitalization was related to alcohol withdrawal, hyponatremia of 112, Aerococcus UTI where she was treated with 5 days of IV vancomycin, patient remained encephalopathic/confused upon discharge from [facility], remain the same at the facility, he was brought to [facility], ED today given continued confusion, poor oral intake and drinking, patient is minimally interactive unable to provide any complaints, as discussed with the husband/daughter at bedside, patient with history of heavy alcohol abuse, but she was more interactive and appropriate before presented to [facility], she was driving until 5 months ago.  - in ED she appears altered, not able to answer any questions appropriately, family report this is no significant change from her new mental baseline since she was discharged from [facility], work-up significant for sodium of 151, potassium of 2.5, hemoglobin of 8.8, white blood cell count of 10.8, CT head with no acute findings, UA remains pending, [facility] hospitalist consulted to admit.    PT Comments    Patient requires frequent cueing for all mobility performed today. She requires assist to transition to seated EOB and demonstrates good sitting tolerance but poor balance. She is able to transfer to standing with HHA from PT and her husband. Patient ambulates several small steps at bedside to chair with HHA and demonstrates poor balance. She ends session seated in chair with husband present. Patient will benefit from continued skilled physical therapy in hospital and recommended venue below to increase strength, balance, endurance  for safe ADLs and gait.   Recommendations for follow up therapy are one component of a multi-disciplinary discharge planning process, led by the attending physician.  Recommendations may be updated based on patient status, additional functional criteria and insurance authorization.  Follow Up Recommendations  Skilled nursing-short term rehab (<3 hours/day) Can patient physically be transported by private vehicle: No   Assistance Recommended at Discharge Frequent or constant Supervision/Assistance  Patient can return home with the following A lot of help with walking and/or transfers;A lot of help with bathing/dressing/bathroom;Help with stairs or ramp for entrance   Equipment Recommendations  None recommended by PT    Recommendations for Other Services       Precautions / Restrictions Precautions Precautions: Fall Restrictions Weight Bearing Restrictions: No     Mobility  Bed Mobility Overal bed mobility: Needs Assistance Bed Mobility: Supine to Sit     Supine to sit: Mod assist, HOB elevated     General bed mobility comments: frequent cueing and assist to upright trunk to transition to seated EOB    Transfers Overall transfer level: Needs assistance Equipment used: 2 person hand held assist Transfers: Sit to/from Stand, Bed to chair/wheelchair/BSC Sit to Stand: Mod assist   Step pivot transfers: Mod assist, Max assist       General transfer comment: Patient transfers to standing with frequent cueing and HHA from PT and patients husband    Ambulation/Gait Ambulation/Gait assistance: Mod assist Gait Distance (Feet): 3 Feet Assistive device: 2 person hand held assist Gait Pattern/deviations: Shuffle Gait velocity: decreased     General Gait Details: slow, unsteady, frequent cueing required   Stairs             Wheelchair Mobility  Modified Rankin (Stroke Patients Only)       Balance Overall balance assessment: Needs  assistance Sitting-balance support: Bilateral upper extremity supported, Feet supported Sitting balance-Leahy Scale: Poor Sitting balance - Comments: seated EOB poor progressing to fair.   Standing balance support: Bilateral upper extremity supported Standing balance-Leahy Scale: Fair Standing balance comment: poor to fair standing with RW                            Cognition Arousal/Alertness: Awake/alert Behavior During Therapy: WFL for tasks assessed/performed Overall Cognitive Status: Impaired/Different from baseline                                 General Comments: Pt engaged in jumbled expressive communication but was able to follow commands with verbal and tactile cuing.        Exercises      General Comments        Pertinent Vitals/Pain Pain Assessment Pain Assessment: No/denies pain    Home Living                          Prior Function            PT Goals (current goals can now be found in the care plan section) Acute Rehab PT Goals Patient Stated Goal: return home PT Goal Formulation: With patient/family Time For Goal Achievement: 02/08/22 Potential to Achieve Goals: Good Progress towards PT goals: Progressing toward goals    Frequency    Min 2X/week      PT Plan Current plan remains appropriate    Co-evaluation              AM-PAC PT "6 Clicks" Mobility   Outcome Measure  Help needed turning from your back to your side while in a flat bed without using bedrails?: A Lot Help needed moving from lying on your back to sitting on the side of a flat bed without using bedrails?: A Lot Help needed moving to and from a bed to a chair (including a wheelchair)?: A Lot Help needed standing up from a chair using your arms (e.g., wheelchair or bedside chair)?: A Lot Help needed to walk in hospital room?: A Lot Help needed climbing 3-5 steps with a railing? : Total 6 Click Score: 11    End of Session    Activity Tolerance: Patient tolerated treatment well;Patient limited by fatigue Patient left: in chair;with call bell/phone within reach;with family/visitor present;with chair alarm set Nurse Communication: Mobility status PT Visit Diagnosis: Unsteadiness on feet (R26.81);Other abnormalities of gait and mobility (R26.89);Muscle weakness (generalized) (M62.81)     Time: 3151-7616 PT Time Calculation (min) (ACUTE ONLY): 13 min  Charges:  $Therapeutic Activity: 8-22 mins                     11:00 AM, 01/29/22 Wyman Songster PT, DPT Physical Therapist at [facility]

## 2022-01-29 NOTE — Care Management Important Message (Signed)
Important Message  Patient Details  Name: Maria Dunlap MRN: [id number] Date of Birth: 07-Jun-1950   Medicare Important Message Given:  Yes     Corey Harold 01/29/2022, 11:47 AM

## 2022-02-08 ENCOUNTER — Encounter (HOSPITAL_COMMUNITY): Payer: Self-pay

## 2022-02-08 ENCOUNTER — Emergency Department (HOSPITAL_COMMUNITY): Payer: Medicare Other

## 2022-02-08 ENCOUNTER — Other Ambulatory Visit: Payer: Self-pay

## 2022-02-08 ENCOUNTER — Emergency Department (HOSPITAL_COMMUNITY)
Admission: EM | Admit: 2022-02-08 | Discharge: 2022-02-09 | Disposition: A | Discharge status: Skilled Nursing Facility | Payer: Medicare Other | Attending: Emergency Medicine | Admitting: Emergency Medicine

## 2022-02-08 DIAGNOSIS — W19XXXA Unspecified fall, initial encounter: Secondary | ICD-10-CM

## 2022-02-08 DIAGNOSIS — I10 Essential (primary) hypertension: Secondary | ICD-10-CM | POA: Insufficient documentation

## 2022-02-08 DIAGNOSIS — S50311A Abrasion of right elbow, initial encounter: Secondary | ICD-10-CM

## 2022-02-08 DIAGNOSIS — R0789 Other chest pain: Secondary | ICD-10-CM | POA: Diagnosis not present

## 2022-02-08 DIAGNOSIS — Z79899 Other long term (current) drug therapy: Secondary | ICD-10-CM | POA: Insufficient documentation

## 2022-02-08 DIAGNOSIS — N390 Urinary tract infection, site not specified: Secondary | ICD-10-CM

## 2022-02-08 DIAGNOSIS — E860 Dehydration: Secondary | ICD-10-CM

## 2022-02-08 DIAGNOSIS — S20312A Abrasion of left front wall of thorax, initial encounter: Secondary | ICD-10-CM | POA: Diagnosis not present

## 2022-02-08 DIAGNOSIS — F0390 Unspecified dementia without behavioral disturbance: Secondary | ICD-10-CM | POA: Insufficient documentation

## 2022-02-08 DIAGNOSIS — Y92129 Unspecified place in nursing home as the place of occurrence of the external cause: Secondary | ICD-10-CM | POA: Diagnosis not present

## 2022-02-08 DIAGNOSIS — Z23 Encounter for immunization: Secondary | ICD-10-CM | POA: Insufficient documentation

## 2022-02-08 DIAGNOSIS — S20319A Abrasion of unspecified front wall of thorax, initial encounter: Secondary | ICD-10-CM

## 2022-02-08 DIAGNOSIS — S71112A Laceration without foreign body, left thigh, initial encounter: Secondary | ICD-10-CM | POA: Diagnosis not present

## 2022-02-08 DIAGNOSIS — Z043 Encounter for examination and observation following other accident: Secondary | ICD-10-CM | POA: Diagnosis present

## 2022-02-08 HISTORY — DX: Gastro-esophageal reflux disease without esophagitis: K21.9

## 2022-02-08 HISTORY — DX: Dysphagia, unspecified: R13.10

## 2022-02-08 HISTORY — DX: Unspecified cirrhosis of liver: K74.60

## 2022-02-08 HISTORY — DX: Alcohol abuse, uncomplicated: F10.10

## 2022-02-08 HISTORY — DX: Metabolic encephalopathy: G93.41

## 2022-02-08 LAB — CBC WITH DIFFERENTIAL/PLATELET
Abs Immature Granulocytes: 0.06 10*3/uL (ref 0.00–0.07)
Basophils Absolute: 0.1 10*3/uL (ref 0.0–0.1)
Basophils Relative: 1 %
Eosinophils Absolute: 0 10*3/uL (ref 0.0–0.5)
Eosinophils Relative: 0 %
HCT: 26.1 % — ABNORMAL LOW (ref 36.0–46.0)
Hemoglobin: 8.7 g/dL — ABNORMAL LOW (ref 12.0–15.0)
Immature Granulocytes: 1 %
Lymphocytes Relative: 15 %
Lymphs Abs: 1.6 10*3/uL (ref 0.7–4.0)
MCH: 32.1 pg (ref 26.0–34.0)
MCHC: 33.3 g/dL (ref 30.0–36.0)
MCV: 96.3 fL (ref 80.0–100.0)
Monocytes Absolute: 0.6 10*3/uL (ref 0.1–1.0)
Monocytes Relative: 6 %
Neutro Abs: 8.1 10*3/uL — ABNORMAL HIGH (ref 1.7–7.7)
Neutrophils Relative %: 77 %
Platelets: 194 10*3/uL (ref 150–400)
RBC: 2.71 MIL/uL — ABNORMAL LOW (ref 3.87–5.11)
RDW: 16.4 % — ABNORMAL HIGH (ref 11.5–15.5)
WBC: 10.4 10*3/uL (ref 4.0–10.5)
nRBC: 0 % (ref 0.0–0.2)

## 2022-02-08 LAB — BASIC METABOLIC PANEL
Anion gap: 9 (ref 5–15)
BUN: 16 mg/dL (ref 8–23)
CO2: 17 mmol/L — ABNORMAL LOW (ref 22–32)
Calcium: 9.1 mg/dL (ref 8.9–10.3)
Chloride: 107 mmol/L (ref 98–111)
Creatinine, Ser: 1.02 mg/dL — ABNORMAL HIGH (ref 0.44–1.00)
GFR, Estimated: 58 mL/min — ABNORMAL LOW (ref >60)
Glucose, Bld: 96 mg/dL (ref 70–99)
Potassium: 3.7 mmol/L (ref 3.5–5.1)
Sodium: 133 mmol/L — ABNORMAL LOW (ref 135–145)

## 2022-02-08 LAB — URINALYSIS, ROUTINE W REFLEX MICROSCOPIC
Bilirubin Urine: NEGATIVE
Glucose, UA: NEGATIVE mg/dL
Ketones, ur: NEGATIVE mg/dL
Nitrite: POSITIVE — AB
Protein, ur: NEGATIVE mg/dL
Specific Gravity, Urine: 1.013 (ref 1.005–1.030)
pH: 7 (ref 5.0–8.0)

## 2022-02-08 MED ORDER — CEFDINIR 300 MG PO CAPS: 300.0000 mg | ORAL_CAPSULE | Freq: Two times a day (BID) | ORAL | 0 refills | Status: DC

## 2022-02-08 MED ORDER — ACETAMINOPHEN 325 MG PO TABS
650.0000 mg | ORAL_TABLET | Freq: Once | ORAL | Status: AC
  Administered 2022-02-08: 650 mg via ORAL
  Filled 2022-02-08: qty 2

## 2022-02-08 MED ORDER — SODIUM CHLORIDE 0.9 % IV BOLUS (SEPSIS)
1000.0000 mL | Freq: Once | INTRAVENOUS | Status: AC
  Administered 2022-02-08: 1000 mL via INTRAVENOUS

## 2022-02-08 MED ORDER — SODIUM CHLORIDE 0.9 % IV SOLN
1.0000 g | Freq: Once | INTRAVENOUS | Status: AC
  Administered 2022-02-08: 1 g via INTRAVENOUS
  Filled 2022-02-08: qty 10

## 2022-02-08 MED ORDER — TETANUS-DIPHTH-ACELL PERTUSSIS 5-2.5-18.5 LF-MCG/0.5 IM SUSY
0.5000 mL | PREFILLED_SYRINGE | Freq: Once | INTRAMUSCULAR | Status: AC
Start: 2022-02-08 — End: 2022-02-08
  Administered 2022-02-08: 0.5 mL via INTRAMUSCULAR
  Filled 2022-02-08: qty 0.5

## 2022-02-08 NOTE — Discharge Instructions (Addendum)
You have been seen in the Emergency Department (ED) today following a fall.  Your workup today did not reveal any injuries that require you to stay in the hospital. You can expect to be stiff and sore for the next several days.  Please take Tylenol or Motrin as needed for pain, but only as written on the box.  You are mildly dehydrated and your urine showed evidence of a UTI.  We gave you fluids and antibiotics in the ER but we recommend you continue the antibiotics and complete the entire course as prescribed for the next 7 days.  Please follow up with your primary care doctor as soon as possible regarding today's ED visit and your recent accident.   Call your doctor or return to the ED if you develop a sudden or severe headache, confusion, slurred speech, facial droop, weakness or numbness in any arm or leg,  extreme fatigue, vomiting more than two times, severe abdominal pain, difficulty breathing or any other concerning signs or symptoms.

## 2022-02-08 NOTE — ED Notes (Signed)
Awaiting EMT transportation for this patient.

## 2022-02-08 NOTE — ED Provider Notes (Signed)
[facility] EMERGENCY DEPARTMENT Provider Note   CSN: [id number] Arrival date & time: 02/08/22  [id number]     History  Chief Complaint  Patient presents with   Maria Dunlap    Maria SanteeLinda C Deren is a 71 y.o. female.  With PMH of HTN, HLD, gout, dementia brought in by EMS for unwitnessed fall at [facility].  Not on anticoagulation.  Patient is a poor history provider due to underlying severe dementia.  However, she is denying any pain and is asking me for a hug to make her feel better.  She does not remember falling she denies any pain of movement with her arms or legs.  She denies any chest pain or shortness of breath  I spoke to her daughter who said she was called by the facility where she is currently staying at after an unwitnessed fall.  Her mother was found sitting on her bottom on the floor from her bed.  She did not lose consciousness.  She is otherwise at her mental baseline.  They are concerned that she has UTI because she has been complaining of urinary symptoms.  They keep asking the facility for testing but they have not tested.  HPI     Home Medications Prior to Admission medications   Medication Sig Start Date End Date Taking? Authorizing Provider  amLODipine (NORVASC) 5 MG tablet Take 5 mg by mouth daily. 06/19/21  Yes [provider]  cefdinir (OMNICEF) 300 MG capsule Take 1 capsule (300 mg total) by mouth 2 (two) times daily for 7 days. 02/08/22 02/15/22 Yes Mardene SayerBranham, Shawndell Varas C, MD  cyanocobalamin (VITAMIN B12) 500 MCG tablet Take by mouth. 01/19/22 01/19/23 Yes [provider]  feeding supplement (ENSURE ENLIVE / ENSURE PLUS) LIQD Take 237 mLs by mouth 2 (two) times daily between meals. 01/29/22  Yes Tat, Onalee Huaavid, MD  magnesium oxide (MAG-OX) 400 (240 Mg) MG tablet Take 1 tablet (400 mg total) by mouth 2 (two) times daily. 01/29/22  Yes Tat, Onalee Huaavid, MD  pantoprazole (PROTONIX) 40 MG tablet Take 1 tablet by mouth every evening. 07/17/21  Yes [provider]  potassium chloride SA (KLOR-CON M) 20 MEQ tablet Take 1 tablet (20 mEq total) by mouth daily. 01/30/22  Yes Tat, Onalee Huaavid, MD  thiamine (VITAMIN B-1) 100 MG tablet Take 1 tablet (100 mg total) by mouth daily. 01/29/22  Yes Tat, Onalee Huaavid, MD  ezetimibe (ZETIA) 10 MG tablet Take 10 mg by mouth daily. Patient not taking: Reported on 02/08/2022 06/19/21   [provider]  fish oil-omega-3 fatty acids 1000 MG capsule Take 4 g by mouth daily. Patient not taking: Reported on 01/22/2022    [provider]      Allergies    Amoxicillin, Allopurinol, Macrolides and ketolides, Mycinette [phenol], Nitrofurantoin, Simvastatin, Rosuvastatin, Triamcinolone, and Triamcinolone acetonide    Review of Systems   Review of Systems  Physical Exam Updated Vital Signs BP 120/84   Pulse 81   Temp 98.4 F (36.9 C) (Oral)   Resp (!) 31   Ht 5\' 1"  (1.549 m)   Wt 56.7 kg   SpO2 100%   BMI 23.62 kg/m  Physical Exam Constitutional: Alert and oriented to person and place not situation or year.  No acute distress, nontoxic  eyes: Conjunctivae are normal. ENT      Head: Normocephalic and atraumatic.      Nose: No congestion.      Mouth/Throat: Mucous membranes are moist.      Neck: No  stridor.  No midline tenderness, no step-offs, no deformities of the C/T/L-spine Cardiovascular: S1, S2,  Normal and symmetric distal pulses are present in all extremities.Warm and well perfused. Abrasions and mild erythema overlying the left posterior inferior chest wall with tenderness to palpation, no crepitus Respiratory: Normal respiratory effort. Breath sounds are normal.O2 sat 100 on RA Gastrointestinal: Soft and nontender. There is no CVA tenderness. Musculoskeletal: Normal range of motion in all extremities.  No pain with logroll of bilateral lower extremities.right elbow abrasion with Steri-Strips no bony tenderness and full range of motion intact, left medial thigh skin tear with no bony tenderness  or deformity and full range of motion intact of bilateral lower extremities.      Right lower leg: No tenderness or edema.      Left lower leg: No tenderness or edema. Neurologic: Normal speech and language. No gross focal neurologic deficits are appreciated. Skin: Skin is warm, dry and intact. No rash noted. Psychiatric: Mood and affect are normal. Speech and behavior are normal.  ED Results / Procedures / Treatments   Labs (all labs ordered are listed, but only abnormal results are displayed) Labs Reviewed  BASIC METABOLIC PANEL - Abnormal; Notable for the following components:      Result Value   Sodium 133 (*)    CO2 17 (*)    Creatinine, Ser 1.02 (*)    GFR, Estimated 58 (*)    All other components within normal limits  CBC WITH DIFFERENTIAL/PLATELET - Abnormal; Notable for the following components:   RBC 2.71 (*)    Hemoglobin 8.7 (*)    HCT 26.1 (*)    RDW 16.4 (*)    Neutro Abs 8.1 (*)    All other components within normal limits  URINALYSIS, ROUTINE W REFLEX MICROSCOPIC - Abnormal; Notable for the following components:   APPearance HAZY (*)    Hgb urine dipstick SMALL (*)    Nitrite POSITIVE (*)    Leukocytes,Ua TRACE (*)    Bacteria, UA RARE (*)    All other components within normal limits    EKG None  Radiology DG Pelvis Portable  Result Date: 02/08/2022 CLINICAL DATA:  Pelvic pain after fall. EXAM: PORTABLE PELVIS 1-2 VIEWS COMPARISON:  None Available. FINDINGS: There is no evidence of pelvic fracture or diastasis. No pelvic bone lesions are seen. IMPRESSION: Negative. Electronically Signed   By: Lupita Raider M.D.   On: 02/08/2022 15:18   CT Chest Wo Contrast  Result Date: 02/08/2022 CLINICAL DATA:  Unwitnessed fall EXAM: CT CHEST WITHOUT CONTRAST TECHNIQUE: Multidetector CT imaging of the chest was performed following the standard protocol without IV contrast. RADIATION DOSE REDUCTION: This exam was performed according to the departmental dose-optimization  program which includes automated exposure control, adjustment of the mA and/or kV according to patient size and/or use of iterative reconstruction technique. COMPARISON:  01/09/2022 FINDINGS: Cardiovascular: Heart size is normal. Trace pericardial fluid. Thoracic aorta is nonaneurysmal. Scattered atherosclerotic vascular calcifications of the aorta and coronary arteries. Central pulmonary vasculature is nondilated. Mediastinum/Nodes: No enlarged mediastinal or axillary lymph nodes. Thyroid gland, trachea, and esophagus demonstrate no significant findings. Lungs/Pleura: Lungs are clear. No pleural effusion or pneumothorax. Upper Abdomen: No acute abnormality. Musculoskeletal: No chest wall mass or suspicious bone lesions identified. Subtle superior endplate compression deformity of T4 is unchanged from prior. Degenerative disc disease of the imaged cervical spine and to a lesser degree within the lower thoracic spine. IMPRESSION: 1. No acute findings within the chest. 2. Aortic  and coronary artery atherosclerosis (ICD10-I70.0). Electronically Signed   By: Davina Poke D.O.   On: 02/08/2022 11:22   CT Head Wo Contrast  Result Date: 02/08/2022 CLINICAL DATA:  Unwitnessed fall EXAM: CT HEAD WITHOUT CONTRAST CT CERVICAL SPINE WITHOUT CONTRAST TECHNIQUE: Multidetector CT imaging of the head and cervical spine was performed following the standard protocol without intravenous contrast. Multiplanar CT image reconstructions of the cervical spine were also generated. RADIATION DOSE REDUCTION: This exam was performed according to the departmental dose-optimization program which includes automated exposure control, adjustment of the mA and/or kV according to patient size and/or use of iterative reconstruction technique. COMPARISON:  01/22/2022 FINDINGS: CT HEAD FINDINGS Brain: No evidence of acute infarction, hemorrhage, extra-axial collection, ventriculomegaly, or mass effect. Generalized cerebral atrophy.  Periventricular white matter low attenuation likely secondary to microangiopathy. Vascular: Cerebrovascular atherosclerotic calcifications are noted. No hyperdense vessels. Skull: Negative for fracture or focal lesion. Sinuses/Orbits: Visualized portions of the orbits are unremarkable. Visualized portions of the paranasal sinuses are unremarkable. Visualized portions of the mastoid air cells are unremarkable. Other: None. CT CERVICAL SPINE FINDINGS Alignment: Loss of the normal cervical lordosis with straightening. Minimal anterolisthesis of C3 on C4 and C4 on C5. Minimal retrolisthesis of C5 on C6. Skull base and vertebrae: No acute fracture. No primary bone lesion or focal pathologic process. Soft tissues and spinal canal: No prevertebral fluid or swelling. No visible canal hematoma. Disc levels: Degenerative disease with disc height loss at C4-5, C5-6 and C6-7. At C3-4 the there is mild bilateral facet arthropathy. At C4-5 there is mild bilateral facet arthropathy and mild right foraminal stenosis. At C5-6 there is a broad-based disc osteophyte complex, bilateral uncovertebral degenerative changes, bilateral foraminal stenosis and bilateral facet arthropathy. At C6-7 there is a broad-based disc osteophyte complex. Upper chest: Lung apices are clear. Other: No fluid collection or hematoma. IMPRESSION: 1. No acute intracranial pathology. 2.  No acute osseous injury of the cervical spine. 3. Cervical spine spondylosis as described above. Electronically Signed   By: Kathreen Devoid M.D.   On: 02/08/2022 11:21   CT Cervical Spine Wo Contrast  Result Date: 02/08/2022 CLINICAL DATA:  Unwitnessed fall EXAM: CT HEAD WITHOUT CONTRAST CT CERVICAL SPINE WITHOUT CONTRAST TECHNIQUE: Multidetector CT imaging of the head and cervical spine was performed following the standard protocol without intravenous contrast. Multiplanar CT image reconstructions of the cervical spine were also generated. RADIATION DOSE REDUCTION: This exam  was performed according to the departmental dose-optimization program which includes automated exposure control, adjustment of the mA and/or kV according to patient size and/or use of iterative reconstruction technique. COMPARISON:  01/22/2022 FINDINGS: CT HEAD FINDINGS Brain: No evidence of acute infarction, hemorrhage, extra-axial collection, ventriculomegaly, or mass effect. Generalized cerebral atrophy. Periventricular white matter low attenuation likely secondary to microangiopathy. Vascular: Cerebrovascular atherosclerotic calcifications are noted. No hyperdense vessels. Skull: Negative for fracture or focal lesion. Sinuses/Orbits: Visualized portions of the orbits are unremarkable. Visualized portions of the paranasal sinuses are unremarkable. Visualized portions of the mastoid air cells are unremarkable. Other: None. CT CERVICAL SPINE FINDINGS Alignment: Loss of the normal cervical lordosis with straightening. Minimal anterolisthesis of C3 on C4 and C4 on C5. Minimal retrolisthesis of C5 on C6. Skull base and vertebrae: No acute fracture. No primary bone lesion or focal pathologic process. Soft tissues and spinal canal: No prevertebral fluid or swelling. No visible canal hematoma. Disc levels: Degenerative disease with disc height loss at C4-5, C5-6 and C6-7. At C3-4 the there is mild bilateral facet  arthropathy. At C4-5 there is mild bilateral facet arthropathy and mild right foraminal stenosis. At C5-6 there is a broad-based disc osteophyte complex, bilateral uncovertebral degenerative changes, bilateral foraminal stenosis and bilateral facet arthropathy. At C6-7 there is a broad-based disc osteophyte complex. Upper chest: Lung apices are clear. Other: No fluid collection or hematoma. IMPRESSION: 1. No acute intracranial pathology. 2.  No acute osseous injury of the cervical spine. 3. Cervical spine spondylosis as described above. Electronically Signed   By: Elige Ko M.D.   On: 02/08/2022 11:21   DG  Elbow Complete Right  Result Date: 02/08/2022 CLINICAL DATA:  Status post fall, altered mental status EXAM: RIGHT ELBOW - COMPLETE 3+ VIEW COMPARISON:  None Available. FINDINGS: No acute fracture or dislocation. No aggressive osseous lesion. Normal alignment. Tiny ulnar humeral marginal osteophytes. Soft tissue are unremarkable. No radiopaque foreign body or soft tissue emphysema. IMPRESSION: 1. No acute osseous injury of the right elbow. Electronically Signed   By: Elige Ko M.D.   On: 02/08/2022 10:44    Procedures Procedures  Remain on constant cardiac monitoring, intermittent sinus tachycardia mixed with normal sinus rhythm  Medications Ordered in ED Medications  Tdap (BOOSTRIX) injection 0.5 mL (0.5 mLs Intramuscular Given 02/08/22 1046)  acetaminophen (TYLENOL) tablet 650 mg (650 mg Oral Given 02/08/22 1046)  sodium chloride 0.9 % bolus 1,000 mL (0 mLs Intravenous Stopped 02/08/22 1414)  cefTRIAXone (ROCEPHIN) 1 g in sodium chloride 0.9 % 100 mL IVPB (0 g Intravenous Stopped 02/08/22 1600)    ED Course/ Medical Decision Making/ A&P Clinical Course as of 02/08/22 1853  Mon Feb 08, 2022  1125 Radiology read resulted no traumatic injuries visualized on patient's CT head, CT C-spine or CT chest or plain film right elbow.  Labs remarkable for mild AKI with creatinine 1.02 and mild hyponatremia 133 likely due to decreased p.o. intake.  I have ordered for 1 L IV fluids.  Urine is pending.  Hemoglobin is 8.7 and stable and consistent with baseline for patient.  Normal white blood cell count 10.4.  Patient will receive IV fluids, plan to follow-up urine and then likely discharge back to facility. [VB]  1403 UA consistent with UTI with positive nitrite trace leukocyte esterase 11-20 WBCs and rare bacteria.  Ordered for 1 dose of IV Rocephin.  Will discharge back to facility with cefdinir prescription.  Discussed plan with patient's daughter at bedside who is in agreement with plan. [VB]     Clinical Course User Index [VB] Mardene Sayer, MD                           Medical Decision Making  ADALEY KIENE is a 71 y.o. female.  With PMH of HTN, HLD, gout, dementia brought in by EMS for unwitnessed fall at [facility].  Not on anticoagulation.  Patient is a poor historian due to underlying dementia and cannot tell me why she is here.  Her primary trauma survey is intact.  Secondary trauma survey notable for left posterior lower chest wall abrasions with tenderness to palpation of the rib cage, right elbow abrasion with Steri-Strips no bony tenderness and full range of motion intact, left medial thigh skin tear with no bony tenderness or deformity and full range of motion intact of bilateral lower extremities.  Because patient is a poor historian, basic labs and UA were obtained.  To evaluate for traumatic injury, CT head, CT C-spine, CT chest wall, right  elbow x-ray and pelvis x-ray were obtained.  Her tetanus was updated.  Her imaging showed no evidence of acute traumatic injury.  Her UA was consistent with a UTI and her labs were remarkable for mild AKI creatinine 1.02 with mild hyponatremia 133 likely hypovolemic in nature.  Patient was given IV fluids and IV Rocephin in the ED and discharged with 7 days of cefdinir for treatment of UTI.  She is safe for discharge back to her facility.  Discussed findings with patient's daughter at bedside.  Amount and/or Complexity of Data Reviewed Labs: ordered. Radiology: ordered.  Risk OTC drugs. Prescription drug management.    Final Clinical Impression(s) / ED Diagnoses Final diagnoses:  Fall, initial encounter  Abrasion of chest wall, initial encounter  Abrasion of right elbow, initial encounter  Dehydration  Urinary tract infection without hematuria, site unspecified    Rx / DC Orders ED Discharge Orders          Ordered    cefdinir (OMNICEF) 300 MG capsule  2 times daily        02/08/22 1405               Mardene Sayer, MD 02/08/22 1853

## 2022-02-08 NOTE — ED Notes (Signed)
Patient in/out cath with about 258mL urine drained.

## 2022-02-08 NOTE — ED Triage Notes (Signed)
Patient here via EMS from [facility] and rehab. Patient hx of dementia and alert ot slef only. Patient states she fell but doesn't not remember. Patient has skin tear to right posterior elbow and left inner thigh. Patient able to follow commands.

## 2022-02-08 NOTE — ED Notes (Signed)
Pt in bed, family at bedside, pt is confused, family states that pt would like some pain medication.

## 2022-02-08 NOTE — ED Notes (Signed)
Pt temporarily pulled to room 9 Pt cleaned up of stool Clean breif and paper pants placed on pt

## 2022-02-08 NOTE — ED Notes (Signed)
Pt in bed, family at bedside, pt awaiting a ride home

## 2022-02-09 MED ORDER — ACETAMINOPHEN 325 MG PO TABS
650.0000 mg | ORAL_TABLET | Freq: Once | ORAL | Status: AC
  Administered 2022-02-09: 650 mg via ORAL
  Filled 2022-02-09: qty 2

## 2022-02-09 MED ORDER — CEFDINIR 300 MG PO CAPS
300.0000 mg | ORAL_CAPSULE | Freq: Two times a day (BID) | ORAL | Status: DC
  Administered 2022-02-09: 300 mg via ORAL
  Filled 2022-02-09: qty 1

## 2022-02-09 MED ORDER — HALOPERIDOL 0.5 MG PO TABS
2.0000 mg | ORAL_TABLET | Freq: Once | ORAL | Status: AC
  Administered 2022-02-09: 2 mg via ORAL
  Filled 2022-02-09: qty 4

## 2022-02-09 NOTE — ED Notes (Signed)
Pt moved to room to promote rest

## 2022-02-09 NOTE — ED Notes (Signed)
Pt remains restless. Edp asked for something to assist in rest

## 2022-02-17 ENCOUNTER — Ambulatory Visit (INDEPENDENT_AMBULATORY_CARE_PROVIDER_SITE_OTHER): Payer: Medicare Other | Admitting: Family Medicine

## 2022-02-17 ENCOUNTER — Encounter: Payer: Self-pay | Admitting: Family Medicine

## 2022-02-17 VITALS — BP 111/74 | HR 133 | Temp 98.8°F | Ht 61.0 in | Wt 113.0 lb

## 2022-02-17 DIAGNOSIS — R627 Adult failure to thrive: Secondary | ICD-10-CM

## 2022-02-17 DIAGNOSIS — D539 Nutritional anemia, unspecified: Secondary | ICD-10-CM | POA: Diagnosis not present

## 2022-02-17 DIAGNOSIS — E876 Hypokalemia: Secondary | ICD-10-CM

## 2022-02-17 DIAGNOSIS — E871 Hypo-osmolality and hyponatremia: Secondary | ICD-10-CM

## 2022-02-17 DIAGNOSIS — R410 Disorientation, unspecified: Secondary | ICD-10-CM | POA: Diagnosis not present

## 2022-02-17 DIAGNOSIS — F1010 Alcohol abuse, uncomplicated: Secondary | ICD-10-CM | POA: Diagnosis not present

## 2022-02-17 DIAGNOSIS — R34 Anuria and oliguria: Secondary | ICD-10-CM

## 2022-02-17 DIAGNOSIS — K7030 Alcoholic cirrhosis of liver without ascites: Secondary | ICD-10-CM | POA: Diagnosis not present

## 2022-02-17 DIAGNOSIS — R9431 Abnormal electrocardiogram [ECG] [EKG]: Secondary | ICD-10-CM

## 2022-02-17 DIAGNOSIS — G9341 Metabolic encephalopathy: Secondary | ICD-10-CM

## 2022-02-17 NOTE — Progress Notes (Signed)
Subjective:  Patient ID: Maria Dunlap, female    DOB: Dec 15, 1950, 71 y.o.   MRN: [id number]  Patient Care Team: Patient, No Pcp Per as PCP - General (General Practice) Satira Sark, MD as PCP - Cardiology (Cardiology)   Chief Complaint:  New Patient (Initial Visit)   HPI: Maria Dunlap is a 71 y.o. female presenting on 02/17/2022 for New Patient (Initial Visit)   Patient presents today to establish care with new PCP after recent hospitalizations and skilled nursing facility admission.  She has a history of hypertension, hyperlipidemia, alcohol abuse, and gout.  She presented to [facility] emergency department on 01/09/2022 with her husband for generalized weakness and confusion.  Husband reported that patient drinks at least 1 pint of liquor per day and has been for several years.  ED labs reviewed sodium 112, potassium 2.6, BUN 27, creatinine 1.45.  She was admitted for hyponatremia, macrocytic anemia, disorientation, hypokalemia, acute cystitis, EtOH abuse, cirrhosis of the liver without ascites, and acute blood loss anemia.  CIWA protocol was initiated with use of Ativan.  She was discharged to skilled nursing facility on 01/19/2022.  She continued to decline while in the nursing facility.  She has had poor oral intake and was unable to care for self.  She was taken to the ED at [facility] on 01/22/2022 where she was admitted for altered mental status, hypernatremia, dysphagia, failure to thrive, acute metabolic encephalopathy, hypokalemia, essential hypertension, anemia of chronic illness, prolonged QT interval, B12 deficiency, GERD, hyperlipidemia, and hypomagnesemia.  Work-up during admission included an EEG, MRI brain UA with culture, B12 and TSH which were all unremarkable she was started on high-dose thiamine with minimal improvement of mental status.  She was discharged home to a skilled nursing facility with palliative services.  On 02/08/2022 she experienced a fall at the  facility and was transported back to [facility] emergency department for evaluation. All imaging was unremarkable.  Hemoglobin 8.7, hematocrit 26.1, sodium 133, creatinine 1.02, and GFR 58.  She was then discharged back to the nursing facility.  She has been at home with her husband.  He reports continued confusion, decreased oral intake, decreased appetite, and decreased ability to care for self.  Daughter is with patient today and reports patient has not had any alcohol in 5-1/2 weeks.  States she is still confused and not thriving. Follow-up PCP was Dr. Jacelyn Grip at [facility] physicians and those records have been requested.  Relevant past medical, surgical, family, and social history reviewed and updated as indicated.  Allergies and medications reviewed and updated. Data reviewed: Chart in Epic.   Past Medical History:  Diagnosis Date   Alcohol abuse    Dysphagia    Eczema    Essential hypertension    GERD (gastroesophageal reflux disease)    Gout    Hyperlipidemia    Liver cirrhosis (HCC)    Menopause    Metabolic encephalopathy    Prediabetes    Statin intolerance     Past Surgical History:  Procedure Laterality Date   ABDOMINAL HYSTERECTOMY     ABDOMINAL SURGERY      Social History   Socioeconomic History   Marital status: Married    Spouse name: Not on file   Number of children: Not on file   Years of education: Not on file   Highest education level: Not on file  Occupational History   Not on file  Tobacco Use   Smoking status: Former  Types: Cigarettes    Quit date: 05/24/1992    Years since quitting: 29.7   Smokeless tobacco: Not on file  Substance and Sexual Activity   Alcohol use: Yes    Comment: Occasional   Drug use: No   Sexual activity: Not on file  Other Topics Concern   Not on file  Social History Narrative   Not on file   Social Determinants of Health   Financial Resource Strain: Not on file  Food Insecurity: Not on file  Transportation Needs: Not  on file  Physical Activity: Not on file  Stress: Not on file  Social Connections: Not on file  Intimate Partner Violence: Not on file    Outpatient Encounter Medications as of 02/17/2022  Medication Sig   Ascorbic Acid (VITAMIN C WITH ROSE HIPS) 500 MG tablet Take 500 mg by mouth daily.   Chelated Zinc 50 MG TABS Take 1 tablet by mouth daily.   cyanocobalamin (VITAMIN B12) 500 MCG tablet Take by mouth.   folic acid (FOLVITE) 106 MCG tablet Take 400 mcg by mouth daily.   magnesium oxide (MAG-OX) 400 (240 Mg) MG tablet Take 1 tablet (400 mg total) by mouth 2 (two) times daily.   magnesium oxide (MAG-OX) 400 MG tablet Take 1 tablet by mouth daily.   potassium chloride SA (KLOR-CON M) 20 MEQ tablet Take 1 tablet (20 mEq total) by mouth daily.   thiamine (VITAMIN B1) 100 MG tablet Take 100 mg by mouth daily.   zinc gluconate 50 MG tablet SMARTSIG:1 caplet By Mouth Daily   [DISCONTINUED] amLODipine (NORVASC) 5 MG tablet Take 5 mg by mouth daily.   [DISCONTINUED] cefdinir (OMNICEF) 300 MG capsule Take 1 capsule (300 mg total) by mouth 2 (two) times daily for 7 days.   [DISCONTINUED] Cobalamin Combinations (VITAMIN B12-FOLIC ACID) 269-485 MCG TABS Take 1 tablet by mouth daily.   [DISCONTINUED] ezetimibe (ZETIA) 10 MG tablet Take 10 mg by mouth daily. (Patient not taking: Reported on 02/08/2022)   [DISCONTINUED] feeding supplement (ENSURE ENLIVE / ENSURE PLUS) LIQD Take 237 mLs by mouth 2 (two) times daily between meals.   [DISCONTINUED] fish oil-omega-3 fatty acids 1000 MG capsule Take 4 g by mouth daily. (Patient not taking: Reported on 01/22/2022)   [DISCONTINUED] lactulose (CHRONULAC) 10 GM/15ML solution Take 10 g by mouth 2 (two) times daily.   [DISCONTINUED] pantoprazole (PROTONIX) 40 MG tablet Take 1 tablet by mouth every evening.   [DISCONTINUED] Potassium Chloride ER 20 MEQ TBCR Take 1 tablet by mouth daily.   [DISCONTINUED] thiamine (VITAMIN B-1) 100 MG tablet Take 1 tablet (100 mg total) by  mouth daily.   No facility-administered encounter medications on file as of 02/17/2022.    Allergies  Allergen Reactions   Amoxicillin Swelling    Neck swell Throat swelling   Allopurinol Other (See Comments)    Elevated LFT's Elevated LFT's   Macrolides And Ketolides    Mycinette [Phenol]    Nitrofurantoin Other (See Comments)   Simvastatin Other (See Comments)    Elevated LFT's  Elevated LFT's   Rosuvastatin Rash    dizziness   Triamcinolone Rash and Other (See Comments)   Triamcinolone Acetonide Rash    Review of Systems  Unable to perform ROS: Mental status change (ROS per family)  Constitutional:  Positive for activity change, appetite change, fatigue and unexpected weight change. Negative for chills, diaphoresis and fever.  HENT: Negative.    Eyes: Negative.   Respiratory:  Negative for cough, chest tightness and  shortness of breath.   Cardiovascular:  Negative for chest pain, palpitations and leg swelling.  Gastrointestinal:  Negative for abdominal pain, blood in stool, constipation, diarrhea, nausea and vomiting.  Endocrine: Negative.   Genitourinary:  Positive for decreased urine volume and difficulty urinating. Negative for dysuria, frequency and urgency.  Musculoskeletal:  Negative for arthralgias and myalgias.  Skin:  Positive for color change and pallor.  Allergic/Immunologic: Negative.   Neurological:  Positive for weakness. Negative for dizziness and headaches.  Hematological: Negative.   Psychiatric/Behavioral:  Positive for agitation, confusion and sleep disturbance. Negative for behavioral problems, decreased concentration, dysphoric mood, hallucinations, self-injury and suicidal ideas. The patient is not nervous/anxious and is not hyperactive.   All other systems reviewed and are negative.       Objective:  BP 111/74   Pulse (!) 133   Temp 98.8 F (37.1 C)   Ht '5\' 1"'  (1.549 m)   Wt 113 lb (51.3 kg)   SpO2 95%   BMI 21.35 kg/m    Wt Readings  from Last 3 Encounters:  02/17/22 113 lb (51.3 kg)  02/08/22 125 lb (56.7 kg)  07/22/21 135 lb 9.6 oz (61.5 kg)    Physical Exam Vitals and nursing note reviewed.  Constitutional:      Appearance: She is normal weight. She is ill-appearing.  HENT:     Head: Normocephalic and atraumatic.  Eyes:     General: Scleral icterus present.     Pupils: Pupils are equal, round, and reactive to light.  Cardiovascular:     Rate and Rhythm: Normal rate and regular rhythm.     Heart sounds: Normal heart sounds.  Pulmonary:     Effort: Pulmonary effort is normal.     Breath sounds: Normal breath sounds.  Abdominal:     Palpations: Abdomen is soft.     Tenderness: There is no abdominal tenderness.  Musculoskeletal:     Cervical back: Neck supple.  Skin:    General: Skin is warm.     Capillary Refill: Capillary refill takes less than 2 seconds.     Coloration: Skin is jaundiced and pale.  Neurological:     Mental Status: She is alert. She is disoriented and confused.     GCS: GCS eye subscore is 4. GCS verbal subscore is 4. GCS motor subscore is 5.     Comments: Baseline mental status since admission to hospital per family  Psychiatric:        Mood and Affect: Affect is angry.        Speech: Speech is tangential.        Behavior: Behavior is agitated.        Cognition and Memory: Memory is impaired. She exhibits impaired remote memory.     Results for orders placed or performed during the hospital encounter of 59/93/57  Basic metabolic panel  Result Value Ref Range   Sodium 133 (L) 135 - 145 mmol/L   Potassium 3.7 3.5 - 5.1 mmol/L   Chloride 107 98 - 111 mmol/L   CO2 17 (L) 22 - 32 mmol/L   Glucose, Bld 96 70 - 99 mg/dL   BUN 16 8 - 23 mg/dL   Creatinine, Ser 1.02 (H) 0.44 - 1.00 mg/dL   Calcium 9.1 8.9 - 10.3 mg/dL   GFR, Estimated 58 (L) >60 mL/min   Anion gap 9 5 - 15  CBC with Differential  Result Value Ref Range   WBC 10.4 4.0 - 10.5 K/uL  RBC 2.71 (L) 3.87 - 5.11 MIL/uL    Hemoglobin 8.7 (L) 12.0 - 15.0 g/dL   HCT 26.1 (L) 36.0 - 46.0 %   MCV 96.3 80.0 - 100.0 fL   MCH 32.1 26.0 - 34.0 pg   MCHC 33.3 30.0 - 36.0 g/dL   RDW 16.4 (H) 11.5 - 15.5 %   Platelets 194 150 - 400 K/uL   nRBC 0.0 0.0 - 0.2 %   Neutrophils Relative % 77 %   Neutro Abs 8.1 (H) 1.7 - 7.7 K/uL   Lymphocytes Relative 15 %   Lymphs Abs 1.6 0.7 - 4.0 K/uL   Monocytes Relative 6 %   Monocytes Absolute 0.6 0.1 - 1.0 K/uL   Eosinophils Relative 0 %   Eosinophils Absolute 0.0 0.0 - 0.5 K/uL   Basophils Relative 1 %   Basophils Absolute 0.1 0.0 - 0.1 K/uL   Immature Granulocytes 1 %   Abs Immature Granulocytes 0.06 0.00 - 0.07 K/uL  Urinalysis, Routine w reflex microscopic Urine, In & Out Cath  Result Value Ref Range   Color, Urine YELLOW YELLOW   APPearance HAZY (A) CLEAR   Specific Gravity, Urine 1.013 1.005 - 1.030   pH 7.0 5.0 - 8.0   Glucose, UA NEGATIVE NEGATIVE mg/dL   Hgb urine dipstick SMALL (A) NEGATIVE   Bilirubin Urine NEGATIVE NEGATIVE   Ketones, ur NEGATIVE NEGATIVE mg/dL   Protein, ur NEGATIVE NEGATIVE mg/dL   Nitrite POSITIVE (A) NEGATIVE   Leukocytes,Ua TRACE (A) NEGATIVE   RBC / HPF 0-5 0 - 5 RBC/hpf   WBC, UA 11-20 0 - 5 WBC/hpf   Bacteria, UA RARE (A) NONE SEEN   Squamous Epithelial / LPF 0-5 0 - 5   Mucus PRESENT        Pertinent labs & imaging results that were available during my care of the patient were reviewed by me and considered in my medical decision making.  Assessment & Plan:  Anber was seen today for new patient (initial visit).  Diagnoses and all orders for this visit:  ETOH abuse Family reports pt has not had alcohol in 5.5 weeks. Will check labs today.  -     Anemia Profile B -     Thyroid Panel With TSH -     Folate -     Vitamin B1 -     Ammonia -     CMP14+EGFR -     Amylase -     Lipase  Alcoholic cirrhosis of liver without ascites (HCC) Referral to GI placed. Labs pending.  -     Folate -     Vitamin B1 -      Ammonia -     CMP14+EGFR -     Amylase -     Lipase  Disorientation Likely related to alcohol abuse, referral to neurology placed. Labs pending.  -     Thyroid Panel With TSH -     Folate -     Vitamin B1 -     Ammonia -     CMP14+EGFR -     Urine Culture -     Urinalysis, Routine w reflex microscopic -     Amylase -     Lipase  Macrocytic anemia Labs pending.  -     Anemia Profile B  Hypokalemia Will repeat labs today.  -     CMP14+EGFR  Hyponatremia Will repeat labs today.  -     CMP14+EGFR  Decreased urine output  Will recheck urine today.  -     Urine Culture -     Urinalysis, Routine w reflex microscopic  Metabolic encephalopathy Repeal labs today. Referral to neurology and GI today.   Failure to thrive in adult Boost or ensure daily. Encourage frequent feeding and oral intake.   Prolonged QT interval Likely from electrolyte disturbances. Avoid medications which could exacerbate QT prolongation.     Continue all other maintenance medications.  Follow up plan: Return in about 4 weeks (around 03/17/2022), or if symptoms worsen or fail to improve.   Continue healthy lifestyle choices, including diet (rich in fruits, vegetables, and lean proteins, and low in salt and simple carbohydrates) and exercise (at least 30 minutes of moderate physical activity daily).  Educational handout given for wernicke-korsakoff syndrome  The above assessment and management plan was discussed with the patient. The patient verbalized understanding of and has agreed to the management plan. Patient is aware to call the clinic if they develop any new symptoms or if symptoms persist or worsen. Patient is aware when to return to the clinic for a follow-up visit. Patient educated on when it is appropriate to go to the emergency department.   Monia Pouch, FNP-C [facility] [phone]

## 2022-02-18 ENCOUNTER — Other Ambulatory Visit: Payer: Medicare Other

## 2022-02-18 LAB — URINALYSIS, ROUTINE W REFLEX MICROSCOPIC
Bilirubin, UA: NEGATIVE
Glucose, UA: NEGATIVE
Ketones, UA: NEGATIVE
Leukocytes,UA: NEGATIVE
Nitrite, UA: NEGATIVE
Protein,UA: NEGATIVE
RBC, UA: NEGATIVE
Specific Gravity, UA: 1.015 (ref 1.005–1.030)
Urobilinogen, Ur: 0.2 mg/dL (ref 0.2–1.0)
pH, UA: 5 (ref 5.0–7.5)

## 2022-02-19 ENCOUNTER — Telehealth: Payer: Self-pay | Admitting: Family Medicine

## 2022-02-19 NOTE — Telephone Encounter (Signed)
Calling to have someone go over labs. Please call back.

## 2022-02-19 NOTE — Telephone Encounter (Signed)
Daughter aware of results.

## 2022-02-21 LAB — CMP14+EGFR
ALT: 17 IU/L (ref 0–32)
AST: 28 IU/L (ref 0–40)
Albumin/Globulin Ratio: 1.1 — ABNORMAL LOW (ref 1.2–2.2)
Albumin: 3.4 g/dL — ABNORMAL LOW (ref 3.8–4.8)
Alkaline Phosphatase: 128 IU/L — ABNORMAL HIGH (ref 44–121)
BUN/Creatinine Ratio: 8 — ABNORMAL LOW (ref 12–28)
BUN: 8 mg/dL (ref 8–27)
Bilirubin Total: 0.7 mg/dL (ref 0.0–1.2)
CO2: 13 mmol/L — ABNORMAL LOW (ref 20–29)
Calcium: 9.2 mg/dL (ref 8.7–10.3)
Chloride: 100 mmol/L (ref 96–106)
Creatinine, Ser: 1.05 mg/dL — ABNORMAL HIGH (ref 0.57–1.00)
Globulin, Total: 3 g/dL (ref 1.5–4.5)
Glucose: 132 mg/dL — ABNORMAL HIGH (ref 70–99)
Potassium: 4.5 mmol/L (ref 3.5–5.2)
Sodium: 133 mmol/L — ABNORMAL LOW (ref 134–144)
Total Protein: 6.4 g/dL (ref 6.0–8.5)
eGFR: 56 mL/min/{1.73_m2} — ABNORMAL LOW (ref >59)

## 2022-02-21 LAB — ANEMIA PROFILE B
Basophils Absolute: 0.1 10*3/uL (ref 0.0–0.2)
Basos: 1 %
EOS (ABSOLUTE): 0 10*3/uL (ref 0.0–0.4)
Eos: 0 %
Ferritin: 1489 ng/mL — ABNORMAL HIGH (ref 15–150)
Folate: 14.7 ng/mL (ref >3.0)
Hematocrit: 28.7 % — ABNORMAL LOW (ref 34.0–46.6)
Hemoglobin: 9.3 g/dL — ABNORMAL LOW (ref 11.1–15.9)
Immature Grans (Abs): 0.1 10*3/uL (ref 0.0–0.1)
Immature Granulocytes: 1 %
Iron Saturation: 32 % (ref 15–55)
Iron: 48 ug/dL (ref 27–139)
Lymphocytes Absolute: 2.1 10*3/uL (ref 0.7–3.1)
Lymphs: 17 %
MCH: 30.5 pg (ref 26.6–33.0)
MCHC: 32.4 g/dL (ref 31.5–35.7)
MCV: 94 fL (ref 79–97)
Monocytes Absolute: 0.7 10*3/uL (ref 0.1–0.9)
Monocytes: 5 %
Neutrophils Absolute: 9.2 10*3/uL — ABNORMAL HIGH (ref 1.4–7.0)
Neutrophils: 76 %
Platelets: 415 10*3/uL (ref 150–450)
RBC: 3.05 x10E6/uL — ABNORMAL LOW (ref 3.77–5.28)
RDW: 14.3 % (ref 11.7–15.4)
Retic Ct Pct: 2.6 % (ref 0.6–2.6)
Total Iron Binding Capacity: 148 ug/dL — ABNORMAL LOW (ref 250–450)
UIBC: 100 ug/dL — ABNORMAL LOW (ref 118–369)
Vitamin B-12: 1809 pg/mL — ABNORMAL HIGH (ref 232–1245)
WBC: 12.1 10*3/uL — ABNORMAL HIGH (ref 3.4–10.8)

## 2022-02-21 LAB — AMMONIA: Ammonia: 53 ug/dL (ref 31–169)

## 2022-02-21 LAB — URINE CULTURE

## 2022-02-21 LAB — VITAMIN B1: Thiamine: 189.1 nmol/L (ref 66.5–200.0)

## 2022-02-21 LAB — THYROID PANEL WITH TSH
Free Thyroxine Index: 3.1 (ref 1.2–4.9)
T3 Uptake Ratio: 31 % (ref 24–39)
T4, Total: 10 ug/dL (ref 4.5–12.0)
TSH: 1.71 u[IU]/mL (ref 0.450–4.500)

## 2022-02-21 LAB — LIPASE: Lipase: 45 U/L (ref 14–85)

## 2022-02-21 LAB — AMYLASE: Amylase: 105 U/L (ref 31–110)

## 2022-02-22 NOTE — Telephone Encounter (Signed)
Daughter informed. She believed that results on Mychart showed an infection. Advised that some of the abnormal results are slightly out of range but that it does not mean that she has a true infection. Daughter understood and will have pt increase her fluid intake and call back if needed.

## 2022-02-22 NOTE — Telephone Encounter (Signed)
Urine culture did not grow anything. Doe snot need antibiotic

## 2022-02-22 NOTE — Telephone Encounter (Signed)
Wants to know if someone is going to send medicine in for her today since PCP is out of the office and urine showed bacteria in it?   Please advise.

## 2022-02-23 DIAGNOSIS — N3000 Acute cystitis without hematuria: Secondary | ICD-10-CM | POA: Diagnosis not present

## 2022-02-23 DIAGNOSIS — G8929 Other chronic pain: Secondary | ICD-10-CM | POA: Diagnosis not present

## 2022-02-24 DIAGNOSIS — N3000 Acute cystitis without hematuria: Secondary | ICD-10-CM | POA: Diagnosis not present

## 2022-02-24 DIAGNOSIS — G8929 Other chronic pain: Secondary | ICD-10-CM | POA: Diagnosis not present

## 2022-02-25 DIAGNOSIS — G8929 Other chronic pain: Secondary | ICD-10-CM | POA: Diagnosis not present

## 2022-02-25 DIAGNOSIS — N3000 Acute cystitis without hematuria: Secondary | ICD-10-CM | POA: Diagnosis not present

## 2022-03-02 DIAGNOSIS — R7303 Prediabetes: Secondary | ICD-10-CM | POA: Diagnosis not present

## 2022-03-02 DIAGNOSIS — E876 Hypokalemia: Secondary | ICD-10-CM | POA: Diagnosis not present

## 2022-03-02 DIAGNOSIS — K219 Gastro-esophageal reflux disease without esophagitis: Secondary | ICD-10-CM | POA: Diagnosis not present

## 2022-03-02 DIAGNOSIS — Z9181 History of falling: Secondary | ICD-10-CM | POA: Diagnosis not present

## 2022-03-02 DIAGNOSIS — M109 Gout, unspecified: Secondary | ICD-10-CM | POA: Diagnosis not present

## 2022-03-02 DIAGNOSIS — D539 Nutritional anemia, unspecified: Secondary | ICD-10-CM | POA: Diagnosis not present

## 2022-03-02 DIAGNOSIS — R627 Adult failure to thrive: Secondary | ICD-10-CM | POA: Diagnosis not present

## 2022-03-02 DIAGNOSIS — E538 Deficiency of other specified B group vitamins: Secondary | ICD-10-CM | POA: Diagnosis not present

## 2022-03-02 DIAGNOSIS — R1310 Dysphagia, unspecified: Secondary | ICD-10-CM | POA: Diagnosis not present

## 2022-03-02 DIAGNOSIS — I10 Essential (primary) hypertension: Secondary | ICD-10-CM | POA: Diagnosis not present

## 2022-03-02 DIAGNOSIS — Z87891 Personal history of nicotine dependence: Secondary | ICD-10-CM | POA: Diagnosis not present

## 2022-03-02 DIAGNOSIS — E785 Hyperlipidemia, unspecified: Secondary | ICD-10-CM | POA: Diagnosis not present

## 2022-03-03 DIAGNOSIS — K219 Gastro-esophageal reflux disease without esophagitis: Secondary | ICD-10-CM | POA: Diagnosis not present

## 2022-03-03 DIAGNOSIS — R7303 Prediabetes: Secondary | ICD-10-CM | POA: Diagnosis not present

## 2022-03-03 DIAGNOSIS — E876 Hypokalemia: Secondary | ICD-10-CM | POA: Diagnosis not present

## 2022-03-03 DIAGNOSIS — R627 Adult failure to thrive: Secondary | ICD-10-CM | POA: Diagnosis not present

## 2022-03-03 DIAGNOSIS — I10 Essential (primary) hypertension: Secondary | ICD-10-CM | POA: Diagnosis not present

## 2022-03-03 DIAGNOSIS — Z9181 History of falling: Secondary | ICD-10-CM | POA: Diagnosis not present

## 2022-03-03 DIAGNOSIS — M109 Gout, unspecified: Secondary | ICD-10-CM | POA: Diagnosis not present

## 2022-03-03 DIAGNOSIS — Z87891 Personal history of nicotine dependence: Secondary | ICD-10-CM | POA: Diagnosis not present

## 2022-03-03 DIAGNOSIS — R1310 Dysphagia, unspecified: Secondary | ICD-10-CM | POA: Diagnosis not present

## 2022-03-03 DIAGNOSIS — E785 Hyperlipidemia, unspecified: Secondary | ICD-10-CM | POA: Diagnosis not present

## 2022-03-03 DIAGNOSIS — D539 Nutritional anemia, unspecified: Secondary | ICD-10-CM | POA: Diagnosis not present

## 2022-03-03 DIAGNOSIS — E538 Deficiency of other specified B group vitamins: Secondary | ICD-10-CM | POA: Diagnosis not present

## 2022-03-04 DIAGNOSIS — R1310 Dysphagia, unspecified: Secondary | ICD-10-CM | POA: Diagnosis not present

## 2022-03-04 DIAGNOSIS — E876 Hypokalemia: Secondary | ICD-10-CM | POA: Diagnosis not present

## 2022-03-04 DIAGNOSIS — Z87891 Personal history of nicotine dependence: Secondary | ICD-10-CM | POA: Diagnosis not present

## 2022-03-04 DIAGNOSIS — M109 Gout, unspecified: Secondary | ICD-10-CM | POA: Diagnosis not present

## 2022-03-04 DIAGNOSIS — Z9181 History of falling: Secondary | ICD-10-CM | POA: Diagnosis not present

## 2022-03-04 DIAGNOSIS — E785 Hyperlipidemia, unspecified: Secondary | ICD-10-CM | POA: Diagnosis not present

## 2022-03-04 DIAGNOSIS — D539 Nutritional anemia, unspecified: Secondary | ICD-10-CM | POA: Diagnosis not present

## 2022-03-04 DIAGNOSIS — K219 Gastro-esophageal reflux disease without esophagitis: Secondary | ICD-10-CM | POA: Diagnosis not present

## 2022-03-04 DIAGNOSIS — I10 Essential (primary) hypertension: Secondary | ICD-10-CM | POA: Diagnosis not present

## 2022-03-04 DIAGNOSIS — R627 Adult failure to thrive: Secondary | ICD-10-CM | POA: Diagnosis not present

## 2022-03-04 DIAGNOSIS — R7303 Prediabetes: Secondary | ICD-10-CM | POA: Diagnosis not present

## 2022-03-04 DIAGNOSIS — E538 Deficiency of other specified B group vitamins: Secondary | ICD-10-CM | POA: Diagnosis not present

## 2022-03-06 ENCOUNTER — Inpatient Hospital Stay (HOSPITAL_COMMUNITY)
Admission: EM | Admit: 2022-03-06 | Discharge: 2022-03-16 | DRG: 418 | Disposition: A | Discharge status: Home Health | Payer: Medicare Other | Attending: Internal Medicine | Admitting: Internal Medicine

## 2022-03-06 ENCOUNTER — Emergency Department (HOSPITAL_COMMUNITY): Payer: Medicare Other

## 2022-03-06 ENCOUNTER — Encounter (HOSPITAL_COMMUNITY): Payer: Self-pay | Admitting: *Deleted

## 2022-03-06 ENCOUNTER — Other Ambulatory Visit: Payer: Self-pay

## 2022-03-06 DIAGNOSIS — E512 Wernicke's encephalopathy: Secondary | ICD-10-CM | POA: Diagnosis not present

## 2022-03-06 DIAGNOSIS — M109 Gout, unspecified: Secondary | ICD-10-CM | POA: Diagnosis present

## 2022-03-06 DIAGNOSIS — D649 Anemia, unspecified: Secondary | ICD-10-CM | POA: Diagnosis not present

## 2022-03-06 DIAGNOSIS — K8000 Calculus of gallbladder with acute cholecystitis without obstruction: Secondary | ICD-10-CM | POA: Diagnosis not present

## 2022-03-06 DIAGNOSIS — E872 Acidosis, unspecified: Secondary | ICD-10-CM | POA: Diagnosis present

## 2022-03-06 DIAGNOSIS — D692 Other nonthrombocytopenic purpura: Secondary | ICD-10-CM | POA: Diagnosis not present

## 2022-03-06 DIAGNOSIS — R7989 Other specified abnormal findings of blood chemistry: Secondary | ICD-10-CM

## 2022-03-06 DIAGNOSIS — K8071 Calculus of gallbladder and bile duct without cholecystitis with obstruction: Secondary | ICD-10-CM | POA: Diagnosis not present

## 2022-03-06 DIAGNOSIS — J90 Pleural effusion, not elsewhere classified: Secondary | ICD-10-CM | POA: Diagnosis not present

## 2022-03-06 DIAGNOSIS — E8809 Other disorders of plasma-protein metabolism, not elsewhere classified: Secondary | ICD-10-CM | POA: Diagnosis present

## 2022-03-06 DIAGNOSIS — Z888 Allergy status to other drugs, medicaments and biological substances status: Secondary | ICD-10-CM

## 2022-03-06 DIAGNOSIS — R933 Abnormal findings on diagnostic imaging of other parts of digestive tract: Secondary | ICD-10-CM

## 2022-03-06 DIAGNOSIS — K2980 Duodenitis without bleeding: Secondary | ICD-10-CM | POA: Diagnosis not present

## 2022-03-06 DIAGNOSIS — Z5329 Procedure and treatment not carried out because of patient's decision for other reasons: Secondary | ICD-10-CM | POA: Diagnosis not present

## 2022-03-06 DIAGNOSIS — Z22322 Carrier or suspected carrier of Methicillin resistant Staphylococcus aureus: Secondary | ICD-10-CM

## 2022-03-06 DIAGNOSIS — R1310 Dysphagia, unspecified: Secondary | ICD-10-CM | POA: Diagnosis present

## 2022-03-06 DIAGNOSIS — R197 Diarrhea, unspecified: Secondary | ICD-10-CM | POA: Diagnosis not present

## 2022-03-06 DIAGNOSIS — K8064 Calculus of gallbladder and bile duct with chronic cholecystitis without obstruction: Secondary | ICD-10-CM | POA: Diagnosis present

## 2022-03-06 DIAGNOSIS — R109 Unspecified abdominal pain: Secondary | ICD-10-CM | POA: Diagnosis not present

## 2022-03-06 DIAGNOSIS — R7303 Prediabetes: Secondary | ICD-10-CM | POA: Diagnosis present

## 2022-03-06 DIAGNOSIS — R9431 Abnormal electrocardiogram [ECG] [EKG]: Secondary | ICD-10-CM

## 2022-03-06 DIAGNOSIS — K8050 Calculus of bile duct without cholangitis or cholecystitis without obstruction: Secondary | ICD-10-CM

## 2022-03-06 DIAGNOSIS — K7460 Unspecified cirrhosis of liver: Secondary | ICD-10-CM | POA: Diagnosis present

## 2022-03-06 DIAGNOSIS — F1010 Alcohol abuse, uncomplicated: Secondary | ICD-10-CM | POA: Diagnosis present

## 2022-03-06 DIAGNOSIS — E119 Type 2 diabetes mellitus without complications: Secondary | ICD-10-CM | POA: Diagnosis present

## 2022-03-06 DIAGNOSIS — M419 Scoliosis, unspecified: Secondary | ICD-10-CM | POA: Diagnosis not present

## 2022-03-06 DIAGNOSIS — K219 Gastro-esophageal reflux disease without esophagitis: Secondary | ICD-10-CM | POA: Diagnosis not present

## 2022-03-06 DIAGNOSIS — K851 Biliary acute pancreatitis without necrosis or infection: Principal | ICD-10-CM

## 2022-03-06 DIAGNOSIS — K760 Fatty (change of) liver, not elsewhere classified: Secondary | ICD-10-CM | POA: Diagnosis not present

## 2022-03-06 DIAGNOSIS — Z881 Allergy status to other antibiotic agents status: Secondary | ICD-10-CM

## 2022-03-06 DIAGNOSIS — N3000 Acute cystitis without hematuria: Secondary | ICD-10-CM

## 2022-03-06 DIAGNOSIS — K8051 Calculus of bile duct without cholangitis or cholecystitis with obstruction: Secondary | ICD-10-CM | POA: Diagnosis not present

## 2022-03-06 DIAGNOSIS — Z87891 Personal history of nicotine dependence: Secondary | ICD-10-CM

## 2022-03-06 DIAGNOSIS — D638 Anemia in other chronic diseases classified elsewhere: Secondary | ICD-10-CM | POA: Diagnosis present

## 2022-03-06 DIAGNOSIS — Z9049 Acquired absence of other specified parts of digestive tract: Secondary | ICD-10-CM | POA: Diagnosis not present

## 2022-03-06 DIAGNOSIS — E860 Dehydration: Secondary | ICD-10-CM | POA: Diagnosis not present

## 2022-03-06 DIAGNOSIS — Z79899 Other long term (current) drug therapy: Secondary | ICD-10-CM

## 2022-03-06 DIAGNOSIS — E785 Hyperlipidemia, unspecified: Secondary | ICD-10-CM | POA: Diagnosis not present

## 2022-03-06 DIAGNOSIS — K759 Inflammatory liver disease, unspecified: Secondary | ICD-10-CM | POA: Diagnosis not present

## 2022-03-06 DIAGNOSIS — K8010 Calculus of gallbladder with chronic cholecystitis without obstruction: Secondary | ICD-10-CM | POA: Diagnosis not present

## 2022-03-06 DIAGNOSIS — I10 Essential (primary) hypertension: Secondary | ICD-10-CM | POA: Diagnosis not present

## 2022-03-06 DIAGNOSIS — N390 Urinary tract infection, site not specified: Secondary | ICD-10-CM | POA: Diagnosis not present

## 2022-03-06 DIAGNOSIS — K8020 Calculus of gallbladder without cholecystitis without obstruction: Secondary | ICD-10-CM | POA: Diagnosis not present

## 2022-03-06 DIAGNOSIS — K5730 Diverticulosis of large intestine without perforation or abscess without bleeding: Secondary | ICD-10-CM | POA: Diagnosis not present

## 2022-03-06 DIAGNOSIS — K838 Other specified diseases of biliary tract: Secondary | ICD-10-CM | POA: Diagnosis not present

## 2022-03-06 DIAGNOSIS — Z90710 Acquired absence of both cervix and uterus: Secondary | ICD-10-CM | POA: Diagnosis not present

## 2022-03-06 DIAGNOSIS — E876 Hypokalemia: Secondary | ICD-10-CM

## 2022-03-06 DIAGNOSIS — R079 Chest pain, unspecified: Secondary | ICD-10-CM | POA: Diagnosis not present

## 2022-03-06 DIAGNOSIS — Z8249 Family history of ischemic heart disease and other diseases of the circulatory system: Secondary | ICD-10-CM

## 2022-03-06 DIAGNOSIS — K7030 Alcoholic cirrhosis of liver without ascites: Secondary | ICD-10-CM | POA: Diagnosis not present

## 2022-03-06 LAB — COMPREHENSIVE METABOLIC PANEL
ALT: 31 U/L (ref 0–44)
AST: 130 U/L — ABNORMAL HIGH (ref 15–41)
Albumin: 2.7 g/dL — ABNORMAL LOW (ref 3.5–5.0)
Alkaline Phosphatase: 178 U/L — ABNORMAL HIGH (ref 38–126)
Anion gap: 12 (ref 5–15)
BUN: 11 mg/dL (ref 8–23)
CO2: 18 mmol/L — ABNORMAL LOW (ref 22–32)
Calcium: 9 mg/dL (ref 8.9–10.3)
Chloride: 106 mmol/L (ref 98–111)
Creatinine, Ser: 0.78 mg/dL (ref 0.44–1.00)
GFR, Estimated: >60 mL/min (ref >60)
Glucose, Bld: 150 mg/dL — ABNORMAL HIGH (ref 70–99)
Potassium: 3.2 mmol/L — ABNORMAL LOW (ref 3.5–5.1)
Sodium: 136 mmol/L (ref 135–145)
Total Bilirubin: 2 mg/dL — ABNORMAL HIGH (ref 0.3–1.2)
Total Protein: 6.1 g/dL — ABNORMAL LOW (ref 6.5–8.1)

## 2022-03-06 LAB — URINALYSIS, ROUTINE W REFLEX MICROSCOPIC
Bilirubin Urine: NEGATIVE
Glucose, UA: NEGATIVE mg/dL
Hgb urine dipstick: NEGATIVE
Ketones, ur: 5 mg/dL — AB
Nitrite: NEGATIVE
Protein, ur: NEGATIVE mg/dL
Specific Gravity, Urine: 1.016 (ref 1.005–1.030)
WBC, UA: >50 WBC/hpf — ABNORMAL HIGH (ref 0–5)
pH: 5 (ref 5.0–8.0)

## 2022-03-06 LAB — CBC WITH DIFFERENTIAL/PLATELET
Abs Immature Granulocytes: 0.03 10*3/uL (ref 0.00–0.07)
Basophils Absolute: 0 10*3/uL (ref 0.0–0.1)
Basophils Relative: 0 %
Eosinophils Absolute: 0 10*3/uL (ref 0.0–0.5)
Eosinophils Relative: 0 %
HCT: 32.3 % — ABNORMAL LOW (ref 36.0–46.0)
Hemoglobin: 10.5 g/dL — ABNORMAL LOW (ref 12.0–15.0)
Immature Granulocytes: 0 %
Lymphocytes Relative: 17 %
Lymphs Abs: 1.8 10*3/uL (ref 0.7–4.0)
MCH: 31.1 pg (ref 26.0–34.0)
MCHC: 32.5 g/dL (ref 30.0–36.0)
MCV: 95.6 fL (ref 80.0–100.0)
Monocytes Absolute: 0.5 10*3/uL (ref 0.1–1.0)
Monocytes Relative: 5 %
Neutro Abs: 8 10*3/uL — ABNORMAL HIGH (ref 1.7–7.7)
Neutrophils Relative %: 78 %
Platelets: 263 10*3/uL (ref 150–400)
RBC: 3.38 MIL/uL — ABNORMAL LOW (ref 3.87–5.11)
RDW: 14.4 % (ref 11.5–15.5)
WBC: 10.4 10*3/uL (ref 4.0–10.5)
nRBC: 0 % (ref 0.0–0.2)

## 2022-03-06 LAB — LACTIC ACID, PLASMA
Lactic Acid, Venous: 3.6 mmol/L (ref 0.5–1.9)
Lactic Acid, Venous: 3.5 mmol/L (ref 0.5–1.9)
Lactic Acid, Venous: 1.9 mmol/L (ref 0.5–1.9)

## 2022-03-06 LAB — ETHANOL: Alcohol, Ethyl (B): <10 mg/dL (ref <10)

## 2022-03-06 LAB — PROTIME-INR
INR: 1 (ref 0.8–1.2)
Prothrombin Time: 12.8 seconds (ref 11.4–15.2)

## 2022-03-06 LAB — LIPASE, BLOOD: Lipase: 1643 U/L — ABNORMAL HIGH (ref 11–51)

## 2022-03-06 LAB — MAGNESIUM: Magnesium: 1.5 mg/dL — ABNORMAL LOW (ref 1.7–2.4)

## 2022-03-06 MED ORDER — SODIUM CHLORIDE 0.9 % IV SOLN: 2.0000 g | Freq: Once | INTRAVENOUS | Status: DC

## 2022-03-06 MED ORDER — ACETAMINOPHEN 650 MG RE SUPP: 650.0000 mg | Freq: Four times a day (QID) | RECTAL | Status: DC | PRN

## 2022-03-06 MED ORDER — MORPHINE SULFATE (PF) 4 MG/ML IV SOLN
4.0000 mg | Freq: Once | INTRAVENOUS | Status: AC
  Administered 2022-03-06: 4 mg via INTRAVENOUS
  Filled 2022-03-06: qty 1

## 2022-03-06 MED ORDER — POLYETHYLENE GLYCOL 3350 17 G PO PACK: 17.0000 g | PACK | Freq: Every day | ORAL | Status: DC | PRN

## 2022-03-06 MED ORDER — SODIUM CHLORIDE 0.9 % IV BOLUS
1000.0000 mL | Freq: Once | INTRAVENOUS | Status: AC
  Administered 2022-03-06: 1000 mL via INTRAVENOUS

## 2022-03-06 MED ORDER — LACTATED RINGERS IV SOLN: INTRAVENOUS | Status: DC

## 2022-03-06 MED ORDER — PANTOPRAZOLE SODIUM 40 MG IV SOLR
40.0000 mg | Freq: Once | INTRAVENOUS | Status: AC
  Administered 2022-03-06: 40 mg via INTRAVENOUS
  Filled 2022-03-06: qty 10

## 2022-03-06 MED ORDER — SODIUM CHLORIDE 0.9 % IV SOLN: 2.0000 g | Freq: Two times a day (BID) | INTRAVENOUS | Status: DC

## 2022-03-06 MED ORDER — MORPHINE SULFATE (PF) 4 MG/ML IV SOLN: 4.0000 mg | INTRAVENOUS | Status: DC | PRN

## 2022-03-06 MED ORDER — SODIUM CHLORIDE 0.9 % IV SOLN
2.0000 g | Freq: Once | INTRAVENOUS | Status: AC
  Administered 2022-03-06: 2 g via INTRAVENOUS
  Filled 2022-03-06: qty 12.5

## 2022-03-06 MED ORDER — SODIUM CHLORIDE 0.9 % IV SOLN: 2.0000 g | Freq: Three times a day (TID) | INTRAVENOUS | Status: DC

## 2022-03-06 MED ORDER — POTASSIUM CHLORIDE 10 MEQ/100ML IV SOLN
10.0000 meq | INTRAVENOUS | Status: AC
  Administered 2022-03-06 (×3): 10 meq via INTRAVENOUS
  Filled 2022-03-06 (×3): qty 100

## 2022-03-06 MED ORDER — POTASSIUM CHLORIDE 10 MEQ/100ML IV SOLN
INTRAVENOUS | Status: AC
  Administered 2022-03-06: 10 meq
  Filled 2022-03-06: qty 100

## 2022-03-06 MED ORDER — ENOXAPARIN SODIUM 40 MG/0.4ML IJ SOSY
40.0000 mg | PREFILLED_SYRINGE | INTRAMUSCULAR | Status: DC
  Administered 2022-03-06 – 2022-03-15 (×9): 40 mg via SUBCUTANEOUS
  Filled 2022-03-06 (×10): qty 0.4

## 2022-03-06 MED ORDER — METRONIDAZOLE 500 MG/100ML IV SOLN: 500.0000 mg | Freq: Once | INTRAVENOUS | Status: DC

## 2022-03-06 MED ORDER — LACTATED RINGERS IV BOLUS (SEPSIS)
530.0000 mL | Freq: Once | INTRAVENOUS | Status: AC
  Administered 2022-03-06: 530 mL via INTRAVENOUS

## 2022-03-06 MED ORDER — ONDANSETRON HCL 4 MG/2ML IJ SOLN
4.0000 mg | Freq: Once | INTRAMUSCULAR | Status: AC
  Administered 2022-03-06: 4 mg via INTRAVENOUS
  Filled 2022-03-06: qty 2

## 2022-03-06 MED ORDER — LACTATED RINGERS IV BOLUS
500.0000 mL | Freq: Once | INTRAVENOUS | Status: AC
  Administered 2022-03-06: 500 mL via INTRAVENOUS

## 2022-03-06 MED ORDER — PROMETHAZINE HCL 12.5 MG PO TABS: 12.5000 mg | ORAL_TABLET | Freq: Four times a day (QID) | ORAL | Status: DC | PRN

## 2022-03-06 MED ORDER — SODIUM CHLORIDE 0.9 % IV SOLN
1.0000 g | INTRAVENOUS | Status: AC
  Administered 2022-03-07 – 2022-03-09 (×3): 1 g via INTRAVENOUS
  Filled 2022-03-06 (×3): qty 10

## 2022-03-06 MED ORDER — MAGNESIUM SULFATE 2 GM/50ML IV SOLN
2.0000 g | Freq: Once | INTRAVENOUS | Status: AC
  Administered 2022-03-06: 2 g via INTRAVENOUS
  Filled 2022-03-06: qty 50

## 2022-03-06 MED ORDER — IOHEXOL 350 MG/ML SOLN
80.0000 mL | Freq: Once | INTRAVENOUS | Status: AC | PRN
  Administered 2022-03-06: 80 mL via INTRAVENOUS

## 2022-03-06 MED ORDER — METRONIDAZOLE 500 MG/100ML IV SOLN
500.0000 mg | Freq: Once | INTRAVENOUS | Status: AC
  Administered 2022-03-06: 500 mg via INTRAVENOUS
  Filled 2022-03-06: qty 100

## 2022-03-06 MED ORDER — ACETAMINOPHEN 325 MG PO TABS
650.0000 mg | ORAL_TABLET | Freq: Four times a day (QID) | ORAL | Status: DC | PRN
  Administered 2022-03-07: 650 mg via ORAL
  Filled 2022-03-06: qty 2

## 2022-03-06 NOTE — ED Notes (Signed)
Per EDP ok for ice chips

## 2022-03-06 NOTE — ED Triage Notes (Signed)
Pt with c/o abd pain since 1100 today with emesis. Husband states diarrhea yesterday.

## 2022-03-06 NOTE — Assessment & Plan Note (Addendum)
Potassium 3.2.  Also hypomagnesemia 1.5.  Likely from vomiting and diarrhea. -Repleted

## 2022-03-06 NOTE — Assessment & Plan Note (Addendum)
Presenting with abdominal pain vomiting and diarrhea yesterday.  Lipase elevated at 1643, minimal liver enzyme elevation, AST 130, and ALP of 178, total bilirubin 2.  ALT WNL- 31.  CT findings-just an acute pancreatitis, secondary to duodenitis.  No associated complication.  Also shows cholelithiasis, mildly dilated common bile duct- consider ERCP or MRCP to exclude choledocholithiasis as clinically warranted. -  EDP talked to Dr. Abbey Chatters, recommended consulting [facility]. EDP talked with Dr. Loletha Carrow on-call for GI, recommended patient stay here at [facility], patient likely passed stone, transfer to [facility] on Monday-10/16 if ERCP needed -Patient refused MRCP today after extensive counseling continues to refuse study

## 2022-03-06 NOTE — Assessment & Plan Note (Addendum)
History of alcohol abuse.  Last alcoholic beverage 6 weeks ago at least.  Has several small purpura to bilateral lower extremities.  Platelets within normal at 263. -Check PT/INR

## 2022-03-06 NOTE — Progress Notes (Addendum)
Pharmacy Antibiotic Note  Maria Dunlap a 71 y.o. female admitted on 03/06/2022 with  intra abdominal infection .  Pharmacy has been consulted for cefepime dosing.  Plan: Cefepime 2gm IV every 12 hours.  Medical History: Past Medical History:  Diagnosis Date   Alcohol abuse    Dysphagia    Eczema    Essential hypertension    GERD (gastroesophageal reflux disease)    Gout    Hyperlipidemia    Liver cirrhosis (HCC)    Menopause    Metabolic encephalopathy    Prediabetes    Statin intolerance     Allergies:  Allergies  Allergen Reactions   Amoxicillin Swelling    Neck swell Throat swelling   Allopurinol Other (See Comments)    Elevated LFT's Elevated LFT's   Macrolides And Ketolides    Mycinette [Phenol]    Nitrofurantoin Other (See Comments)   Simvastatin Other (See Comments)    Elevated LFT's  Elevated LFT's   Rosuvastatin Rash    dizziness   Triamcinolone Rash and Other (See Comments)   Triamcinolone Acetonide Rash    There were no vitals filed for this visit.     Latest Ref Rng & Units 03/06/2022    3:30 PM 02/17/2022   12:53 PM 02/08/2022   10:50 AM  CBC  WBC 4.0 - 10.5 K/uL 10.4  12.1  10.4   Hemoglobin 12.0 - 15.0 g/dL 10.5  9.3  8.7   Hematocrit 36.0 - 46.0 % 32.3  28.7  26.1   Platelets 150 - 400 K/uL 263  415  194      CrCl cannot be calculated (Unknown ideal weight.).  Antibiotics Given (last 72 hours)     Date/Time Action Medication Dose Rate   03/06/22 1705 New Bag/Given   metroNIDAZOLE (FLAGYL) IVPB 500 mg 500 mg 100 mL/hr   03/06/22 1709 New Bag/Given   ceFEPIme (MAXIPIME) 2 g in sodium chloride 0.9 % 100 mL IVPB 2 g 200 mL/hr       Antimicrobials this admission:  Cefepime 03/06/2022  >>  Metronidazole 03/06/2022   x 1   Microbiology results: 03/06/2022  BCx: sent 03/06/2022  UCx: sent   Thank you for allowing pharmacy to be a part of this patient's care.  Thomasenia Sales, PharmD Clinical Pharmacist

## 2022-03-06 NOTE — Assessment & Plan Note (Signed)
QTc 536.  Not on QT prolonging medications. -Repleted electrolytes

## 2022-03-06 NOTE — ED Notes (Signed)
Patient transported to CT 

## 2022-03-06 NOTE — Assessment & Plan Note (Signed)
-   [id number]

## 2022-03-06 NOTE — ED Notes (Signed)
Unable to obtain oral temperature.  

## 2022-03-06 NOTE — ED Provider Notes (Signed)
[facility] EMERGENCY DEPARTMENT Provider Note   CSN: [id number] Arrival date & time: 03/06/22  1448     History {Add pertinent medical, surgical, social history, OB history to HPI:1} Chief Complaint  Patient presents with   Abdominal Pain    Maria Dunlap is a 71 y.o. female.  Brought in by her husband for evaluation of abdominal pain.  She had some diarrhea yesterday.  Today started with upper abdominal pain and 3 episodes of vomiting nonbloody.  Denies history of same.  No known fevers.  No sick contacts or recent travel.  She has a history of alcohol abuse currently not drinking.  She does not know if she had any prior surgeries.  Per her medical record she has a history of cirrhosis  The history is provided by the patient and the spouse.  Abdominal Pain Pain location:  Generalized Pain severity:  Severe Onset quality:  Sudden Duration:  3 hours Timing:  Constant Progression:  Unchanged Chronicity:  New Context: not sick contacts, not suspicious food intake and not trauma   Relieved by:  None tried Worsened by:  Nothing Ineffective treatments:  None tried Associated symptoms: diarrhea, nausea and vomiting   Associated symptoms: no chest pain, no cough, no fever and no hematemesis        Home Medications Prior to Admission medications   Medication Sig Start Date End Date Taking? Authorizing Provider  Ascorbic Acid (VITAMIN C WITH ROSE HIPS) 500 MG tablet Take 500 mg by mouth daily. 02/13/22   [provider]  Chelated Zinc 50 MG TABS Take 1 tablet by mouth daily. 02/12/22   [provider]  cyanocobalamin (VITAMIN B12) 500 MCG tablet Take by mouth. 01/19/22 01/19/23  [provider]  folic acid (FOLVITE) 350 MCG tablet Take 400 mcg by mouth daily. 02/13/22   [provider]  magnesium oxide (MAG-OX) 400 (240 Mg) MG tablet Take 1 tablet (400 mg total) by mouth 2 (two) times daily. 01/29/22   Orson Eva, MD  magnesium oxide (MAG-OX) 400  MG tablet Take 1 tablet by mouth daily. 02/12/22   [provider]  potassium chloride SA (KLOR-CON M) 20 MEQ tablet Take 1 tablet (20 mEq total) by mouth daily. 01/30/22   Orson Eva, MD  thiamine (VITAMIN B1) 100 MG tablet Take 100 mg by mouth daily. 02/13/22   [provider]  zinc gluconate 50 MG tablet SMARTSIG:1 caplet By Mouth Daily 02/13/22   [provider]      Allergies    Amoxicillin, Allopurinol, Macrolides and ketolides, Mycinette [phenol], Nitrofurantoin, Simvastatin, Rosuvastatin, Triamcinolone, and Triamcinolone acetonide    Review of Systems   Review of Systems  Constitutional:  Negative for fever.  Respiratory:  Negative for cough.   Cardiovascular:  Negative for chest pain.  Gastrointestinal:  Positive for abdominal pain, diarrhea, nausea and vomiting. Negative for hematemesis.    Physical Exam Updated Vital Signs BP 131/88   Pulse 87   Resp 18   SpO2 100%  Physical Exam Vitals and nursing note reviewed.  Constitutional:      General: She is in acute distress.     Appearance: She is well-developed.  HENT:     Head: Normocephalic and atraumatic.  Eyes:     Conjunctiva/sclera: Conjunctivae normal.  Cardiovascular:     Rate and Rhythm: Normal rate and regular rhythm.     Heart sounds: No murmur heard. Pulmonary:     Effort: Pulmonary effort is normal. No respiratory distress.  Breath sounds: Normal breath sounds.  Abdominal:     Palpations: Abdomen is soft. There is no pulsatile mass.     Tenderness: There is generalized abdominal tenderness. There is no guarding or rebound.  Musculoskeletal:        General: No deformity. Normal range of motion.     Cervical back: Neck supple.  Skin:    General: Skin is warm and dry.     Capillary Refill: Capillary refill takes less than 2 seconds.  Neurological:     General: No focal deficit present.     Mental Status: She is alert.     ED Results / Procedures / Treatments   Labs (all  labs ordered are listed, but only abnormal results are displayed) Labs Reviewed - No data to display  EKG None  Radiology No results found.  Procedures Procedures  {Document cardiac monitor, telemetry assessment procedure when appropriate:1}  Medications Ordered in ED Medications  ondansetron (ZOFRAN) injection 4 mg (has no administration in time range)  morphine (PF) 4 MG/ML injection 4 mg (has no administration in time range)  pantoprazole (PROTONIX) injection 40 mg (has no administration in time range)  sodium chloride 0.9 % bolus 1,000 mL (has no administration in time range)    ED Course/ Medical Decision Making/ A&P                           Medical Decision Making Amount and/or Complexity of Data Reviewed Labs: ordered. Radiology: ordered.  Risk Prescription drug management.   This patient complains of ***; this involves an extensive number of treatment Options and is a complaint that carries with it a high risk of complications and morbidity. The differential includes ***  I ordered, reviewed and interpreted labs, which included *** I ordered medication *** and reviewed PMP when indicated. I ordered imaging studies which included *** and I independently    visualized and interpreted imaging which showed *** Additional history obtained from *** Previous records obtained and reviewed *** I consulted *** and discussed lab and imaging findings and discussed disposition.  Cardiac monitoring reviewed, *** Social determinants considered, *** Critical Interventions: ***  After the interventions stated above, I reevaluated the patient and found *** Admission and further testing considered, ***   {Document critical care time when appropriate:1} {Document review of labs and clinical decision tools ie heart score, Chads2Vasc2 etc:1}  {Document your independent review of radiology images, and any outside records:1} {Document your discussion with family members,  caretakers, and with consultants:1} {Document social determinants of health affecting pt's care:1} {Document your decision making why or why not admission, treatments were needed:1} Final Clinical Impression(s) / ED Diagnoses Final diagnoses:  None    Rx / DC Orders ED Discharge Orders     None

## 2022-03-06 NOTE — ED Notes (Signed)
Date and time results received: 03/06/22 1638 (use smartphrase ".now" to insert current time)  Test: Lactic Acid Critical Value: 3.6  Name of Provider Notified: Dr. Melina Copa  Orders Received? Or Actions Taken?: see chart

## 2022-03-06 NOTE — Progress Notes (Signed)
Elink following for sepsis protocol. 

## 2022-03-06 NOTE — H&P (Signed)
History and Physical    ARALYNN BRAKE KCL:[id number] DOB: Jul 26, 1950 DOA: 03/06/2022  PCP: Sonny Masters, FNP   Patient coming from: Home  I have personally briefly reviewed patient's old medical records in [facility]  Chief Complaint: Abdominal Pain  HPI: Maria Dunlap is a 71 y.o. female with medical history significant for alcohol abuse, liver cirrhosis, hypertension, prediabetes. Patient presented to the ED with complaints of abdominal pain started about 11 this morning, with 3 episodes of vomiting.  She also reports multiple loose stools yesterday.  She reports pain with urination that started today.  She reports she has not drunk any alcoholic beverage in at least 6 weeks.  Spouse is at bedside. Patient was also treated for UTI about 2-1/2 weeks ago, she completed 7 days of an antibiotic- ? Name.   Recent hospitalization 9/1 to 9/8 for hypernatremia- 151 and acute metabolic encephalopathy.  MRI brain CT negative for acute abnormality.  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  ED Course: Stable vitals.  Lipase 1643.  Mild elevation in liver enzymes - AST 130, ALT 31, ALP 178, total bilirubin 2.  Lactic acidosis 3.6 > 3.5.  Potassium 3.2.  Magnesium 1.5. CT abdomen and pelvis with contrast-patent abdominal vasculature, suggests acute pancreatitis, secondary duodenitis, cholelithiasis without acute cholecystitis, cystitis, and mildly dilated common duct, consider MRCP or ERCP. EDP talked to Dr. Marletta Lor, recommended consulting [facility]. EDP talked with Dr. Myrtie Neither on-call for GI, recommended patient stay here at [facility], patient likely passed stone, transferred to [facility] on Monday if ERCP MRCP still needed.  Review of Systems: As per HPI all other systems reviewed and negative.  Past Medical History:  Diagnosis Date   Alcohol  abuse    Dysphagia    Eczema    Essential hypertension    GERD (gastroesophageal reflux disease)    Gout    Hyperlipidemia    Liver cirrhosis (HCC)    Menopause    Metabolic encephalopathy    Prediabetes    Statin intolerance     Past Surgical History:  Procedure Laterality Date   ABDOMINAL HYSTERECTOMY     ABDOMINAL SURGERY       reports that she quit smoking about 29 years ago. Her smoking use included cigarettes. She does not have any smokeless tobacco history on file. She reports current alcohol use. She reports that she does not use drugs.  Allergies  Allergen Reactions   Amoxicillin Swelling    Neck swell Throat swelling   Allopurinol Other (See Comments)    Elevated LFT's Elevated LFT's   Macrolides And Ketolides    Mycinette [Phenol]    Nitrofurantoin Other (See Comments)   Simvastatin Other (See Comments)    Elevated LFT's  Elevated LFT's   Rosuvastatin Rash    dizziness   Triamcinolone Rash and Other (See Comments)   Triamcinolone Acetonide Rash    Family History  Problem Relation Age of Onset   Hypertension Mother    Heart disease Mother    Stroke Father    Heart disease Father    Cancer Brother    COPD Brother    Prostate cancer Brother     Prior to Admission medications   Medication Sig Start Date End Date Taking? Authorizing Provider  Ascorbic Acid (VITAMIN C WITH ROSE HIPS) 500 MG tablet Take 500 mg by mouth daily. 02/13/22   [provider]  Chelated Zinc 50 MG TABS Take 1 tablet by mouth daily. 02/12/22   [provider]  cyanocobalamin (VITAMIN B12) 500 MCG tablet Take by mouth. 01/19/22 01/19/23  [provider]  folic acid (FOLVITE) 400 MCG tablet Take 400 mcg by mouth daily. 02/13/22   [provider]  magnesium oxide (MAG-OX) 400 (240 Mg) MG tablet Take 1 tablet (400 mg total) by mouth 2 (two) times daily. 01/29/22   Catarina Hartshorn, MD  magnesium oxide (MAG-OX) 400 MG tablet Take 1 tablet by mouth daily.  02/12/22   [provider]  potassium chloride SA (KLOR-CON M) 20 MEQ tablet Take 1 tablet (20 mEq total) by mouth daily. 01/30/22   Catarina Hartshorn, MD  thiamine (VITAMIN B1) 100 MG tablet Take 100 mg by mouth daily. 02/13/22   [provider]  zinc gluconate 50 MG tablet SMARTSIG:1 caplet By Mouth Daily 02/13/22   [provider]    Physical Exam: Vitals:   03/06/22 1730 03/06/22 1800 03/06/22 1821 03/06/22 1830  BP: 136/80 128/81  114/67  Pulse: 79 86  84  Resp: 15 20  16   Temp:      TempSrc:      SpO2: 100% 100%  96%  Weight:   51.5 kg   Height:   5\' 1"  (1.549 m)     Constitutional: NAD, calm, comfortable Vitals:   03/06/22 1730 03/06/22 1800 03/06/22 1821 03/06/22 1830  BP: 136/80 128/81  114/67  Pulse: 79 86  84  Resp: 15 20  16   Temp:      TempSrc:      SpO2: 100% 100%  96%  Weight:   51.5 kg   Height:   5\' 1"  (1.549 m)    Eyes: PERRL, lids and conjunctivae normal ENMT: Mucous membranes are moist.   Neck: normal, supple, no masses, no thyromegaly Respiratory: clear to auscultation bilaterally, no wheezing, no crackles.  Cardiovascular: Regular rate and rhythm, no murmurs / rubs / gallops. No extremity edema. 2+ pedal pulses. No carotid bruits.  Abdomen: no tenderness, no masses palpated. No hepatosplenomegaly. Bowel sounds positive.  Musculoskeletal: no clubbing / cyanosis. No joint deformity upper and lower extremities. Good ROM, no contractures. Normal muscle tone.  Skin: Multiple small purpuric lesions to bilateral lower extremities extending up to thighs-spouse reports these are chronic and have been present for several weeks, abrasion to left knee area from fall.  Tips of bilateral toes are bruised with purpura worse on the left, likely from trauma, ?  Footwear? .... Neurologic: No facial asymmetry, speech fluent without evidence of aphasia, moving all extremities spontaneously. Psychiatric: Awake and alert able to answer questions appropriately.    Some baseline cognitive deficit.  Labs on Admission: I have personally reviewed following labs and imaging studies  CBC: Recent Labs  Lab 03/06/22 1530  WBC 10.4  NEUTROABS 8.0*  HGB 10.5*  HCT 32.3*  MCV 95.6  PLT 263   Basic Metabolic Panel: Recent Labs  Lab 03/06/22 1530  NA 136  K 3.2*  CL 106  CO2 18*  GLUCOSE 150*  BUN 11  CREATININE 0.78  CALCIUM 9.0  MG 1.5*   GFR: Estimated Creatinine Clearance: 47.3 mL/min (by C-G formula based on SCr of 0.78 mg/dL). Liver Function Tests: Recent Labs  Lab 03/06/22 1530  AST 130*  ALT 31  ALKPHOS 178*  BILITOT 2.0*  PROT 6.1*  ALBUMIN 2.7*   Recent Labs  Lab 03/06/22 1530  LIPASE 1,643*  Urine analysis:    Component Value Date/Time   COLORURINE YELLOW 03/06/2022 1625   APPEARANCEUR HAZY (A) 03/06/2022 1625   APPEARANCEUR Clear 02/18/2022 1039   LABSPEC 1.016 03/06/2022 1625   PHURINE 5.0 03/06/2022 1625   GLUCOSEU NEGATIVE 03/06/2022 1625   HGBUR NEGATIVE 03/06/2022 1625   BILIRUBINUR NEGATIVE 03/06/2022 1625   BILIRUBINUR Negative 02/18/2022 1039   KETONESUR 5 (A) 03/06/2022 1625   PROTEINUR NEGATIVE 03/06/2022 1625   UROBILINOGEN negative 08/13/2013 1218   NITRITE NEGATIVE 03/06/2022 1625   LEUKOCYTESUR LARGE (A) 03/06/2022 1625    Radiological Exams on Admission: CT Angio Abd/Pel W and/or Wo Contrast  Result Date: 03/06/2022 CLINICAL DATA:  Abdominal pain, diarrhea, evaluate for mesenteric ischemia EXAM: CTA ABDOMEN AND PELVIS WITHOUT AND WITH CONTRAST TECHNIQUE: Multidetector CT imaging of the abdomen and pelvis was performed using the standard protocol during bolus administration of intravenous contrast. Multiplanar reconstructed images and MIPs were obtained and reviewed to evaluate the vascular anatomy. RADIATION DOSE REDUCTION: This exam was performed according to the departmental dose-optimization program which includes automated exposure control, adjustment of the mA and/or kV according to  patient size and/or use of iterative reconstruction technique. CONTRAST:  8mL OMNIPAQUE IOHEXOL 350 MG/ML SOLN COMPARISON:  CT abdomen/pelvis dated 01/09/2022 FINDINGS: VASCULAR Aorta: No evidence of abdominal aortic aneurysm. Patent. Atherosclerotic calcifications. Celiac: Patent. SMA: Patent. Renals: Patent bilaterally. Atherosclerotic calcifications at the origin bilaterally. IMA: Patent. Inflow: Patent bilaterally. Atherosclerotic calcifications. Proximal Outflow: Patent bilaterally. Atherosclerotic calcifications. Veins: Within normal limits. Review of the MIP images confirms the above findings. NON-VASCULAR Lower chest: Lung bases are clear. Hepatobiliary: Liver is within normal limits. Suspected layering small gallstones versus gallbladder sludge (series 4/image 59). No convincing pericholecystic inflammatory changes. No intrahepatic ductal dilatation. Mildly dilated common duct, measuring 12 mm (series 9/image 57). Pancreas: Peripancreatic inflammatory changes along the pancreatic head since uncinate process and pancreaticoduodenal groove, suggesting acute pancreatitis. No drainable fluid collection/walled-off necrosis. Spleen: Within normal limits. Adrenals/Urinary Tract: Adrenal glands are within normal limits. 7 mm cyst in the lateral interpolar right kidney (series 11/image 33). Left kidney is within normal limits. No hydronephrosis. Thick-walled bladder with asymmetric wall thickening on the right (series 11/image 74), new from the prior. Stomach/Bowel: Stomach is within normal limits. No evidence of bowel obstruction. Inflammatory changes along the duodenum, suggesting duodenitis, although favored to be secondary. Normal appendix (series 11/image 85). Scattered mild right colonic diverticulosis, without evidence of diverticulitis. No colonic wall thickening or inflammatory changes. No pneumatosis or free air. Lymphatic: No suspicious abdominopelvic lymphadenopathy. Reproductive: Status post  hysterectomy. No adnexal masses. Other: No abdominopelvic ascites. Musculoskeletal: Mild lumbar dextroscoliosis with mild degenerative changes. IMPRESSION: Patent abdominal vasculature. No findings suspicious for mesenteric ischemia. Peripancreatic inflammatory changes/fluid, suggesting acute pancreatitis. No associated complication. Suspected secondary duodenitis. Cholelithiasis, without associated findings to suggest acute cholecystitis. Mildly dilated common duct, measuring 12 mm. Correlate with LFTs and consider ERCP or MRCP to exclude choledocholithiasis as clinically warranted. Asymmetric bladder wall thickening, new, correlate for cystitis. Electronically Signed   By: Julian Hy M.D.   On: 03/06/2022 18:49   DG Chest Port 1 View  Result Date: 03/06/2022 CLINICAL DATA:  Upper abdominal pain EXAM: PORTABLE CHEST 1 VIEW COMPARISON:  01/22/2022 FINDINGS: The heart size and mediastinal contours are within normal limits. Both lungs are clear. The visualized skeletal structures are unremarkable. IMPRESSION: No acute abnormality of the lungs in AP portable projection. Electronically Signed   By: Delanna Ahmadi M.D.   On: 03/06/2022 15:49  EKG: Independently reviewed.  Sinus rhythm, rate 83, QTc prolonged 536.  Assessment/Plan Principal Problem:   Acute gallstone pancreatitis Active Problems:   Hypokalemia   Prediabetes   Cirrhosis of liver without ascites (HCC)   Prolonged QT interval   UTI (urinary tract infection)   Assessment and Plan: * Acute gallstone pancreatitis Presenting with abdominal pain vomiting and diarrhea yesterday.  Lipase elevated at 1643, minimal liver enzyme elevation, AST 130, and ALP of 178, total bilirubin 2.  ALT WNL- 31.  CT findings-just an acute pancreatitis, secondary to duodenitis.  No associated complication.  Also shows cholelithiasis, mildly dilated common bile duct- consider ERCP or MRCP to exclude choledocholithiasis as clinically warranted. -  EDP  talked to Dr. Marletta Lor, recommended consulting [facility]. EDP talked with Dr. Myrtie Neither on-call for GI, recommended patient stay here at [facility], patient likely passed stone, transfer to [facility] on Monday-10/16 if ERCP MRCP still needed -N.p.o. -IV morphine 4mg  q4hr prn -2 L bolus given continue LR at 125cc/hr  -GI consult in a.m. -CBG every 8 hourly while n.p.o. -CMP, CBC in a.m. -Trend lactic acid   UTI (urinary tract infection) Reports dysuria today, rule out for sepsis, afebrile, WBC 10.4.  Lactic acidosis of 3.6 > 3.5 likely from dehydration, pancreatitis,  underlying liver cirrhosis and likely poor clearance also.  Reports completing 7 day course of antibiotics about 2-1/2 weeks ago- ?  name antibiotic.  Recent urine cultures growing greater than 100,000 colonies of yeast. -Follow-up urine cultures -Continue with IV ceftriaxone 1 g daily -Hold broad-spectrum antibiotics  Prolonged QT interval QTc 536.  Not on QT prolonging medications. -Replete lites.  Cirrhosis of liver without ascites (HCC) History of alcohol abuse.  Last alcoholic beverage 6 weeks ago at least.  Has several small purpura to bilateral lower extremities.  Platelets within normal at 263. -Check PT/INR    Prediabetes - hgba1c  Hypokalemia Potassium 3.2.  Also hypomagnesemia 1.5.  Likely from vomiting and diarrhea. -Replete lites   DVT prophylaxis: Lovenox Code Status: Full code Family Communication: Spouse at bedside Disposition Plan: ~/> 2 days Consults called: GI Admission status: Inpatient, telemetry I certify that at the point of admission it is my clinical judgment that the patient will require inpatient hospital care spanning beyond 2 midnights from the point of admission due to high intensity of service, high risk for further deterioration and high frequency of surveillance required.    Author: , MD 03/06/2022 9:11 PM  For on call review [email or web address].

## 2022-03-06 NOTE — Assessment & Plan Note (Addendum)
Reports dysuria today, rule out for sepsis, afebrile, WBC 10.4.  Lactic acidosis of 3.6 > 3.5 likely from dehydration, pancreatitis,  underlying liver cirrhosis and likely poor clearance also.  Reports completing 7 day course of antibiotics about 2-1/2 weeks ago.  Recent urine cultures growing greater than 100,000 colonies of yeast. -Follow-up urine cultures -Continue with IV ceftriaxone 1 g daily x 3 doses

## 2022-03-07 DIAGNOSIS — R7303 Prediabetes: Secondary | ICD-10-CM

## 2022-03-07 DIAGNOSIS — K759 Inflammatory liver disease, unspecified: Secondary | ICD-10-CM | POA: Diagnosis present

## 2022-03-07 DIAGNOSIS — K7030 Alcoholic cirrhosis of liver without ascites: Secondary | ICD-10-CM | POA: Diagnosis not present

## 2022-03-07 DIAGNOSIS — K851 Biliary acute pancreatitis without necrosis or infection: Secondary | ICD-10-CM | POA: Diagnosis not present

## 2022-03-07 DIAGNOSIS — D649 Anemia, unspecified: Secondary | ICD-10-CM

## 2022-03-07 DIAGNOSIS — E876 Hypokalemia: Secondary | ICD-10-CM | POA: Diagnosis not present

## 2022-03-07 DIAGNOSIS — R9431 Abnormal electrocardiogram [ECG] [EKG]: Secondary | ICD-10-CM | POA: Diagnosis not present

## 2022-03-07 LAB — COMPREHENSIVE METABOLIC PANEL
ALT: 27 U/L (ref 0–44)
AST: 62 U/L — ABNORMAL HIGH (ref 15–41)
Albumin: 2.2 g/dL — ABNORMAL LOW (ref 3.5–5.0)
Alkaline Phosphatase: 146 U/L — ABNORMAL HIGH (ref 38–126)
Anion gap: 7 (ref 5–15)
BUN: 11 mg/dL (ref 8–23)
CO2: 20 mmol/L — ABNORMAL LOW (ref 22–32)
Calcium: 8.4 mg/dL — ABNORMAL LOW (ref 8.9–10.3)
Chloride: 109 mmol/L (ref 98–111)
Creatinine, Ser: 0.59 mg/dL (ref 0.44–1.00)
GFR, Estimated: >60 mL/min (ref >60)
Glucose, Bld: 76 mg/dL (ref 70–99)
Potassium: 3.9 mmol/L (ref 3.5–5.1)
Sodium: 136 mmol/L (ref 135–145)
Total Bilirubin: 1.5 mg/dL — ABNORMAL HIGH (ref 0.3–1.2)
Total Protein: 4.9 g/dL — ABNORMAL LOW (ref 6.5–8.1)

## 2022-03-07 LAB — GLUCOSE, CAPILLARY: Glucose-Capillary: 91 mg/dL (ref 70–99)

## 2022-03-07 LAB — CBC
HCT: 28.3 % — ABNORMAL LOW (ref 36.0–46.0)
Hemoglobin: 8.8 g/dL — ABNORMAL LOW (ref 12.0–15.0)
MCH: 30.6 pg (ref 26.0–34.0)
MCHC: 31.1 g/dL (ref 30.0–36.0)
MCV: 98.3 fL (ref 80.0–100.0)
Platelets: 200 10*3/uL (ref 150–400)
RBC: 2.88 MIL/uL — ABNORMAL LOW (ref 3.87–5.11)
RDW: 14.4 % (ref 11.5–15.5)
WBC: 7.1 10*3/uL (ref 4.0–10.5)
nRBC: 0.3 % — ABNORMAL HIGH (ref 0.0–0.2)

## 2022-03-07 LAB — HEMOGLOBIN A1C
Hgb A1c MFr Bld: 4 % — ABNORMAL LOW (ref 4.8–5.6)
Mean Plasma Glucose: 68.1 mg/dL

## 2022-03-07 LAB — MAGNESIUM: Magnesium: 2.2 mg/dL (ref 1.7–2.4)

## 2022-03-07 LAB — LIPASE, BLOOD: Lipase: 261 U/L — ABNORMAL HIGH (ref 11–51)

## 2022-03-07 MED ORDER — MORPHINE SULFATE (PF) 2 MG/ML IV SOLN
1.0000 mg | INTRAVENOUS | Status: DC | PRN
  Administered 2022-03-08: 1 mg via INTRAVENOUS
  Filled 2022-03-07: qty 1

## 2022-03-07 NOTE — Progress Notes (Signed)
Patient arrived to floor. Patient is alert but confused, which is patient's baseline according to family.  Patient has had no nausea or vomiting this shift.  Patient has not complained of pain this shift.  Patient has voided x 3 this shift, urine is amber colored.

## 2022-03-07 NOTE — Hospital Course (Signed)
71 y.o. female with medical history significant for alcohol abuse, liver cirrhosis, hypertension, prediabetes.  Patient presented to the ED with complaints of abdominal pain started about 11 this morning, with 3 episodes of vomiting.  She also reports multiple loose stools yesterday.  She reports pain with urination that started today.  She reports she has not drunk any alcoholic beverage in at least 6 weeks.  Spouse is at bedside.  Patient was also treated for UTI about 2-1/2 weeks ago, she completed 7 days of an antibiotic- ? Name.    Recent hospitalization 9/1 to 9/8 for hypernatremia- 151 and acute metabolic encephalopathy.  MRI brain CT negative for acute abnormality.  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   ED Course: Stable vitals.  Lipase 1643.  Mild elevation in liver enzymes - AST 130, ALT 31, ALP 178, total bilirubin 2.  Lactic acidosis 3.6 > 3.5.  Potassium 3.2.  Magnesium 1.5.  CT abdomen and pelvis with contrast-patent abdominal vasculature, suggests acute pancreatitis, secondary duodenitis, cholelithiasis without acute cholecystitis, cystitis, and mildly dilated common duct, consider MRCP or ERCP.  EDP talked to Dr. Abbey Chatters, recommended consulting [facility]. EDP talked with Dr. Loletha Carrow on-call for GI, recommended patient stay here at [facility], patient likely passed stone, transferred to [facility] on Monday if ERCP MRCP still needed.

## 2022-03-07 NOTE — Progress Notes (Signed)
PROGRESS NOTE   Maria Dunlap  AYT:[id number] DOB: 04-Aug-1950 DOA: 03/06/2022 PCP: Baruch Gouty, FNP   Chief Complaint  Patient presents with   Abdominal Pain   Level of care: Telemetry  Brief Admission History:  71 y.o. female with medical history significant for alcohol abuse, liver cirrhosis, hypertension, prediabetes.  Patient presented to the ED with complaints of abdominal pain started about 11 this morning, with 3 episodes of vomiting.  She also reports multiple loose stools yesterday.  She reports pain with urination that started today.  She reports she has not drunk any alcoholic beverage in at least 6 weeks.  Spouse is at bedside.  Patient was also treated for UTI about 2-1/2 weeks ago, she completed 7 days of an antibiotic- ? Name.    Recent hospitalization 9/1 to 9/8 for hypernatremia- 151 and acute metabolic encephalopathy.  MRI brain CT negative for acute abnormality.  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   ED Course: Stable vitals.  Lipase 1643.  Mild elevation in liver enzymes - AST 130, ALT 31, ALP 178, total bilirubin 2.  Lactic acidosis 3.6 > 3.5.  Potassium 3.2.  Magnesium 1.5.  CT abdomen and pelvis with contrast-patent abdominal vasculature, suggests acute pancreatitis, secondary duodenitis, cholelithiasis without acute cholecystitis, cystitis, and mildly dilated common duct, consider MRCP or ERCP.  EDP talked to Dr. Abbey Chatters, recommended consulting [facility]. EDP talked with Dr. Loletha Carrow on-call for GI, recommended patient stay here at [facility], patient likely passed stone, transferred to [facility] on Monday if ERCP MRCP still needed.   Assessment and Plan: * Acute gallstone pancreatitis Presenting with abdominal pain vomiting and diarrhea yesterday.  Lipase elevated at 1643, minimal liver enzyme elevation, AST 130, and ALP of  178, total bilirubin 2.  ALT WNL- 31.  CT findings-just an acute pancreatitis, secondary to duodenitis.  No associated complication.  Also shows cholelithiasis, mildly dilated common bile duct- consider ERCP or MRCP to exclude choledocholithiasis as clinically warranted. -  EDP talked to Dr. Abbey Chatters, recommended consulting [facility]. EDP talked with Dr. Loletha Carrow on-call for GI, recommended patient stay here at [facility], patient likely passed stone, transfer to [facility] on Monday-10/16 if ERCP MRCP still needed -N.p.o. pending GI eval  -IV morphine prn pain -IV hydration  -GI consult in a.m. -CMP, CBC in a.m.   UTI (urinary tract infection) Reports dysuria today, rule out for sepsis, afebrile, WBC 10.4.  Lactic acidosis of 3.6 > 3.5 likely from dehydration, pancreatitis,  underlying liver cirrhosis and likely poor clearance also.  Reports completing 7 day course of antibiotics about 2-1/2 weeks ago.  Recent urine cultures growing greater than 100,000 colonies of yeast. -Follow-up urine cultures -Continue with IV ceftriaxone 1 g daily x 3 doses  Prolonged QT interval QTc 536.  Not on QT prolonging medications. -Repleted electrolytes  Cirrhosis of liver without ascites (HCC) History of alcohol abuse.  Last alcoholic beverage 6 weeks ago at least.  Has several small purpura to bilateral lower extremities.  Platelets within normal at 263. - PT/INR WNL    Prediabetes - hgba1c pending  Hypokalemia Potassium 3.2.  Also hypomagnesemia 1.5.  Likely from vomiting and diarrhea. -Repleted   DVT prophylaxis: enoxaparin Code Status: Full  Family Communication: husband bedside  Disposition: Status is: Inpatient Remains inpatient appropriate because: IV fluids required    Consultants:  GI  Procedures:   Antimicrobials:  Ceftriaxone 10/14>>  Subjective: Pt reports abdominal pain is a little better this morning.  Objective: Vitals:   03/06/22 2000 03/06/22 2104 03/07/22 0529 03/07/22 0921   BP: 124/79 101/78 113/75 121/70  Pulse: 74 78 72 65  Resp: 15 19 18 18   Temp: 98 F (36.7 C) 97.7 F (36.5 C) 98.4 F (36.9 C) 98 F (36.7 C)  TempSrc: Oral   Oral  SpO2: 100% 100% 100% 99%  Weight:      Height:        Intake/Output Summary (Last 24 hours) at 03/07/2022 1149 Last data filed at 03/07/2022 0600 Gross per 24 hour  Intake 2480 ml  Output --  Net 2480 ml   Filed Weights   03/06/22 1821  Weight: 51.5 kg   Examination:  General exam: elderly frail female, awake, alert, confused, Appears calm and comfortable  Respiratory system: Clear to auscultation. Respiratory effort normal. Cardiovascular system: normal S1 & S2 heard. No JVD, murmurs, rubs, gallops or clicks. No pedal edema. Gastrointestinal system: Abdomen is nondistended, soft and mild epigastric tenderness. No organomegaly or masses felt. Normal bowel sounds heard. Central nervous system: Alert and oriented. No focal neurological deficits. Extremities: Symmetric 5 x 5 power. Skin: No rashes, lesions or ulcers. Psychiatry: Judgement and insight appear poor. Mood & affect appropriate.   Data Reviewed: I have personally reviewed following labs and imaging studies  CBC: Recent Labs  Lab 03/06/22 1530 03/07/22 0451  WBC 10.4 7.1  NEUTROABS 8.0*  --   HGB 10.5* 8.8*  HCT 32.3* 28.3*  MCV 95.6 98.3  PLT 263 200    Basic Metabolic Panel: Recent Labs  Lab 03/06/22 1530 03/07/22 0451  NA 136 136  K 3.2* 3.9  CL 106 109  CO2 18* 20*  GLUCOSE 150* 76  BUN 11 11  CREATININE 0.78 0.59  CALCIUM 9.0 8.4*  MG 1.5* 2.2    CBG: Recent Labs  Lab 03/07/22 0045  GLUCAP 91    Recent Results (from the past 240 hour(s))  Culture, blood (single)     Status: None (Preliminary result)   Collection Time: 03/06/22  4:49 PM   Specimen: BLOOD  Result Value Ref Range Status   Specimen Description   Final    BLOOD RIGHT ANTECUBITAL BOTTLES DRAWN AEROBIC AND ANAEROBIC   Special Requests   Final     Blood Culture results may not be optimal due to an excessive volume of blood received in culture bottles Performed at [facility], [address], [place], [place]    Culture PENDING  Incomplete   Report Status PENDING  Incomplete     Radiology Studies: CT Angio Abd/Pel W and/or Wo Contrast  Result Date: 03/06/2022 CLINICAL DATA:  Abdominal pain, diarrhea, evaluate for mesenteric ischemia EXAM: CTA ABDOMEN AND PELVIS WITHOUT AND WITH CONTRAST TECHNIQUE: Multidetector CT imaging of the abdomen and pelvis was performed using the standard protocol during bolus administration of intravenous contrast. Multiplanar reconstructed images and MIPs were obtained and reviewed to evaluate the vascular anatomy. RADIATION DOSE REDUCTION: This exam was performed according to the departmental dose-optimization program which includes automated exposure control, adjustment of the mA and/or kV according to patient size and/or use of iterative reconstruction technique. CONTRAST:  67mL OMNIPAQUE IOHEXOL 350 MG/ML SOLN COMPARISON:  CT abdomen/pelvis dated 01/09/2022 FINDINGS: VASCULAR Aorta: No evidence of abdominal aortic aneurysm. Patent. Atherosclerotic calcifications. Celiac: Patent. SMA: Patent. Renals: Patent bilaterally. Atherosclerotic calcifications at the origin bilaterally. IMA: Patent. Inflow: Patent bilaterally. Atherosclerotic calcifications.  Proximal Outflow: Patent bilaterally. Atherosclerotic calcifications. Veins: Within normal limits. Review of the MIP images confirms the above findings. NON-VASCULAR Lower chest: Lung bases are clear. Hepatobiliary: Liver is within normal limits. Suspected layering small gallstones versus gallbladder sludge (series 4/image 59). No convincing pericholecystic inflammatory changes. No intrahepatic ductal dilatation. Mildly dilated common duct, measuring 12 mm (series 9/image 57). Pancreas: Peripancreatic inflammatory changes along the pancreatic head since uncinate  process and pancreaticoduodenal groove, suggesting acute pancreatitis. No drainable fluid collection/walled-off necrosis. Spleen: Within normal limits. Adrenals/Urinary Tract: Adrenal glands are within normal limits. 7 mm cyst in the lateral interpolar right kidney (series 11/image 33). Left kidney is within normal limits. No hydronephrosis. Thick-walled bladder with asymmetric wall thickening on the right (series 11/image 74), new from the prior. Stomach/Bowel: Stomach is within normal limits. No evidence of bowel obstruction. Inflammatory changes along the duodenum, suggesting duodenitis, although favored to be secondary. Normal appendix (series 11/image 85). Scattered mild right colonic diverticulosis, without evidence of diverticulitis. No colonic wall thickening or inflammatory changes. No pneumatosis or free air. Lymphatic: No suspicious abdominopelvic lymphadenopathy. Reproductive: Status post hysterectomy. No adnexal masses. Other: No abdominopelvic ascites. Musculoskeletal: Mild lumbar dextroscoliosis with mild degenerative changes. IMPRESSION: Patent abdominal vasculature. No findings suspicious for mesenteric ischemia. Peripancreatic inflammatory changes/fluid, suggesting acute pancreatitis. No associated complication. Suspected secondary duodenitis. Cholelithiasis, without associated findings to suggest acute cholecystitis. Mildly dilated common duct, measuring 12 mm. Correlate with LFTs and consider ERCP or MRCP to exclude choledocholithiasis as clinically warranted. Asymmetric bladder wall thickening, new, correlate for cystitis. Electronically Signed   By: Charline Bills M.D.   On: 03/06/2022 18:49   DG Chest Port 1 View  Result Date: 03/06/2022 CLINICAL DATA:  Upper abdominal pain EXAM: PORTABLE CHEST 1 VIEW COMPARISON:  01/22/2022 FINDINGS: The heart size and mediastinal contours are within normal limits. Both lungs are clear. The visualized skeletal structures are unremarkable. IMPRESSION:  No acute abnormality of the lungs in AP portable projection. Electronically Signed   By: Jearld Lesch M.D.   On: 03/06/2022 15:49    Scheduled Meds:  enoxaparin (LOVENOX) injection  40 mg Subcutaneous Q24H   Continuous Infusions:  cefTRIAXone (ROCEPHIN)  IV 1 g (03/07/22 0448)   lactated ringers       LOS: 1 day   Time spent: 36 mins  Kairee Isa Laural Benes, MD How to contact the [facility] Attending or Consulting provider 7A - 7P or covering provider during after hours 7P -7A, for this patient?  Check the care team in [facility] and look for a) attending/consulting [facility] provider listed and b) the [facility] team listed Log into [email or web address] and use [facility]'s universal password to access. If you do not have the password, please contact the hospital operator. Locate the [facility] provider you are looking for under [facility] and page to a number that you can be directly reached. If you still have difficulty reaching the provider, please page the [facility] (Director on Call) for the Hospitalists listed on amion for assistance.  03/07/2022, 11:49 AM

## 2022-03-07 NOTE — Consult Note (Signed)
Consulting  Provider: Dr. Melina Copa Primary Care Physician:  Baruch Gouty, FNP Primary Gastroenterologist: Previously unassigned, Dr. Abbey Chatters  Reason for Consultation: Gallstone pancreatitis  HPI:  Maria Dunlap is a 71 y.o. female with a past medical history of chronic alcohol use, cirrhosis, hypertension, diabetes, who presented to [facility] ER yesterday evening with chief complaint of right upper quadrant abdominal pain as well as nausea and vomiting.  States her pain started yesterday, moderate to severe in severity, right upper quadrant, does not radiate.  Does note multiple loose stools though has been taking lactulose for recently diagnosed cirrhosis by imaging.  In the ER, blood work showed lipase 1643, AST 130, ALT 31, T. bili 2, hemoglobin 10.5 down to 8.8 today.  Subsequent CT abdomen pelvis which I personally reviewed showed peripancreatic inflammatory changes suggestive of acute pancreatitis, mildly dilated CBD 12 mm, cholelithiasis without cholecystitis.  Case was discussed with [facility] who recommended patient stay at our facility for MRCP to further evaluate.  This morning her LFTs are improved.  Lipase also improved to 261.  In regards to her cirrhosis, this appears to be well compensated, MELD 8.  This was diagnosed by imaging on CT abdomen pelvis without contrast 01/09/2022 showed slight nodular hepatic contour suggestive of cirrhosis.  Does not see GI in wishes to follow-up outpatient.  History of chronic alcohol abuse in the past though states she is stopped drinking approximately 6 weeks ago.  Admission to hospital 9/1 through 9/8 for hypernatremia and metabolic encephalopathy thought to be due to [place] improved with high-dose IV thiamine.  Past Medical History:  Diagnosis Date   Alcohol abuse    Dysphagia    Eczema    Essential hypertension    GERD (gastroesophageal reflux disease)    Gout    Hyperlipidemia    Liver cirrhosis (HCC)    Menopause     Metabolic encephalopathy    Prediabetes    Statin intolerance     Past Surgical History:  Procedure Laterality Date   ABDOMINAL HYSTERECTOMY     ABDOMINAL SURGERY      Prior to Admission medications   Medication Sig Start Date End Date Taking? Authorizing Provider  Ascorbic Acid (VITAMIN C WITH ROSE HIPS) 500 MG tablet Take 500 mg by mouth daily. 02/13/22  Yes [provider]  Chelated Zinc 50 MG TABS Take 50 mg by mouth daily. 02/12/22  Yes [provider]  cyanocobalamin (VITAMIN B12) 500 MCG tablet Take 500 mcg by mouth every other day. 01/19/22 01/19/23 Yes [provider]  folic acid (FOLVITE) 941 MCG tablet Take 400 mcg by mouth daily. 02/13/22  Yes [provider]  magnesium oxide (MAG-OX) 400 MG tablet Take 400 mg by mouth daily. 02/12/22  Yes [provider]  potassium chloride SA (KLOR-CON M) 20 MEQ tablet Take 1 tablet (20 mEq total) by mouth daily. 01/30/22  Yes Tat, Shanon Brow, MD  thiamine (VITAMIN B1) 100 MG tablet Take 100 mg by mouth daily. 02/13/22  Yes [provider]    Current Facility-Administered Medications  Medication Dose Route Frequency Provider Last Rate Last Admin   acetaminophen (TYLENOL) tablet 650 mg  650 mg Oral Q6H PRN Emokpae, Ejiroghene E, MD       Or   acetaminophen (TYLENOL) suppository 650 mg  650 mg Rectal Q6H PRN Emokpae, Ejiroghene E, MD       cefTRIAXone (ROCEPHIN) 1 g in sodium chloride 0.9 % 100 mL IVPB  1 g Intravenous Q24H  Emokpae, Ejiroghene E, MD 200 mL/hr at 03/07/22 0448 1 g at 03/07/22 0448   enoxaparin (LOVENOX) injection 40 mg  40 mg Subcutaneous Q24H Emokpae, Ejiroghene E, MD   40 mg at 03/06/22 2200   lactated ringers infusion   Intravenous Continuous Emokpae, Ejiroghene E, MD       morphine (PF) 2 MG/ML injection 1 mg  1 mg Intravenous Q4H PRN Johnson, Clanford L, MD       polyethylene glycol (MIRALAX / GLYCOLAX) packet 17 g  17 g Oral Daily PRN Emokpae, Ejiroghene E, MD        promethazine (PHENERGAN) tablet 12.5 mg  12.5 mg Oral Q6H PRN Emokpae, Ejiroghene E, MD        Allergies as of 03/06/2022 - Review Complete 03/06/2022  Allergen Reaction Noted   Amoxicillin Swelling 08/23/2012   Allopurinol Other (See Comments) 06/09/2021   Macrolides and ketolides     Mycinette [phenol]  08/23/2012   Nitrofurantoin Other (See Comments) 06/09/2021   Simvastatin Other (See Comments) 06/09/2021   Rosuvastatin Rash 08/23/2012   Triamcinolone Rash and Other (See Comments) 05/06/2020   Triamcinolone acetonide Rash 05/06/2020    Family History  Problem Relation Age of Onset   Hypertension Mother    Heart disease Mother    Stroke Father    Heart disease Father    Cancer Brother    COPD Brother    Prostate cancer Brother     Social History   Socioeconomic History   Marital status: Married    Spouse name: Not on file   Number of children: Not on file   Years of education: Not on file   Highest education level: Not on file  Occupational History   Not on file  Tobacco Use   Smoking status: Former    Types: Cigarettes    Quit date: 05/24/1992    Years since quitting: 29.8   Smokeless tobacco: Not on file  Substance and Sexual Activity   Alcohol use: Yes    Comment: Occasional   Drug use: No   Sexual activity: Not on file  Other Topics Concern   Not on file  Social History Narrative   Not on file   Social Determinants of Health   Financial Resource Strain: Not on file  Food Insecurity: No Food Insecurity (03/07/2022)   Hunger Vital Sign    Worried About Running Out of Food in the Last Year: Never true    Ran Out of Food in the Last Year: Never true  Transportation Needs: No Transportation Needs (03/07/2022)   PRAPARE - Administrator, Civil Service (Medical): No    Lack of Transportation (Non-Medical): No  Physical Activity: Not on file  Stress: Not on file  Social Connections: Not on file  Intimate Partner Violence: Not At Risk  (03/07/2022)   Humiliation, Afraid, Rape, and Kick questionnaire    Fear of Current or Ex-Partner: No    Emotionally Abused: No    Physically Abused: No    Sexually Abused: No    Review of Systems: General: Negative for anorexia, weight loss, fever, chills, fatigue, weakness. Eyes: Negative for vision changes.  ENT: Negative for hoarseness, difficulty swallowing , nasal congestion. CV: Negative for chest pain, angina, palpitations, dyspnea on exertion, peripheral edema.  Respiratory: Negative for dyspnea at rest, dyspnea on exertion, cough, sputum, wheezing.  GI: See history of present illness. GU:  Negative for dysuria, hematuria, urinary incontinence, urinary frequency, nocturnal urination.  MS: Negative for  joint pain, low back pain.  Derm: Negative for rash or itching.  Neuro: Negative for weakness, abnormal sensation, seizure, frequent headaches, memory loss, confusion.  Psych: Negative for anxiety, depression Endo: Negative for unusual weight change.  Heme: Negative for bruising or bleeding. Allergy: Negative for rash or hives.  Physical Exam: Vital signs in last 24 hours: Temp:  [97.7 F (36.5 C)-98.4 F (36.9 C)] 98 F (36.7 C) (10/15 0921) Pulse Rate:  [65-87] 65 (10/15 0921) Resp:  [13-32] 18 (10/15 0921) BP: (101-158)/(67-93) 121/70 (10/15 0921) SpO2:  [91 %-100 %] 99 % (10/15 0921) Weight:  [51.5 kg] 51.5 kg (10/14 1821)   General:   Alert,  Well-developed, well-nourished, pleasant and cooperative in NAD Head:  Normocephalic and atraumatic. Eyes:  Sclera clear, no icterus.   Conjunctiva pink. Ears:  Normal auditory acuity. Nose:  No deformity, discharge,  or lesions. Mouth:  No deformity or lesions, dentition normal. Neck:  Supple; no masses or thyromegaly. Lungs:  Clear throughout to auscultation.   No wheezes, crackles, or rhonchi. No acute distress. Heart:  Regular rate and rhythm; no murmurs, clicks, rubs,  or gallops. Abdomen:  Soft, nontender and  nondistended. No masses, hepatosplenomegaly or hernias noted. Normal bowel sounds, without guarding, and without rebound.   Msk:  Symmetrical without gross deformities. Normal posture. Pulses:  Normal pulses noted. Extremities:  Without clubbing or edema. Neurologic:  Alert and  oriented x4;  grossly normal neurologically. Skin:  Intact without significant lesions or rashes. Cervical Nodes:  No significant cervical adenopathy. Psych:  Alert and cooperative. Normal mood and affect.  Intake/Output from previous day: 10/14 0701 - 10/15 0700 In: 2480 [I.V.:750; IV Piggyback:1730] Out: -  Intake/Output this shift: No intake/output data recorded.  Lab Results: Recent Labs    03/06/22 1530 03/07/22 0451  WBC 10.4 7.1  HGB 10.5* 8.8*  HCT 32.3* 28.3*  PLT 263 200   BMET Recent Labs    03/06/22 1530 03/07/22 0451  NA 136 136  K 3.2* 3.9  CL 106 109  CO2 18* 20*  GLUCOSE 150* 76  BUN 11 11  CREATININE 0.78 0.59  CALCIUM 9.0 8.4*   LFT Recent Labs    03/06/22 1530 03/07/22 0451  PROT 6.1* 4.9*  ALBUMIN 2.7* 2.2*  AST 130* 62*  ALT 31 27  ALKPHOS 178* 146*  BILITOT 2.0* 1.5*   PT/INR Recent Labs    03/06/22 1553  LABPROT 12.8  INR 1.0   Hepatitis Panel No results for input(s): "HEPBSAG", "HCVAB", "HEPAIGM", "HEPBIGM" in the last 72 hours. C-Diff No results for input(s): "CDIFFTOX" in the last 72 hours.  Studies/Results: CT Angio Abd/Pel W and/or Wo Contrast  Result Date: 03/06/2022 CLINICAL DATA:  Abdominal pain, diarrhea, evaluate for mesenteric ischemia EXAM: CTA ABDOMEN AND PELVIS WITHOUT AND WITH CONTRAST TECHNIQUE: Multidetector CT imaging of the abdomen and pelvis was performed using the standard protocol during bolus administration of intravenous contrast. Multiplanar reconstructed images and MIPs were obtained and reviewed to evaluate the vascular anatomy. RADIATION DOSE REDUCTION: This exam was performed according to the departmental dose-optimization  program which includes automated exposure control, adjustment of the mA and/or kV according to patient size and/or use of iterative reconstruction technique. CONTRAST:  56mL OMNIPAQUE IOHEXOL 350 MG/ML SOLN COMPARISON:  CT abdomen/pelvis dated 01/09/2022 FINDINGS: VASCULAR Aorta: No evidence of abdominal aortic aneurysm. Patent. Atherosclerotic calcifications. Celiac: Patent. SMA: Patent. Renals: Patent bilaterally. Atherosclerotic calcifications at the origin bilaterally. IMA: Patent. Inflow: Patent bilaterally. Atherosclerotic calcifications. Proximal Outflow: Patent  bilaterally. Atherosclerotic calcifications. Veins: Within normal limits. Review of the MIP images confirms the above findings. NON-VASCULAR Lower chest: Lung bases are clear. Hepatobiliary: Liver is within normal limits. Suspected layering small gallstones versus gallbladder sludge (series 4/image 59). No convincing pericholecystic inflammatory changes. No intrahepatic ductal dilatation. Mildly dilated common duct, measuring 12 mm (series 9/image 57). Pancreas: Peripancreatic inflammatory changes along the pancreatic head since uncinate process and pancreaticoduodenal groove, suggesting acute pancreatitis. No drainable fluid collection/walled-off necrosis. Spleen: Within normal limits. Adrenals/Urinary Tract: Adrenal glands are within normal limits. 7 mm cyst in the lateral interpolar right kidney (series 11/image 33). Left kidney is within normal limits. No hydronephrosis. Thick-walled bladder with asymmetric wall thickening on the right (series 11/image 74), new from the prior. Stomach/Bowel: Stomach is within normal limits. No evidence of bowel obstruction. Inflammatory changes along the duodenum, suggesting duodenitis, although favored to be secondary. Normal appendix (series 11/image 85). Scattered mild right colonic diverticulosis, without evidence of diverticulitis. No colonic wall thickening or inflammatory changes. No pneumatosis or free  air. Lymphatic: No suspicious abdominopelvic lymphadenopathy. Reproductive: Status post hysterectomy. No adnexal masses. Other: No abdominopelvic ascites. Musculoskeletal: Mild lumbar dextroscoliosis with mild degenerative changes. IMPRESSION: Patent abdominal vasculature. No findings suspicious for mesenteric ischemia. Peripancreatic inflammatory changes/fluid, suggesting acute pancreatitis. No associated complication. Suspected secondary duodenitis. Cholelithiasis, without associated findings to suggest acute cholecystitis. Mildly dilated common duct, measuring 12 mm. Correlate with LFTs and consider ERCP or MRCP to exclude choledocholithiasis as clinically warranted. Asymmetric bladder wall thickening, new, correlate for cystitis. Electronically Signed   By: Charline Bills M.D.   On: 03/06/2022 18:49   DG Chest Port 1 View  Result Date: 03/06/2022 CLINICAL DATA:  Upper abdominal pain EXAM: PORTABLE CHEST 1 VIEW COMPARISON:  01/22/2022 FINDINGS: The heart size and mediastinal contours are within normal limits. Both lungs are clear. The visualized skeletal structures are unremarkable. IMPRESSION: No acute abnormality of the lungs in AP portable projection. Electronically Signed   By: Jearld Lesch M.D.   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  Likely she has passed a stone though abnormal LFTs could be related to alcohol hepatitis/cirrhosis.  We will plan on MRI/MRCP to further evaluate in the a.m.  Surgery consult in the a.m. to discuss possible cholecystectomy.  If MRCP shows choledocholithiasis, will need transfer to [facility] for ERCP.  Patient with cirrhosis though well compensated with low meld.  Start on liquid diet today.  Antiemetics and analgesia  as needed.  Outpatient follow-up with GI for cirrhosis management.  Outpatient EGD/colonoscopy given her chronic anemia as well as variceal screening.  Thank you for the consultation.  Hennie Duos. Marletta Lor, D.O. Gastroenterology and Hepatology [facility]    LOS: 1 day     03/07/2022, 12:01 PM

## 2022-03-08 ENCOUNTER — Inpatient Hospital Stay (HOSPITAL_COMMUNITY): Payer: Medicare Other

## 2022-03-08 DIAGNOSIS — K7030 Alcoholic cirrhosis of liver without ascites: Secondary | ICD-10-CM | POA: Diagnosis not present

## 2022-03-08 DIAGNOSIS — K759 Inflammatory liver disease, unspecified: Secondary | ICD-10-CM | POA: Diagnosis not present

## 2022-03-08 DIAGNOSIS — K851 Biliary acute pancreatitis without necrosis or infection: Secondary | ICD-10-CM | POA: Diagnosis not present

## 2022-03-08 DIAGNOSIS — N390 Urinary tract infection, site not specified: Secondary | ICD-10-CM

## 2022-03-08 DIAGNOSIS — E876 Hypokalemia: Secondary | ICD-10-CM | POA: Diagnosis not present

## 2022-03-08 DIAGNOSIS — R7989 Other specified abnormal findings of blood chemistry: Secondary | ICD-10-CM | POA: Diagnosis not present

## 2022-03-08 LAB — COMPREHENSIVE METABOLIC PANEL
ALT: 19 U/L (ref 0–44)
AST: 34 U/L (ref 15–41)
Albumin: 1.8 g/dL — ABNORMAL LOW (ref 3.5–5.0)
Alkaline Phosphatase: 107 U/L (ref 38–126)
Anion gap: 5 (ref 5–15)
BUN: 8 mg/dL (ref 8–23)
CO2: 22 mmol/L (ref 22–32)
Calcium: 7.9 mg/dL — ABNORMAL LOW (ref 8.9–10.3)
Chloride: 109 mmol/L (ref 98–111)
Creatinine, Ser: 0.6 mg/dL (ref 0.44–1.00)
GFR, Estimated: >60 mL/min (ref >60)
Glucose, Bld: 72 mg/dL (ref 70–99)
Potassium: 3.3 mmol/L — ABNORMAL LOW (ref 3.5–5.1)
Sodium: 136 mmol/L (ref 135–145)
Total Bilirubin: 1.2 mg/dL (ref 0.3–1.2)
Total Protein: 4 g/dL — ABNORMAL LOW (ref 6.5–8.1)

## 2022-03-08 LAB — CBC
HCT: 22.8 % — ABNORMAL LOW (ref 36.0–46.0)
Hemoglobin: 7.2 g/dL — ABNORMAL LOW (ref 12.0–15.0)
MCH: 30.8 pg (ref 26.0–34.0)
MCHC: 31.6 g/dL (ref 30.0–36.0)
MCV: 97.4 fL (ref 80.0–100.0)
Platelets: 164 10*3/uL (ref 150–400)
RBC: 2.34 MIL/uL — ABNORMAL LOW (ref 3.87–5.11)
RDW: 14.4 % (ref 11.5–15.5)
WBC: 4.9 10*3/uL (ref 4.0–10.5)
nRBC: 0 % (ref 0.0–0.2)

## 2022-03-08 LAB — IRON AND TIBC
Iron: 55 ug/dL (ref 28–170)
Saturation Ratios: 51 % — ABNORMAL HIGH (ref 10.4–31.8)
TIBC: 108 ug/dL — ABNORMAL LOW (ref 250–450)
UIBC: 53 ug/dL

## 2022-03-08 LAB — RETICULOCYTES
Immature Retic Fract: 17.3 % — ABNORMAL HIGH (ref 2.3–15.9)
RBC.: 2.67 MIL/uL — ABNORMAL LOW (ref 3.87–5.11)
Retic Count, Absolute: 86.5 10*3/uL (ref 19.0–186.0)
Retic Ct Pct: 3.2 % — ABNORMAL HIGH (ref 0.4–3.1)

## 2022-03-08 LAB — AMMONIA: Ammonia: <10 umol/L (ref 9–35)

## 2022-03-08 LAB — FERRITIN: Ferritin: 676 ng/mL — ABNORMAL HIGH (ref 11–307)

## 2022-03-08 LAB — URINE CULTURE: Culture: NO GROWTH

## 2022-03-08 LAB — LIPASE, BLOOD: Lipase: 54 U/L — ABNORMAL HIGH (ref 11–51)

## 2022-03-08 LAB — FOLATE: Folate: 11.2 ng/mL (ref >5.9)

## 2022-03-08 LAB — VITAMIN B12: Vitamin B-12: 1222 pg/mL — ABNORMAL HIGH (ref 180–914)

## 2022-03-08 LAB — PROTIME-INR
INR: 1.2 (ref 0.8–1.2)
Prothrombin Time: 14.8 seconds (ref 11.4–15.2)

## 2022-03-08 MED ORDER — MELATONIN 3 MG PO TABS
6.0000 mg | ORAL_TABLET | Freq: Every day | ORAL | Status: DC
  Administered 2022-03-08 – 2022-03-15 (×8): 6 mg via ORAL
  Filled 2022-03-08 (×8): qty 2

## 2022-03-08 MED ORDER — GADOBUTROL 1 MMOL/ML IV SOLN
5.0000 mL | Freq: Once | INTRAVENOUS | Status: AC | PRN
  Administered 2022-03-08: 5 mL via INTRAVENOUS

## 2022-03-08 NOTE — Progress Notes (Signed)
PROGRESS NOTE   Maria Dunlap  MEQ:[id number] DOB: [birth year] DOA: 03/06/2022 PCP: Sonny Masters, FNP   Chief Complaint  Patient presents with   Abdominal Pain   Level of care: Med-Surg  Brief Admission History:  71 y.o. female with medical history significant for alcohol abuse, liver cirrhosis, hypertension, prediabetes.  Patient presented to the ED with complaints of abdominal pain started about 11 this morning, with 3 episodes of vomiting.  She also reports multiple loose stools yesterday.  She reports pain with urination that started today.  She reports she has not drunk any alcoholic beverage in at least 6 weeks.  Spouse is at bedside.  Patient was also treated for UTI about 2-1/2 weeks ago, she completed 7 days of an antibiotic- ? Name.    Recent hospitalization 9/1 to 9/8 for hypernatremia- 151 and acute metabolic encephalopathy.  MRI brain CT negative for acute abnormality.  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   ED Course: Stable vitals.  Lipase 1643.  Mild elevation in liver enzymes - AST 130, ALT 31, ALP 178, total bilirubin 2.  Lactic acidosis 3.6 > 3.5.  Potassium 3.2.  Magnesium 1.5.  CT abdomen and pelvis with contrast-patent abdominal vasculature, suggests acute pancreatitis, secondary duodenitis, cholelithiasis without acute cholecystitis, cystitis, and mildly dilated common duct, consider MRCP or ERCP.  EDP talked to Dr. Marletta Lor, recommended consulting [facility]. EDP talked with Dr. Myrtie Neither on-call for GI, recommended patient stay here at [facility], patient likely passed stone, transferred to [facility] on Monday if ERCP MRCP still needed.   Assessment and Plan: * Acute gallstone pancreatitis Presenting with abdominal pain vomiting and diarrhea yesterday.  Lipase elevated at 1643, minimal liver enzyme elevation, AST 130, and ALP of  178, total bilirubin 2.  ALT WNL- 31.  CT findings-just an acute pancreatitis, secondary to duodenitis.  No associated complication.  Also shows cholelithiasis, mildly dilated common bile duct- consider ERCP or MRCP to exclude choledocholithiasis as clinically warranted. -  EDP talked to Dr. Marletta Lor, recommended consulting [facility]. EDP talked with Dr. Myrtie Neither on-call for GI, recommended patient stay here at [facility], patient likely passed stone, transfer to [facility] on Monday-10/16 if ERCP needed -Patient refused MRCP today after extensive counseling continues to refuse study   UTI (urinary tract infection) Reports dysuria today, rule out for sepsis, afebrile, WBC 10.4.  Lactic acidosis of 3.6 > 3.5 likely from dehydration, pancreatitis,  underlying liver cirrhosis and likely poor clearance also.  Reports completing 7 day course of antibiotics about 2-1/2 weeks ago.  Recent urine cultures growing greater than 100,000 colonies of yeast. -Follow-up urine cultures -Continue with IV ceftriaxone 1 g daily x 3 doses  Prolonged QT interval QTc 536.  Not on QT prolonging medications. -Repleted electrolytes  Cirrhosis of liver without ascites (HCC) History of alcohol abuse.  Last alcoholic beverage 6 weeks ago at least.  Has several small purpura to bilateral lower extremities.  Platelets within normal at 263. - PT/INR WNL    Prediabetes - hgba1c 4.0  Hypokalemia Potassium 3.2.  Also hypomagnesemia 1.5.  Likely from vomiting and diarrhea. -Repleted   DVT prophylaxis: enoxaparin Code Status: Full  Family Communication: husband bedside  Disposition: Status is: Inpatient Remains inpatient appropriate because: IV fluids required    Consultants:  GI  Procedures:   Antimicrobials:  Ceftriaxone 10/14>>  Subjective: Pt is more alert and not  having abd pain but more agitated and confused now, her baseline per husband.   Objective: Vitals:   03/07/22 1540 03/07/22 2020 03/08/22 0638  03/08/22 1354  BP: 108/66 130/71 130/76 139/78  Pulse: 70 86 73 79  Resp: 18 19 19 18   Temp: 98.1 F (36.7 C) 98.5 F (36.9 C) (!) 97.5 F (36.4 C) 97.8 F (36.6 C)  TempSrc: Oral   Oral  SpO2: 100% 100% 100% 100%  Weight:      Height:        Intake/Output Summary (Last 24 hours) at 03/08/2022 1425 Last data filed at 03/08/2022 0300 Gross per 24 hour  Intake 1057.99 ml  Output --  Net 1057.99 ml   Filed Weights   03/06/22 1821  Weight: 51.5 kg   Examination:  General exam: elderly frail female, awake, alert, confused, Appears calm and comfortable  Respiratory system: Clear to auscultation. Respiratory effort normal. Cardiovascular system: normal S1 & S2 heard. No JVD, murmurs, rubs, gallops or clicks. No pedal edema. Gastrointestinal system: Abdomen is nondistended, soft and mild epigastric tenderness nearly resolved. No organomegaly or masses felt. Normal bowel sounds heard. Central nervous system: Alert and oriented. No focal neurological deficits. Extremities: Symmetric 5 x 5 power. Skin: No rashes, lesions or ulcers. Psychiatry: Judgement and insight appear poor. Mood & affect appropriate.   Data Reviewed: I have personally reviewed following labs and imaging studies  CBC: Recent Labs  Lab 03/06/22 1530 03/07/22 0451 03/08/22 0440  WBC 10.4 7.1 4.9  NEUTROABS 8.0*  --   --   HGB 10.5* 8.8* 7.2*  HCT 32.3* 28.3* 22.8*  MCV 95.6 98.3 97.4  PLT 263 200 164    Basic Metabolic Panel: Recent Labs  Lab 03/06/22 1530 03/07/22 0451 03/08/22 0440  NA 136 136 136  K 3.2* 3.9 3.3*  CL 106 109 109  CO2 18* 20* 22  GLUCOSE 150* 76 72  BUN 11 11 8   CREATININE 0.78 0.59 0.60  CALCIUM 9.0 8.4* 7.9*  MG 1.5* 2.2  --     CBG: Recent Labs  Lab 03/07/22 0045  GLUCAP 91    Recent Results (from the past 240 hour(s))  Culture, blood (single)     Status: None (Preliminary result)   Collection Time: 03/06/22  4:49 PM   Specimen: BLOOD  Result Value Ref Range  Status   Specimen Description   Final    BLOOD RIGHT ANTECUBITAL BOTTLES DRAWN AEROBIC AND ANAEROBIC   Special Requests   Final    Blood Culture results may not be optimal due to an excessive volume of blood received in culture bottles   Culture   Final    NO GROWTH 2 DAYS Performed at [facility], [address] [place], [address] [place]    Report Status PENDING  Incomplete  Urine Culture     Status: None   Collection Time: 03/06/22  5:52 PM   Specimen: Urine, Catheterized  Result Value Ref Range Status   Specimen Description   Final    URINE, CATHETERIZED Performed at [facility], [address], [place], [address] [place]    Special Requests   Final    NONE Performed at [facility], [address], [place], [address] [place]    Culture   Final    NO GROWTH Performed at [facility], [address] [place], [address] [place]    Report Status 03/08/2022 FINAL  Final     Radiology Studies: CT Angio Abd/Pel W  and/or Wo Contrast  Result Date: 03/06/2022 CLINICAL DATA:  Abdominal pain, diarrhea, evaluate for mesenteric ischemia EXAM: CTA ABDOMEN AND PELVIS WITHOUT AND WITH CONTRAST TECHNIQUE: Multidetector CT imaging of the abdomen and pelvis was performed using the standard protocol during bolus administration of intravenous contrast. Multiplanar reconstructed images and MIPs were obtained and reviewed to evaluate the vascular anatomy. RADIATION DOSE REDUCTION: This exam was performed according to the departmental dose-optimization program which includes automated exposure control, adjustment of the mA and/or kV according to patient size and/or use of iterative reconstruction technique. CONTRAST:  10mL OMNIPAQUE IOHEXOL 350 MG/ML SOLN COMPARISON:  CT abdomen/pelvis dated 01/09/2022 FINDINGS: VASCULAR Aorta: No evidence of abdominal aortic aneurysm. Patent. Atherosclerotic calcifications. Celiac: Patent. SMA: Patent. Renals: Patent bilaterally. Atherosclerotic calcifications at  the origin bilaterally. IMA: Patent. Inflow: Patent bilaterally. Atherosclerotic calcifications. Proximal Outflow: Patent bilaterally. Atherosclerotic calcifications. Veins: Within normal limits. Review of the MIP images confirms the above findings. NON-VASCULAR Lower chest: Lung bases are clear. Hepatobiliary: Liver is within normal limits. Suspected layering small gallstones versus gallbladder sludge (series 4/image 59). No convincing pericholecystic inflammatory changes. No intrahepatic ductal dilatation. Mildly dilated common duct, measuring 12 mm (series 9/image 57). Pancreas: Peripancreatic inflammatory changes along the pancreatic head since uncinate process and pancreaticoduodenal groove, suggesting acute pancreatitis. No drainable fluid collection/walled-off necrosis. Spleen: Within normal limits. Adrenals/Urinary Tract: Adrenal glands are within normal limits. 7 mm cyst in the lateral interpolar right kidney (series 11/image 33). Left kidney is within normal limits. No hydronephrosis. Thick-walled bladder with asymmetric wall thickening on the right (series 11/image 74), new from the prior. Stomach/Bowel: Stomach is within normal limits. No evidence of bowel obstruction. Inflammatory changes along the duodenum, suggesting duodenitis, although favored to be secondary. Normal appendix (series 11/image 85). Scattered mild right colonic diverticulosis, without evidence of diverticulitis. No colonic wall thickening or inflammatory changes. No pneumatosis or free air. Lymphatic: No suspicious abdominopelvic lymphadenopathy. Reproductive: Status post hysterectomy. No adnexal masses. Other: No abdominopelvic ascites. Musculoskeletal: Mild lumbar dextroscoliosis with mild degenerative changes. IMPRESSION: Patent abdominal vasculature. No findings suspicious for mesenteric ischemia. Peripancreatic inflammatory changes/fluid, suggesting acute pancreatitis. No associated complication. Suspected secondary duodenitis.  Cholelithiasis, without associated findings to suggest acute cholecystitis. Mildly dilated common duct, measuring 12 mm. Correlate with LFTs and consider ERCP or MRCP to exclude choledocholithiasis as clinically warranted. Asymmetric bladder wall thickening, new, correlate for cystitis. Electronically Signed   By: Charline Bills M.D.   On: 03/06/2022 18:49   DG Chest Port 1 View  Result Date: 03/06/2022 CLINICAL DATA:  Upper abdominal pain EXAM: PORTABLE CHEST 1 VIEW COMPARISON:  01/22/2022 FINDINGS: The heart size and mediastinal contours are within normal limits. Both lungs are clear. The visualized skeletal structures are unremarkable. IMPRESSION: No acute abnormality of the lungs in AP portable projection. Electronically Signed   By: Jearld Lesch M.D.   On: 03/06/2022 15:49    Scheduled Meds:  enoxaparin (LOVENOX) injection  40 mg Subcutaneous Q24H   melatonin  6 mg Oral QHS   Continuous Infusions:  cefTRIAXone (ROCEPHIN)  IV 1 g (03/08/22 0600)   lactated ringers 125 mL/hr at 03/08/22 0557     LOS: 2 days   Time spent: 35 mins  Maria Burstein Laural Benes, MD How to contact the [facility] Attending or Consulting provider 7A - 7P or covering provider during after hours 7P -7A, for this patient?  Check the care team in [facility] and look for a) attending/consulting TRH provider listed and b) the [facility] team listed Log into [email or web address] and  use [facility] universal password to access. If you do not have the password, please contact the hospital operator. Locate the [facility] provider you are looking for under [facility] and page to a number that you can be directly reached. If you still have difficulty reaching the provider, please page the [facility] (Director on Call) for the Hospitalists listed on amion for assistance.  03/08/2022, 2:25 PM

## 2022-03-08 NOTE — Progress Notes (Signed)
  Transition of Care [facility]) Screening Note   Patient Details  Name: LEILANNI HALVORSON Date of Birth: Oct 12, 1950   Transition of Care [facility]) CM/SW Contact:    Iona Beard, [place] Phone Number: 03/08/2022, 10:32 AM    Transition of Care Department [facility]) has reviewed patient and no TOC needs have been identified at this time. We will continue to monitor patient advancement through interdisciplinary progression rounds. If new patient transition needs arise, please place a TOC consult.

## 2022-03-08 NOTE — Progress Notes (Signed)
Knows name but has been confused otherwise.  Pulled out IV earlier in shift and keeps asking to go home..Denies nausea and abd pain but complained of back and leg pain.  Received tylenol.  Husband at bedside and asked for something for patient to sleep and received order from Dr. Beatriz Chancellor for melatonin which was given along with morphine 1 mg prn.  Husband spending night. Bed alarm set.

## 2022-03-08 NOTE — Consult Note (Signed)
Reason for Consult: Gallstone pancreatitis Referring Physician: Dr. [name] is an 71 y.o. female.  HPI: Patient is a 70 year old white female with a past medical history significant for chronic alcohol use, cirrhosis, hypertension, diabetes mellitus, and multiple recent hospitalizations for electrolyte imbalance who presented to the hospital with worsening right upper quadrant abdominal pain, nausea, and vomiting.  She was noted to have transaminitis as well as a total bilirubin of 2.  She was also noted to be slightly anemic.  A CT scan of the abdomen pelvis revealed peripancreatic inflammation suggestive of acute pancreatitis with a mildly dilated common bile duct at 12 mm.  There was biliary sludge without evidence of cholecystitis.  She has been seen by Maria Dunlap of GI.  Her lipase was 1643 at the time of admission.  Today, the patient denies any abdominal pain, nausea, or vomiting.  Her mental status is somewhat variable as she is unable to give me a clear history.  I did also try to convince her to undergo an MRCP today and she refused when she got down to the radiology suite.  Her reasoning for not having an MRI did not make any sense.  Past Medical History:  Diagnosis Date   Alcohol abuse    Dysphagia    Eczema    Essential hypertension    GERD (gastroesophageal reflux disease)    Gout    Hyperlipidemia    Liver cirrhosis (HCC)    Menopause    Metabolic encephalopathy    Prediabetes    Statin intolerance     Past Surgical History:  Procedure Laterality Date   ABDOMINAL HYSTERECTOMY     ABDOMINAL SURGERY      Family History  Problem Relation Age of Onset   Hypertension Mother    Heart disease Mother    Stroke Father    Heart disease Father    Cancer Brother    COPD Brother    Prostate cancer Brother     Social History:  reports that she quit smoking about 29 years ago. Her smoking use included cigarettes. She does not have any smokeless tobacco  history on file. She reports current alcohol use. She reports that she does not use drugs.  Allergies:  Allergies  Allergen Reactions   Amoxicillin Swelling    Neck swell Throat swelling   Allopurinol Other (See Comments)    Elevated LFT's Elevated LFT's   Macrolides And Ketolides    Mycinette [Phenol]    Nitrofurantoin Other (See Comments)   Simvastatin Other (See Comments)    Elevated LFT's  Elevated LFT's   Rosuvastatin Rash    dizziness   Triamcinolone Rash and Other (See Comments)   Triamcinolone Acetonide Rash    Medications: I have reviewed the patient's current medications.  Results for orders placed or performed during the hospital encounter of 03/06/22 (from the past 48 hour(s))  Hemoglobin A1c     Status: Abnormal   Collection Time: 03/06/22  3:17 PM  Result Value Ref Range   Hgb A1c MFr Bld 4.0 (L) 4.8 - 5.6 %    Comment: (NOTE) Pre diabetes:          5.7%-6.4%  Diabetes:              >6.4%  Glycemic control for   <7.0% adults with diabetes    Mean Plasma Glucose 68.1 mg/dL    Comment: Performed at [facility], [address], [place], [place] [address]  Comprehensive  metabolic panel     Status: Abnormal   Collection Time: 03/06/22  3:30 PM  Result Value Ref Range   Sodium 136 135 - 145 mmol/L   Potassium 3.2 (L) 3.5 - 5.1 mmol/L   Chloride 106 98 - 111 mmol/L   CO2 18 (L) 22 - 32 mmol/L   Glucose, Bld 150 (H) 70 - 99 mg/dL    Comment: Glucose reference range applies only to samples taken after fasting for at least 8 hours.   BUN 11 8 - 23 mg/dL   Creatinine, Ser 0.78 0.44 - 1.00 mg/dL   Calcium 9.0 8.9 - 10.3 mg/dL   Total Protein 6.1 (L) 6.5 - 8.1 g/dL   Albumin 2.7 (L) 3.5 - 5.0 g/dL   AST 130 (H) 15 - 41 U/L   ALT 31 0 - 44 U/L   Alkaline Phosphatase 178 (H) 38 - 126 U/L   Total Bilirubin 2.0 (H) 0.3 - 1.2 mg/dL   GFR, Estimated >60 >60 mL/min    Comment: (NOTE) Calculated using the CKD-EPI Creatinine Equation (2021)    Anion gap  12 5 - 15    Comment: Performed at [facility], [address], [place], [place] [address]  Lipase, blood     Status: Abnormal   Collection Time: 03/06/22  3:30 PM  Result Value Ref Range   Lipase 1,643 (H) 11 - 51 U/L    Comment: RESULTS CONFIRMED BY MANUAL DILUTION Performed at [facility], [address] [place], [place] [address]   CBC with Differential     Status: Abnormal   Collection Time: 03/06/22  3:30 PM  Result Value Ref Range   WBC 10.4 4.0 - 10.5 K/uL   RBC 3.38 (L) 3.87 - 5.11 MIL/uL   Hemoglobin 10.5 (L) 12.0 - 15.0 g/dL   HCT 32.3 (L) 36.0 - 46.0 %   MCV 95.6 80.0 - 100.0 fL   MCH 31.1 26.0 - 34.0 pg   MCHC 32.5 30.0 - 36.0 g/dL   RDW 14.4 11.5 - 15.5 %   Platelets 263 150 - 400 K/uL   nRBC 0.0 0.0 - 0.2 %   Neutrophils Relative % 78 %   Neutro Abs 8.0 (H) 1.7 - 7.7 K/uL   Lymphocytes Relative 17 %   Lymphs Abs 1.8 0.7 - 4.0 K/uL   Monocytes Relative 5 %   Monocytes Absolute 0.5 0.1 - 1.0 K/uL   Eosinophils Relative 0 %   Eosinophils Absolute 0.0 0.0 - 0.5 K/uL   Basophils Relative 0 %   Basophils Absolute 0.0 0.0 - 0.1 K/uL   Immature Granulocytes 0 %   Abs Immature Granulocytes 0.03 0.00 - 0.07 K/uL    Comment: Performed at [facility], [address] [place], [place] [address]  Magnesium     Status: Abnormal   Collection Time: 03/06/22  3:30 PM  Result Value Ref Range   Magnesium 1.5 (L) 1.7 - 2.4 mg/dL    Comment: Performed at [facility], [address], [place], [place] [address]  Lactic acid, plasma     Status: Abnormal   Collection Time: 03/06/22  3:40 PM  Result Value Ref Range   Lactic Acid, Venous 3.6 (HH) 0.5 - 1.9 mmol/L    Comment: CRITICAL RESULT CALLED TO, READ BACK BY AND VERIFIED WITH: M.DOSS BY LBASTON AT 1635, 03/06/22 Performed at [facility], [address], [place], [place] [address]   Ethanol     Status: None   Collection Time: 03/06/22  3:40 PM  Result Value Ref Range   Alcohol, Ethyl (B) <10 <10 mg/dL    Comment:  (NOTE) Lowest detectable limit for serum alcohol is 10 mg/dL.  For medical purposes only. Performed at [facility], [address] [place], [place] [address]   Protime-INR     Status: None   Collection Time: 03/06/22  3:53 PM  Result Value Ref Range   Prothrombin Time 12.8 11.4 - 15.2 seconds   INR 1.0 0.8 - 1.2    Comment: (NOTE) INR goal varies based on device and disease states. Performed at [facility], [address] [place], [place] [address]   Urinalysis, Routine w reflex microscopic Urine, In & Out Cath     Status: Abnormal   Collection Time: 03/06/22  4:25 PM  Result Value Ref Range   Color, Urine YELLOW YELLOW   APPearance HAZY (A) CLEAR   Specific Gravity, Urine 1.016 1.005 - 1.030   pH 5.0 5.0 - 8.0   Glucose, UA NEGATIVE NEGATIVE mg/dL   Hgb urine dipstick NEGATIVE NEGATIVE   Bilirubin Urine NEGATIVE NEGATIVE   Ketones, ur 5 (A) NEGATIVE mg/dL   Protein, ur NEGATIVE NEGATIVE mg/dL   Nitrite NEGATIVE NEGATIVE   Leukocytes,Ua LARGE (A) NEGATIVE   RBC / HPF 0-5 0 - 5 RBC/hpf   WBC, UA >50 (H) 0 - 5 WBC/hpf   Bacteria, UA RARE (A) NONE SEEN   Squamous Epithelial / LPF 0-5 0 - 5   Mucus PRESENT    Budding Yeast PRESENT    Hyaline Casts, UA PRESENT    Ca Oxalate Crys, UA PRESENT     Comment: Performed at [facility], [address], [place], [place] [address]  Culture, blood (single)     Status: None (Preliminary result)   Collection Time: 03/06/22  4:49 PM   Specimen: BLOOD  Result Value Ref Range   Specimen Description      BLOOD RIGHT ANTECUBITAL BOTTLES DRAWN AEROBIC AND ANAEROBIC   Special Requests      Blood Culture results may not be optimal due to an excessive volume of blood received in culture bottles   Culture      NO GROWTH 2 DAYS Performed at [facility], [address] [place], [place] [address]    Report Status PENDING   Lactic acid, plasma     Status: Abnormal   Collection Time: 03/06/22  5:43 PM  Result Value Ref Range   Lactic Acid,  Venous 3.5 (HH) 0.5 - 1.9 mmol/L    Comment: CRITICAL VALUE NOTED.  VALUE IS CONSISTENT WITH PREVIOUSLY REPORTED AND CALLED VALUE. Performed at [facility], [address] [place], [place] [address]   Urine Culture     Status: None   Collection Time: 03/06/22  5:52 PM   Specimen: Urine, Catheterized  Result Value Ref Range   Specimen Description      URINE, CATHETERIZED Performed at [facility], [address], [place], [place] [address]    Special Requests      NONE Performed at [facility], [address] [place], [place] [address]    Culture      NO GROWTH Performed at [facility], [address] [place], [place] [address]    Report Status 03/08/2022 FINAL   Lactic acid, plasma     Status: None   Collection Time: 03/06/22 11:18 PM  Result Value Ref Range   Lactic Acid, Venous 1.9 0.5 - 1.9 mmol/L    Comment: Performed  at [facility], [address] [place], [place] [address]  Glucose, capillary     Status: None   Collection Time: 03/07/22 12:45 AM  Result Value Ref Range   Glucose-Capillary 91 70 - 99 mg/dL    Comment: Glucose reference range applies only to samples taken after fasting for at least 8 hours.  Comprehensive metabolic panel     Status: Abnormal   Collection Time: 03/07/22  4:51 AM  Result Value Ref Range   Sodium 136 135 - 145 mmol/L   Potassium 3.9 3.5 - 5.1 mmol/L    Comment: DELTA CHECK NOTED   Chloride 109 98 - 111 mmol/L   CO2 20 (L) 22 - 32 mmol/L   Glucose, Bld 76 70 - 99 mg/dL    Comment: Glucose reference range applies only to samples taken after fasting for at least 8 hours.   BUN 11 8 - 23 mg/dL   Creatinine, Ser 4.31 0.44 - 1.00 mg/dL   Calcium 8.4 (L) 8.9 - 10.3 mg/dL   Total Protein 4.9 (L) 6.5 - 8.1 g/dL   Albumin 2.2 (L) 3.5 - 5.0 g/dL   AST 62 (H) 15 - 41 U/L   ALT 27 0 - 44 U/L   Alkaline Phosphatase 146 (H) 38 - 126 U/L   Total Bilirubin 1.5 (H) 0.3 - 1.2 mg/dL   GFR, Estimated >54 >00 mL/min    Comment:  (NOTE) Calculated using the CKD-EPI Creatinine Equation (2021)    Anion gap 7 5 - 15    Comment: Performed at [facility], [address] [place], [place] [address]  CBC     Status: Abnormal   Collection Time: 03/07/22  4:51 AM  Result Value Ref Range   WBC 7.1 4.0 - 10.5 K/uL   RBC 2.88 (L) 3.87 - 5.11 MIL/uL   Hemoglobin 8.8 (L) 12.0 - 15.0 g/dL   HCT 95.0 (L) 93.2 - 67.1 %   MCV 98.3 80.0 - 100.0 fL   MCH 30.6 26.0 - 34.0 pg   MCHC 31.1 30.0 - 36.0 g/dL   RDW 24.5 80.9 - 98.3 %   Platelets 200 150 - 400 K/uL   nRBC 0.3 (H) 0.0 - 0.2 %    Comment: Performed at [facility], [address] [place], [place] [address]  Magnesium     Status: None   Collection Time: 03/07/22  4:51 AM  Result Value Ref Range   Magnesium 2.2 1.7 - 2.4 mg/dL    Comment: Performed at [facility], [address] [place], [place] [address]  Lipase, blood     Status: Abnormal   Collection Time: 03/07/22  4:51 AM  Result Value Ref Range   Lipase 261 (H) 11 - 51 U/L    Comment: Performed at [facility], [address] [place], [place] [address]  Lipase, blood     Status: Abnormal   Collection Time: 03/08/22  4:40 AM  Result Value Ref Range   Lipase 54 (H) 11 - 51 U/L    Comment: Performed at [facility], [address] [place], [place] [address]  Comprehensive metabolic panel     Status: Abnormal   Collection Time: 03/08/22  4:40 AM  Result Value Ref Range   Sodium 136 135 - 145 mmol/L   Potassium 3.3 (L) 3.5 - 5.1 mmol/L   Chloride 109 98 - 111 mmol/L   CO2 22 22 - 32 mmol/L   Glucose, Bld 72 70 - 99 mg/dL    Comment:  Glucose reference range applies only to samples taken after fasting for at least 8 hours.   BUN 8 8 - 23 mg/dL   Creatinine, Ser 6.210.60 0.44 - 1.00 mg/dL   Calcium 7.9 (L) 8.9 - 10.3 mg/dL   Total Protein 4.0 (L) 6.5 - 8.1 g/dL   Albumin 1.8 (L) 3.5 - 5.0 g/dL   AST 34 15 - 41 U/L   ALT 19 0 - 44 U/L   Alkaline Phosphatase 107 38 - 126 U/L   Total Bilirubin 1.2 0.3 - 1.2 mg/dL    GFR, Estimated >30>60 >86>60 mL/min    Comment: (NOTE) Calculated using the CKD-EPI Creatinine Equation (2021)    Anion gap 5 5 - 15    Comment: Performed at [facility], [address] [place], [place] [address]  CBC     Status: Abnormal   Collection Time: 03/08/22  4:40 AM  Result Value Ref Range   WBC 4.9 4.0 - 10.5 K/uL   RBC 2.34 (L) 3.87 - 5.11 MIL/uL   Hemoglobin 7.2 (L) 12.0 - 15.0 g/dL   HCT 96.222.8 (L) 95.236.0 - 84.146.0 %   MCV 97.4 80.0 - 100.0 fL   MCH 30.8 26.0 - 34.0 pg   MCHC 31.6 30.0 - 36.0 g/dL   RDW 32.414.4 40.111.5 - 02.715.5 %   Platelets 164 150 - 400 K/uL   nRBC 0.0 0.0 - 0.2 %    Comment: Performed at [facility], [address] [place], [place] [address]  Ammonia     Status: None   Collection Time: 03/08/22 11:49 AM  Result Value Ref Range   Ammonia <10 9 - 35 umol/L    Comment: Performed at [facility], [address] [place], [place] [address]  Protime-INR     Status: None   Collection Time: 03/08/22 11:49 AM  Result Value Ref Range   Prothrombin Time 14.8 11.4 - 15.2 seconds   INR 1.2 0.8 - 1.2    Comment: (NOTE) INR goal varies based on device and disease states. Performed at [facility], [address] [place], [place] [address]     CT Angio Abd/Pel W and/or Wo Contrast  Result Date: 03/06/2022 CLINICAL DATA:  Abdominal pain, diarrhea, evaluate for mesenteric ischemia EXAM: CTA ABDOMEN AND PELVIS WITHOUT AND WITH CONTRAST TECHNIQUE: Multidetector CT imaging of the abdomen and pelvis was performed using the standard protocol during bolus administration of intravenous contrast. Multiplanar reconstructed images and MIPs were obtained and reviewed to evaluate the vascular anatomy. RADIATION DOSE REDUCTION: This exam was performed according to the departmental dose-optimization program which includes automated exposure control, adjustment of the mA and/or kV according to patient size and/or use of iterative reconstruction technique. CONTRAST:  80mL OMNIPAQUE IOHEXOL 350  MG/ML SOLN COMPARISON:  CT abdomen/pelvis dated 01/09/2022 FINDINGS: VASCULAR Aorta: No evidence of abdominal aortic aneurysm. Patent. Atherosclerotic calcifications. Celiac: Patent. SMA: Patent. Renals: Patent bilaterally. Atherosclerotic calcifications at the origin bilaterally. IMA: Patent. Inflow: Patent bilaterally. Atherosclerotic calcifications. Proximal Outflow: Patent bilaterally. Atherosclerotic calcifications. Veins: Within normal limits. Review of the MIP images confirms the above findings. NON-VASCULAR Lower chest: Lung bases are clear. Hepatobiliary: Liver is within normal limits. Suspected layering small gallstones versus gallbladder sludge (series 4/image 59). No convincing pericholecystic inflammatory changes. No intrahepatic ductal dilatation. Mildly dilated common duct, measuring 12 mm (series 9/image 57). Pancreas: Peripancreatic inflammatory changes along the pancreatic head since uncinate process and pancreaticoduodenal groove, suggesting acute pancreatitis. No drainable fluid collection/walled-off necrosis. Spleen: Within normal limits. Adrenals/Urinary Tract:  Adrenal glands are within normal limits. 7 mm cyst in the lateral interpolar right kidney (series 11/image 33). Left kidney is within normal limits. No hydronephrosis. Thick-walled bladder with asymmetric wall thickening on the right (series 11/image 74), new from the prior. Stomach/Bowel: Stomach is within normal limits. No evidence of bowel obstruction. Inflammatory changes along the duodenum, suggesting duodenitis, although favored to be secondary. Normal appendix (series 11/image 85). Scattered mild right colonic diverticulosis, without evidence of diverticulitis. No colonic wall thickening or inflammatory changes. No pneumatosis or free air. Lymphatic: No suspicious abdominopelvic lymphadenopathy. Reproductive: Status post hysterectomy. No adnexal masses. Other: No abdominopelvic ascites. Musculoskeletal: Mild lumbar dextroscoliosis  with mild degenerative changes. IMPRESSION: Patent abdominal vasculature. No findings suspicious for mesenteric ischemia. Peripancreatic inflammatory changes/fluid, suggesting acute pancreatitis. No associated complication. Suspected secondary duodenitis. Cholelithiasis, without associated findings to suggest acute cholecystitis. Mildly dilated common duct, measuring 12 mm. Correlate with LFTs and consider ERCP or MRCP to exclude choledocholithiasis as clinically warranted. Asymmetric bladder wall thickening, new, correlate for cystitis. Electronically Signed   By: Charline Bills M.D.   On: 03/06/2022 18:49   DG Chest Port 1 View  Result Date: 03/06/2022 CLINICAL DATA:  Upper abdominal pain EXAM: PORTABLE CHEST 1 VIEW COMPARISON:  01/22/2022 FINDINGS: The heart size and mediastinal contours are within normal limits. Both lungs are clear. The visualized skeletal structures are unremarkable. IMPRESSION: No acute abnormality of the lungs in AP portable projection. Electronically Signed   By: Jearld Lesch M.D.   On: 03/06/2022 15:49    ROS:  Review of systems not obtained due to patient factors.  Blood pressure 139/78, pulse 79, temperature 97.8 F (36.6 C), temperature source Oral, resp. rate 18, height  (1.549 m), weight 51.5 kg, SpO2 100 %. Physical Exam: Alert white female no acute distress Head is normocephalic, atraumatic Lungs clear to auscultation with equal breath sounds bilaterally Heart examination reveals a regular rate rhythm without S3, S4, murmurs Abdomen is soft, nontender, nondistended.  No hepatosplenomegaly, masses, or hernias identified.  Previous admissions dating pending reviewed  Assessment/Plan: Impression: Cholelithiasis with a dilated common bile duct, probable gallstone pancreatitis.  Patient does have a history of alcohol use but has not drank in the last 6 weeks.  She also has a history of cirrhosis which is mild.  She has refused her MRCP. Plan: We will have  the family try to convince the patient undergo an MRCP tomorrow.  In addition, we could get an ultrasound of the right upper quadrant to fully assess the hepatobiliary tree.  There is no need for acute surgical intervention at this time.  Patient did tell me that she would refuse surgery, but again her explanation was not reasoned.  We will follow with you.  Franky Macho 03/08/2022, 1:59 PM

## 2022-03-08 NOTE — Progress Notes (Signed)
MELD 3.0 is 14.

## 2022-03-08 NOTE — Progress Notes (Signed)
Gastroenterology Progress Note    Primary Gastroenterologist:  Dr. Abbey Chatters  Patient ID: Maria Dunlap; [id number]; 1950/08/21    Subjective   Pleasantly confused. Believes it is 71. States she is in [place]. Denies abdominal pain, N/V. Husband at bedside, stating she has had short-term memory issues for the past 2 months.    Objective   Vital signs in last 24 hours Temp:  [97.5 F (36.4 C)-98.5 F (36.9 C)] 97.5 F (36.4 C) (10/16 3570) Pulse Rate:  [65-86] 73 (10/16 0638) Resp:  [18-19] 19 (10/16 0638) BP: (108-130)/(66-76) 130/76 (10/16 0638) SpO2:  [99 %-100 %] 100 % (10/16 1779) Last BM Date : 03/06/22  Physical Exam General:   Alert and oriented to person only, no distress.  Head:  Normocephalic and atraumatic. Abdomen:  Bowel sounds present, soft, non-tender, non-distended. No HSM or hernias noted. No rebound or guarding. No masses appreciated  Extremities:  Without  edema. Neurologic:  Alert and  oriented to person only. NEGATIVE asterixis.    Intake/Output from previous day: 10/15 0701 - 10/16 0700 In: 1058 [I.V.:858; IV Piggyback:200] Out: -  Intake/Output this shift: No intake/output data recorded.  Lab Results  Recent Labs    03/06/22 1530 03/07/22 0451 03/08/22 0440  WBC 10.4 7.1 4.9  HGB 10.5* 8.8* 7.2*  HCT 32.3* 28.3* 22.8*  PLT 263 200 164   BMET Recent Labs    03/06/22 1530 03/07/22 0451 03/08/22 0440  NA 136 136 136  K 3.2* 3.9 3.3*  CL 106 109 109  CO2 18* 20* 22  GLUCOSE 150* 76 72  BUN 11 11 8   CREATININE 0.78 0.59 0.60  CALCIUM 9.0 8.4* 7.9*   LFT Recent Labs    03/06/22 1530 03/07/22 0451 03/08/22 0440  PROT 6.1* 4.9* 4.0*  ALBUMIN 2.7* 2.2* 1.8*  AST 130* 62* 34  ALT 31 27 19   ALKPHOS 178* 146* 107  BILITOT 2.0* 1.5* 1.2   PT/INR Recent Labs    03/06/22 1553  LABPROT 12.8  INR 1.0   Lab Results  Component Value Date   IRON 48 02/17/2022   TIBC 148 (L) 02/17/2022   FERRITIN 1,489 (H)  02/17/2022     Studies/Results CT Angio Abd/Pel W and/or Wo Contrast  Result Date: 03/06/2022 CLINICAL DATA:  Abdominal pain, diarrhea, evaluate for mesenteric ischemia EXAM: CTA ABDOMEN AND PELVIS WITHOUT AND WITH CONTRAST TECHNIQUE: Multidetector CT imaging of the abdomen and pelvis was performed using the standard protocol during bolus administration of intravenous contrast. Multiplanar reconstructed images and MIPs were obtained and reviewed to evaluate the vascular anatomy. RADIATION DOSE REDUCTION: This exam was performed according to the departmental dose-optimization program which includes automated exposure control, adjustment of the mA and/or kV according to patient size and/or use of iterative reconstruction technique. CONTRAST:  37mL OMNIPAQUE IOHEXOL 350 MG/ML SOLN COMPARISON:  CT abdomen/pelvis dated 01/09/2022 FINDINGS: VASCULAR Aorta: No evidence of abdominal aortic aneurysm. Patent. Atherosclerotic calcifications. Celiac: Patent. SMA: Patent. Renals: Patent bilaterally. Atherosclerotic calcifications at the origin bilaterally. IMA: Patent. Inflow: Patent bilaterally. Atherosclerotic calcifications. Proximal Outflow: Patent bilaterally. Atherosclerotic calcifications. Veins: Within normal limits. Review of the MIP images confirms the above findings. NON-VASCULAR Lower chest: Lung bases are clear. Hepatobiliary: Liver is within normal limits. Suspected layering small gallstones versus gallbladder sludge (series 4/image 59). No convincing pericholecystic inflammatory changes. No intrahepatic ductal dilatation. Mildly dilated common duct, measuring 12 mm (series 9/image 57). Pancreas: Peripancreatic inflammatory changes along the pancreatic head since uncinate process and pancreaticoduodenal groove, suggesting  acute pancreatitis. No drainable fluid collection/walled-off necrosis. Spleen: Within normal limits. Adrenals/Urinary Tract: Adrenal glands are within normal limits. 7 mm cyst in the  lateral interpolar right kidney (series 11/image 33). Left kidney is within normal limits. No hydronephrosis. Thick-walled bladder with asymmetric wall thickening on the right (series 11/image 74), new from the prior. Stomach/Bowel: Stomach is within normal limits. No evidence of bowel obstruction. Inflammatory changes along the duodenum, suggesting duodenitis, although favored to be secondary. Normal appendix (series 11/image 85). Scattered mild right colonic diverticulosis, without evidence of diverticulitis. No colonic wall thickening or inflammatory changes. No pneumatosis or free air. Lymphatic: No suspicious abdominopelvic lymphadenopathy. Reproductive: Status post hysterectomy. No adnexal masses. Other: No abdominopelvic ascites. Musculoskeletal: Mild lumbar dextroscoliosis with mild degenerative changes. IMPRESSION: Patent abdominal vasculature. No findings suspicious for mesenteric ischemia. Peripancreatic inflammatory changes/fluid, suggesting acute pancreatitis. No associated complication. Suspected secondary duodenitis. Cholelithiasis, without associated findings to suggest acute cholecystitis. Mildly dilated common duct, measuring 12 mm. Correlate with LFTs and consider ERCP or MRCP to exclude choledocholithiasis as clinically warranted. Asymmetric bladder wall thickening, new, correlate for cystitis. Electronically Signed   By: Julian Hy M.D.   On: 03/06/2022 18:49   DG Chest Port 1 View  Result Date: 03/06/2022 CLINICAL DATA:  Upper abdominal pain EXAM: PORTABLE CHEST 1 VIEW COMPARISON:  01/22/2022 FINDINGS: The heart size and mediastinal contours are within normal limits. Both lungs are clear. The visualized skeletal structures are unremarkable. IMPRESSION: No acute abnormality of the lungs in AP portable projection. Electronically Signed   By: Delanna Ahmadi M.D.   On: 03/06/2022 15:49   DG Pelvis Portable  Result Date: 02/08/2022 CLINICAL DATA:  Pelvic pain after fall. EXAM:  PORTABLE PELVIS 1-2 VIEWS COMPARISON:  None Available. FINDINGS: There is no evidence of pelvic fracture or diastasis. No pelvic bone lesions are seen. IMPRESSION: Negative. Electronically Signed   By: Marijo Conception M.D.   On: 02/08/2022 15:18   CT Chest Wo Contrast  Result Date: 02/08/2022 CLINICAL DATA:  Unwitnessed fall EXAM: CT CHEST WITHOUT CONTRAST TECHNIQUE: Multidetector CT imaging of the chest was performed following the standard protocol without IV contrast. RADIATION DOSE REDUCTION: This exam was performed according to the departmental dose-optimization program which includes automated exposure control, adjustment of the mA and/or kV according to patient size and/or use of iterative reconstruction technique. COMPARISON:  01/09/2022 FINDINGS: Cardiovascular: Heart size is normal. Trace pericardial fluid. Thoracic aorta is nonaneurysmal. Scattered atherosclerotic vascular calcifications of the aorta and coronary arteries. Central pulmonary vasculature is nondilated. Mediastinum/Nodes: No enlarged mediastinal or axillary lymph nodes. Thyroid gland, trachea, and esophagus demonstrate no significant findings. Lungs/Pleura: Lungs are clear. No pleural effusion or pneumothorax. Upper Abdomen: No acute abnormality. Musculoskeletal: No chest wall mass or suspicious bone lesions identified. Subtle superior endplate compression deformity of T4 is unchanged from prior. Degenerative disc disease of the imaged cervical spine and to a lesser degree within the lower thoracic spine. IMPRESSION: 1. No acute findings within the chest. 2. Aortic and coronary artery atherosclerosis (ICD10-I70.0). Electronically Signed   By: Davina Poke D.O.   On: 02/08/2022 11:22   CT Head Wo Contrast  Result Date: 02/08/2022 CLINICAL DATA:  Unwitnessed fall EXAM: CT HEAD WITHOUT CONTRAST CT CERVICAL SPINE WITHOUT CONTRAST TECHNIQUE: Multidetector CT imaging of the head and cervical spine was performed following the standard  protocol without intravenous contrast. Multiplanar CT image reconstructions of the cervical spine were also generated. RADIATION DOSE REDUCTION: This exam was performed according to the departmental dose-optimization  program which includes automated exposure control, adjustment of the mA and/or kV according to patient size and/or use of iterative reconstruction technique. COMPARISON:  01/22/2022 FINDINGS: CT HEAD FINDINGS Brain: No evidence of acute infarction, hemorrhage, extra-axial collection, ventriculomegaly, or mass effect. Generalized cerebral atrophy. Periventricular white matter low attenuation likely secondary to microangiopathy. Vascular: Cerebrovascular atherosclerotic calcifications are noted. No hyperdense vessels. Skull: Negative for fracture or focal lesion. Sinuses/Orbits: Visualized portions of the orbits are unremarkable. Visualized portions of the paranasal sinuses are unremarkable. Visualized portions of the mastoid air cells are unremarkable. Other: None. CT CERVICAL SPINE FINDINGS Alignment: Loss of the normal cervical lordosis with straightening. Minimal anterolisthesis of C3 on C4 and C4 on C5. Minimal retrolisthesis of C5 on C6. Skull base and vertebrae: No acute fracture. No primary bone lesion or focal pathologic process. Soft tissues and spinal canal: No prevertebral fluid or swelling. No visible canal hematoma. Disc levels: Degenerative disease with disc height loss at C4-5, C5-6 and C6-7. At C3-4 the there is mild bilateral facet arthropathy. At C4-5 there is mild bilateral facet arthropathy and mild right foraminal stenosis. At C5-6 there is a broad-based disc osteophyte complex, bilateral uncovertebral degenerative changes, bilateral foraminal stenosis and bilateral facet arthropathy. At C6-7 there is a broad-based disc osteophyte complex. Upper chest: Lung apices are clear. Other: No fluid collection or hematoma. IMPRESSION: 1. No acute intracranial pathology. 2.  No acute osseous  injury of the cervical spine. 3. Cervical spine spondylosis as described above. Electronically Signed   By: Kathreen Devoid M.D.   On: 02/08/2022 11:21   CT Cervical Spine Wo Contrast  Result Date: 02/08/2022 CLINICAL DATA:  Unwitnessed fall EXAM: CT HEAD WITHOUT CONTRAST CT CERVICAL SPINE WITHOUT CONTRAST TECHNIQUE: Multidetector CT imaging of the head and cervical spine was performed following the standard protocol without intravenous contrast. Multiplanar CT image reconstructions of the cervical spine were also generated. RADIATION DOSE REDUCTION: This exam was performed according to the departmental dose-optimization program which includes automated exposure control, adjustment of the mA and/or kV according to patient size and/or use of iterative reconstruction technique. COMPARISON:  01/22/2022 FINDINGS: CT HEAD FINDINGS Brain: No evidence of acute infarction, hemorrhage, extra-axial collection, ventriculomegaly, or mass effect. Generalized cerebral atrophy. Periventricular white matter low attenuation likely secondary to microangiopathy. Vascular: Cerebrovascular atherosclerotic calcifications are noted. No hyperdense vessels. Skull: Negative for fracture or focal lesion. Sinuses/Orbits: Visualized portions of the orbits are unremarkable. Visualized portions of the paranasal sinuses are unremarkable. Visualized portions of the mastoid air cells are unremarkable. Other: None. CT CERVICAL SPINE FINDINGS Alignment: Loss of the normal cervical lordosis with straightening. Minimal anterolisthesis of C3 on C4 and C4 on C5. Minimal retrolisthesis of C5 on C6. Skull base and vertebrae: No acute fracture. No primary bone lesion or focal pathologic process. Soft tissues and spinal canal: No prevertebral fluid or swelling. No visible canal hematoma. Disc levels: Degenerative disease with disc height loss at C4-5, C5-6 and C6-7. At C3-4 the there is mild bilateral facet arthropathy. At C4-5 there is mild bilateral facet  arthropathy and mild right foraminal stenosis. At C5-6 there is a broad-based disc osteophyte complex, bilateral uncovertebral degenerative changes, bilateral foraminal stenosis and bilateral facet arthropathy. At C6-7 there is a broad-based disc osteophyte complex. Upper chest: Lung apices are clear. Other: No fluid collection or hematoma. IMPRESSION: 1. No acute intracranial pathology. 2.  No acute osseous injury of the cervical spine. 3. Cervical spine spondylosis as described above. Electronically Signed   By: Kathreen Devoid  M.D.   On: 02/08/2022 11:21   DG Elbow Complete Right  Result Date: 02/08/2022 CLINICAL DATA:  Status post fall, altered mental status EXAM: RIGHT ELBOW - COMPLETE 3+ VIEW COMPARISON:  None Available. FINDINGS: No acute fracture or dislocation. No aggressive osseous lesion. Normal alignment. Tiny ulnar humeral marginal osteophytes. Soft tissue are unremarkable. No radiopaque foreign body or soft tissue emphysema. IMPRESSION: 1. No acute osseous injury of the right elbow. Electronically Signed   By: Kathreen Devoid M.D.   On: 02/08/2022 10:44      Assessment  71 y.o. female with a history of chronic alcohol use, cirrhosis, HTN, diabetes, presenting with suspected gallstone pancreatitis.  Pancreatitis: suspected secondary to microlithiasis, +/- ETOH. Unfortunately, unable to complete MRI/MRCP as she refused procedure. She denies abdominal pain, N/V today. Husband at bedside. Difficult historian. Lipase improving. As LFTs improving, can pursue repeat imaging/EUS as outpatient. Surgery consultation pending.   Cirrhosis: will need outpatient follow-up, EGD/colonoscopy. Check INR to calculate MELD. Confusion today. Husband states she has had short-term memory issues for past several months. 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 Checking ammonia level today.   Anemia: Ferritin elevated at 1489 in Sept 2023, iron normal, unknown baseline but  database dating back to Aug 2023. While hospitalized in Aug 2023 at [facility], received 1 unit PRBCs due to Hgb 6.3. On admission 10.5, trending down this admission and now 7.2 but no overt GI bleeding.    Plan / Recommendations  Follow H/H Ammonia level, INR Surgery consultation pending Continue full liquids for now Needs outpatient EGD/colonoscopy Outpatient GI follow-up for cirrhosis care Abstain from ETOH    LOS: 2 days    03/08/2022, 8:15 AM  Annitta Needs, PhD, ANP-BC [facility]

## 2022-03-09 DIAGNOSIS — K851 Biliary acute pancreatitis without necrosis or infection: Secondary | ICD-10-CM | POA: Diagnosis not present

## 2022-03-09 DIAGNOSIS — R7989 Other specified abnormal findings of blood chemistry: Secondary | ICD-10-CM | POA: Diagnosis not present

## 2022-03-09 DIAGNOSIS — E876 Hypokalemia: Secondary | ICD-10-CM | POA: Diagnosis not present

## 2022-03-09 DIAGNOSIS — K7030 Alcoholic cirrhosis of liver without ascites: Secondary | ICD-10-CM | POA: Diagnosis not present

## 2022-03-09 LAB — COMPREHENSIVE METABOLIC PANEL WITH GFR
ALT: 18 U/L (ref 0–44)
AST: 30 U/L (ref 15–41)
Albumin: 1.8 g/dL — ABNORMAL LOW (ref 3.5–5.0)
Alkaline Phosphatase: 106 U/L (ref 38–126)
Anion gap: 5 (ref 5–15)
BUN: 6 mg/dL — ABNORMAL LOW (ref 8–23)
CO2: 24 mmol/L (ref 22–32)
Calcium: 8 mg/dL — ABNORMAL LOW (ref 8.9–10.3)
Chloride: 108 mmol/L (ref 98–111)
Creatinine, Ser: 0.5 mg/dL (ref 0.44–1.00)
GFR, Estimated: >60 mL/min
Glucose, Bld: 75 mg/dL (ref 70–99)
Potassium: 3.1 mmol/L — ABNORMAL LOW (ref 3.5–5.1)
Sodium: 137 mmol/L (ref 135–145)
Total Bilirubin: 0.9 mg/dL (ref 0.3–1.2)
Total Protein: 4.2 g/dL — ABNORMAL LOW (ref 6.5–8.1)

## 2022-03-09 LAB — CBC
HCT: 23.6 % — ABNORMAL LOW (ref 36.0–46.0)
Hemoglobin: 7.5 g/dL — ABNORMAL LOW (ref 12.0–15.0)
MCH: 31 pg (ref 26.0–34.0)
MCHC: 31.8 g/dL (ref 30.0–36.0)
MCV: 97.5 fL (ref 80.0–100.0)
Platelets: 172 10*3/uL (ref 150–400)
RBC: 2.42 MIL/uL — ABNORMAL LOW (ref 3.87–5.11)
RDW: 14.5 % (ref 11.5–15.5)
WBC: 4 10*3/uL (ref 4.0–10.5)
nRBC: 0 % (ref 0.0–0.2)

## 2022-03-09 MED ORDER — POTASSIUM CHLORIDE 10 MEQ/100ML IV SOLN
10.0000 meq | INTRAVENOUS | Status: AC
  Administered 2022-03-09 (×3): 10 meq via INTRAVENOUS
  Filled 2022-03-09 (×3): qty 100

## 2022-03-09 NOTE — Progress Notes (Signed)
MRCP shows significant common bile duct dilatation with evidence of choledocholithiasis.  Interestingly, she does not seem to show evidence of cholecystitis.  Given her multiple medical problems, I would prefer her having a preoperative ERCP.  Patient does have comorbidities that make her at a slight increased risk for surgical intervention.  This was discussed with Dr. Wynetta Emery.  Awaiting further input from GI.

## 2022-03-09 NOTE — Progress Notes (Addendum)
Subjective: Daughter at bedside.   Patient reports she is feeling ok aside from arm burning while receiving IV potassium. Denies abdominal pain, nausea, or vomiting. Didn't eat breakfast as she doesn't like what was offered. No overt GI bleeding though daughter states patient's husband has noticed some bright red blood in the past on toilet tissue while cleaning patient up.   No prior EGD or colonoscopy.   Last drank alcohol August 19th.   Objective: Vital signs in last 24 hours: Temp:  [97.8 F (36.6 C)-98 F (36.7 C)] 98 F (36.7 C) (10/17 0307) Pulse Rate:  [64-79] 64 (10/17 0307) Resp:  [18-19] 19 (10/17 0307) BP: (105-139)/(59-78) 112/59 (10/17 0307) SpO2:  [100 %] 100 % (10/17 0307) Last BM Date : 03/06/22 General:   Alert and oriented to self. NAD.  Head:  Normocephalic and atraumatic. Eyes:  No icterus, sclera clear. Conjuctiva pink.  Abdomen:  Bowel sounds present, soft, non-tender, non-distended. No HSM or hernias noted. No rebound or guarding. No masses appreciated  Msk:  Symmetrical without gross deformities. Normal posture. Extremities:  Without edema. Neurologic:  Alert and  oriented to self. Thinks she is in [place]. States it is 20. Doesn't know president.  Skin:  Warm and dry, intact without significant lesions.  Psych:  Normal mood and affect.  Intake/Output from previous day: 10/16 0701 - 10/17 0700 In: 340 [P.O.:340] Out: -  Intake/Output this shift: No intake/output data recorded.  Lab Results: Recent Labs    03/07/22 0451 03/08/22 0440 03/09/22 0350  WBC 7.1 4.9 4.0  HGB 8.8* 7.2* 7.5*  HCT 28.3* 22.8* 23.6*  PLT 200 164 172   BMET Recent Labs    03/07/22 0451 03/08/22 0440 03/09/22 0350  NA 136 136 137  K 3.9 3.3* 3.1*  CL 109 109 108  CO2 20* 22 24  GLUCOSE 76 72 75  BUN 11 8 6*  CREATININE 0.59 0.60 0.50  CALCIUM 8.4* 7.9* 8.0*   LFT Recent Labs    03/07/22 0451 03/08/22 0440 03/09/22 0350  PROT 4.9* 4.0* 4.2*   ALBUMIN 2.2* 1.8* 1.8*  AST 62* 34 30  ALT '27 19 18  ' ALKPHOS 146* 107 106  BILITOT 1.5* 1.2 0.9   PT/INR Recent Labs    03/06/22 1553 03/08/22 1149  LABPROT 12.8 14.8  INR 1.0 1.2    Studies/Results: MR ABDOMEN MRCP W WO CONTAST  Result Date: 03/08/2022 CLINICAL DATA:  Cholelithiasis.  Pancreatitis suspected. EXAM: MRI ABDOMEN WITHOUT AND WITH CONTRAST (INCLUDING MRCP) TECHNIQUE: Multiplanar multisequence MR imaging of the abdomen was performed both before and after the administration of intravenous contrast. Heavily T2-weighted images of the biliary and pancreatic ducts were obtained, and three-dimensional MRCP images were rendered by post processing. CONTRAST:  77m GADAVIST GADOBUTROL 1 MMOL/ML IV SOLN COMPARISON:  CTA abdomen/pelvis dated 03/06/2022. FINDINGS: Motion degraded images. Lower chest: Trace bilateral pleural effusions. Hepatobiliary: Liver is grossly unremarkable. No hepatic steatosis. No suspicious/enhancing hepatic lesions. Layering gallstones (series 9/image 15), without gallbladder wall thickening or pericholecystic inflammatory changes to suggest acute cholecystitis. Mild central intrahepatic ductal dilatation (series 4/image 11). Dilated common duct, measuring 14 mm (series 3/image 13). Suspected tiny layering gallstones in the distal CBD (series 13/image 13). Pancreas: Trace peripancreatic fluid along the posterior pancreatic head/uncinate process (series 4/image 19), although improved from prior CT. No drainable fluid collection/pseudocyst. Spleen:  Within normal limits. Adrenals/Urinary Tract:  Adrenal glands are within normal limits. Kidneys are within normal limits.  No hydronephrosis. Stomach/Bowel: Stomach is  within normal limits. Visualized bowel is notable for right colonic diverticulosis, without associated inflammatory changes. Vascular/Lymphatic:  No evidence of abdominal aortic aneurysm. No suspicious abdominal lymphadenopathy. Other:  No abdominal ascites.  Musculoskeletal: Mild degenerative changes of the lumbar spine with dextroscoliosis. IMPRESSION: Motion degraded images. Cholelithiasis, without evidence of acute cholecystitis. Mild central intrahepatic and extrahepatic ductal dilatation. Dilated common duct, measuring 14 mm. Suspected choledocholithiasis with tiny layering gallstones in the distal CBD. Consider ERCP as clinically warranted. Trace peripancreatic fluid along the posterior pancreatic head/uncinate process, suggesting mild acute pancreatitis, although improved from prior CT. No associated complication. Electronically Signed   By: Julian Hy M.D.   On: 03/08/2022 19:58   MR 3D Recon At Scanner  Result Date: 03/08/2022 CLINICAL DATA:  Cholelithiasis.  Pancreatitis suspected. EXAM: MRI ABDOMEN WITHOUT AND WITH CONTRAST (INCLUDING MRCP) TECHNIQUE: Multiplanar multisequence MR imaging of the abdomen was performed both before and after the administration of intravenous contrast. Heavily T2-weighted images of the biliary and pancreatic ducts were obtained, and three-dimensional MRCP images were rendered by post processing. CONTRAST:  41m GADAVIST GADOBUTROL 1 MMOL/ML IV SOLN COMPARISON:  CTA abdomen/pelvis dated 03/06/2022. FINDINGS: Motion degraded images. Lower chest: Trace bilateral pleural effusions. Hepatobiliary: Liver is grossly unremarkable. No hepatic steatosis. No suspicious/enhancing hepatic lesions. Layering gallstones (series 9/image 15), without gallbladder wall thickening or pericholecystic inflammatory changes to suggest acute cholecystitis. Mild central intrahepatic ductal dilatation (series 4/image 11). Dilated common duct, measuring 14 mm (series 3/image 13). Suspected tiny layering gallstones in the distal CBD (series 13/image 13). Pancreas: Trace peripancreatic fluid along the posterior pancreatic head/uncinate process (series 4/image 19), although improved from prior CT. No drainable fluid collection/pseudocyst. Spleen:   Within normal limits. Adrenals/Urinary Tract:  Adrenal glands are within normal limits. Kidneys are within normal limits.  No hydronephrosis. Stomach/Bowel: Stomach is within normal limits. Visualized bowel is notable for right colonic diverticulosis, without associated inflammatory changes. Vascular/Lymphatic:  No evidence of abdominal aortic aneurysm. No suspicious abdominal lymphadenopathy. Other:  No abdominal ascites. Musculoskeletal: Mild degenerative changes of the lumbar spine with dextroscoliosis. IMPRESSION: Motion degraded images. Cholelithiasis, without evidence of acute cholecystitis. Mild central intrahepatic and extrahepatic ductal dilatation. Dilated common duct, measuring 14 mm. Suspected choledocholithiasis with tiny layering gallstones in the distal CBD. Consider ERCP as clinically warranted. Trace peripancreatic fluid along the posterior pancreatic head/uncinate process, suggesting mild acute pancreatitis, although improved from prior CT. No associated complication. Electronically Signed   By: SJulian HyM.D.   On: 03/08/2022 19:58    Assessment: 71y.o. female with a history of chronic alcohol use, cirrhosis, HTN, diabetes, presenting with suspected gallstone pancreatitis.   Pancreatitis:  Suspected secondary to microlithiasis, +/- ETOH.  Initially unable to complete MRI/MRCP as patient refused.  MRI/MRCP completed yesterday evening revealing cholelithiasis without cholecystitis, mild central intrahepatic extrahepatic duct dilation with CBD measuring 14 mm, suspected choledocholithiasis with tiny layering gallstones in the distal CBD, trace peripancreatic fluid along posterior pancreatic head/uncinate process suggesting mild acute pancreatitis, improved from prior CT.  Initially AST, alk phos, and T. bili elevated but normalized on 10/16 and remain normal today. Clinically, she is doing well without abdominal pain, nausea, or vomiting. White count is normal. Afebrile. General  surgery has recommended preoperative ERCP.  Cirrhosis:  Slight nodular hepatic contour suggesting cirrhosis noted on CT A/P without contrast in August [facility] at [facility]  CT angio abdomen and pelvis as well as MRI/MRCP with and without contrast this admission with normal-appearing liver.  Spleen is also normal.  Platelets are normal. It is possible that first CT was an overcall.  MELD 3.0 was 14 yesterday. She will need outpatient follow-up, EGD for variceal screening/evaluation for portal hypertension. She has had some confusion, but ammonia wnl. Husband states she has had short-term memory issues for past several months. 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 Otherwise, no symptoms of decompensation.   Anemia:  On admission 10.5, trending down this admission and now 7.5, improved from 7.2 yesterday.  Ferritin elevated at 676, iron 55, saturation 51%, folate within normal limits, B12 elevated.  No overt GI bleeding.  Unknown baseline as database only dates back to August 2023. While hospitalized in Aug 2023 at [facility], received 1 unit PRBCs due to Hgb 6.3.  Ultimately, she needs EGD for variceal screening/evaluation of portal hypertensive gastropathy as well as first-ever colonoscopy which will also help evaluate her anemia.  This can be completed outpatient.  Plan: Needs transfer to [facility] for ERCP and subsequent cholecystectomy.  Will continue full liquid diet for now pending transfer to [facility].  Follow H/H and monitor for overt GI bleeding.  Needs outpatient EGD/Colonoscopy.  Outpatient cirrhosis care.  Continue to abstain from alcohol. Continue supportive care.    LOS: 3 days    03/09/2022, 9:12 AM   Aliene Altes, [facility]

## 2022-03-09 NOTE — Assessment & Plan Note (Signed)
-  Hg stable at 7.5 today

## 2022-03-09 NOTE — Progress Notes (Signed)
Pt arrived to unit, assessed, all needs addressed 

## 2022-03-09 NOTE — Progress Notes (Signed)
PROGRESS NOTE   Maria Dunlap  MPN:[id number] DOB: 06-05-1950 DOA: 03/06/2022 PCP: Sonny Masters, FNP   Chief Complaint  Patient presents with   Abdominal Pain   Level of care: Med-Surg  Brief Admission History:  71 y.o. female with medical history significant for alcohol abuse, liver cirrhosis, hypertension, prediabetes.  Patient presented to the ED with complaints of abdominal pain started about 11 this morning, with 3 episodes of vomiting.  She also reports multiple loose stools yesterday.  She reports pain with urination that started today.  She reports she has not drunk any alcoholic beverage in at least 6 weeks.  Spouse is at bedside.  Patient was also treated for UTI about 2-1/2 weeks ago, she completed 7 days of an antibiotic- ? Name.    Recent hospitalization 9/1 to 9/8 for hypernatremia- 151 and acute metabolic encephalopathy.  MRI brain CT negative for acute abnormality.  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   ED Course: Stable vitals.  Lipase 1643.  Mild elevation in liver enzymes - AST 130, ALT 31, ALP 178, total bilirubin 2.  Lactic acidosis 3.6 > 3.5.  Potassium 3.2.  Magnesium 1.5.  CT abdomen and pelvis with contrast-patent abdominal vasculature, suggests acute pancreatitis, secondary duodenitis, cholelithiasis without acute cholecystitis, cystitis, and mildly dilated common duct, consider MRCP or ERCP.  EDP talked to Dr. Marletta Lor, recommended consulting [facility]. EDP talked with Dr. Myrtie Neither on-call for GI, recommended patient stay here at [facility], patient likely passed stone, transferred to [facility] on Monday if ERCP MRCP still needed.   Assessment and Plan: * Acute gallstone pancreatitis Presenting with abdominal pain vomiting and diarrhea yesterday.  Lipase elevated at 1643, minimal liver enzyme elevation, AST 130, and ALP of  178, total bilirubin 2.  ALT WNL- 31.  CT findings-just an acute pancreatitis, secondary to duodenitis.  No associated complication.  Also shows cholelithiasis, mildly dilated common bile duct- consider ERCP or MRCP to exclude choledocholithiasis as clinically warranted. -  EDP talked to Dr. Marletta Lor, recommended consulting [facility]. EDP talked with Dr. Myrtie Neither on-call for GI, recommended patient stay here at [facility], patient likely passed stone, transfer to [facility] on Monday-10/16 if ERCP needed -Patient refused MRCP initially but after extensive counseling with family she had the procedure completed on 10/16. -MRCP with CBD stone 14 mm -recommendation from surgery and GI is for transfer to [facility] for ERCP -GI service to reach out to GI at [facility] for ERCP as per our service line agreement -I have updated and signed out to [facility] service at [facility] about transfer  -[facility] notified of transfer and need for ERCP by our GI service    Elevated lactic acid level -treated and resolved with IV fluid hydration   Lower urinary tract infectious disease Reports dysuria today, rule out for sepsis, afebrile, WBC 10.4.  Lactic acidosis of 3.6 > 3.5 likely from dehydration, pancreatitis,  underlying liver cirrhosis and likely poor clearance also.  Reports completing 7 day course of antibiotics about 2-1/2 weeks ago.  Recent urine cultures growing greater than 100,000 colonies of yeast. -Follow-up urine cultures -Continue with IV ceftriaxone 1 g daily x 3 doses  Prolonged QT interval QTc 536.  Not on QT prolonging medications. -Repleted electrolytes  Cirrhosis of liver without ascites (HCC) History of alcohol abuse.  Last alcoholic beverage 6 weeks ago at least.  Has several small  purpura to bilateral lower extremities.  Platelets within normal at 263. - PT/INR WNL - ammonia level <10   Anemia -Hg stable at 7.5 today  Prediabetes - hgba1c 4.0 I would say the prediabetes is resolved  now  Hypokalemia Potassium 3.2.  Also hypomagnesemia 1.5.  Likely from vomiting and diarrhea. -IV KCl ordered, Mg 2.2 -recheck in AM   DVT prophylaxis: enoxaparin Code Status: Full  Family Communication: husband bedside  Disposition: Status is: Inpatient Remains inpatient appropriate because: IV fluids required    Consultants:  GI  Procedures:   Antimicrobials:  Ceftriaxone 10/14>>  Subjective: Pt remains confused but this is her baseline.  Tolerating liquid diet.  Abd pain is much improved.   Objective: Vitals:   03/08/22 0638 03/08/22 1354 03/08/22 2219 03/09/22 0307  BP: 130/76 139/78 105/73 (!) 112/59  Pulse: 73 79 74 64  Resp: 19 18 18 19   Temp: (!) 97.5 F (36.4 C) 97.8 F (36.6 C) 98 F (36.7 C) 98 F (36.7 C)  TempSrc:  Oral    SpO2: 100% 100% 100% 100%  Weight:      Height:        Intake/Output Summary (Last 24 hours) at 03/09/2022 1300 Last data filed at 03/09/2022 0500 Gross per 24 hour  Intake 340 ml  Output --  Net 340 ml   Filed Weights   03/06/22 1821  Weight: 51.5 kg   Examination:  General exam: elderly frail female, awake, alert, confused, Appears calm and comfortable  Respiratory system: Clear to auscultation. Respiratory effort normal. Cardiovascular system: normal S1 & S2 heard. No JVD, murmurs, rubs, gallops or clicks. No pedal edema. Gastrointestinal system: Abdomen is nondistended, soft and mild epigastric tenderness nearly resolved. No organomegaly or masses felt. Normal bowel sounds heard. Central nervous system: Alert and oriented. No focal neurological deficits. Extremities: Symmetric 5 x 5 power. Skin: No rashes, lesions or ulcers. Psychiatry: Judgement and insight appear poor. Mood & affect appropriate.   Data Reviewed: I have personally reviewed following labs and imaging studies  CBC: Recent Labs  Lab 03/06/22 1530 03/07/22 0451 03/08/22 0440 03/09/22 0350  WBC 10.4 7.1 4.9 4.0  NEUTROABS 8.0*  --   --   --   HGB  10.5* 8.8* 7.2* 7.5*  HCT 32.3* 28.3* 22.8* 23.6*  MCV 95.6 98.3 97.4 97.5  PLT 263 200 164 259    Basic Metabolic Panel: Recent Labs  Lab 03/06/22 1530 03/07/22 0451 03/08/22 0440 03/09/22 0350  NA 136 136 136 137  K 3.2* 3.9 3.3* 3.1*  CL 106 109 109 108  CO2 18* 20* 22 24  GLUCOSE 150* 76 72 75  BUN 11 11 8  6*  CREATININE 0.78 0.59 0.60 0.50  CALCIUM 9.0 8.4* 7.9* 8.0*  MG 1.5* 2.2  --   --     CBG: Recent Labs  Lab 03/07/22 0045  GLUCAP 91    Recent Results (from the past 240 hour(s))  Culture, blood (single)     Status: None (Preliminary result)   Collection Time: 03/06/22  4:49 PM   Specimen: BLOOD  Result Value Ref Range Status   Specimen Description   Final    BLOOD RIGHT ANTECUBITAL BOTTLES DRAWN AEROBIC AND ANAEROBIC   Special Requests   Final    Blood Culture results may not be optimal due to an excessive volume of blood received in culture bottles   Culture   Final    NO GROWTH 2 DAYS Performed at [facility], [address], [place], [place] [address]    Report Status PENDING  Incomplete  Urine Culture     Status: None   Collection Time: 03/06/22  5:52 PM   Specimen: Urine, Catheterized  Result Value Ref Range Status   Specimen Description   Final    URINE, CATHETERIZED Performed at [facility], [address] [place], [place] [address]    Special Requests   Final    NONE Performed at [facility], [address], [place], [place] [address]    Culture   Final    NO GROWTH Performed at [facility], [address] [place], [place] [address]    Report Status 03/08/2022 FINAL  Final     Radiology Studies: MR ABDOMEN MRCP W WO CONTAST  Result Date: 03/08/2022 CLINICAL DATA:  Cholelithiasis.  Pancreatitis suspected. EXAM: MRI ABDOMEN WITHOUT AND WITH CONTRAST (INCLUDING MRCP) TECHNIQUE: Multiplanar multisequence MR imaging of the abdomen was performed both before and after the administration of intravenous contrast. Heavily  T2-weighted images of the biliary and pancreatic ducts were obtained, and three-dimensional MRCP images were rendered by post processing. CONTRAST:  19mL GADAVIST GADOBUTROL 1 MMOL/ML IV SOLN COMPARISON:  CTA abdomen/pelvis dated 03/06/2022. FINDINGS: Motion degraded images. Lower chest: Trace bilateral pleural effusions. Hepatobiliary: Liver is grossly unremarkable. No hepatic steatosis. No suspicious/enhancing hepatic lesions. Layering gallstones (series 9/image 15), without gallbladder wall thickening or pericholecystic inflammatory changes to suggest acute cholecystitis. Mild central intrahepatic ductal dilatation (series 4/image 11). Dilated common duct, measuring 14 mm (series 3/image 13). Suspected tiny layering gallstones in the distal CBD (series 13/image 13). Pancreas: Trace peripancreatic fluid along the posterior pancreatic head/uncinate process (series 4/image 19), although improved from prior CT. No drainable fluid collection/pseudocyst. Spleen:  Within normal limits. Adrenals/Urinary Tract:  Adrenal glands are within normal limits. Kidneys are within normal limits.  No hydronephrosis. Stomach/Bowel: Stomach is within normal limits. Visualized bowel is notable for right colonic diverticulosis, without associated inflammatory changes. Vascular/Lymphatic:  No evidence of abdominal aortic aneurysm. No suspicious abdominal lymphadenopathy. Other:  No abdominal ascites. Musculoskeletal: Mild degenerative changes of the lumbar spine with dextroscoliosis. IMPRESSION: Motion degraded images. Cholelithiasis, without evidence of acute cholecystitis. Mild central intrahepatic and extrahepatic ductal dilatation. Dilated common duct, measuring 14 mm. Suspected choledocholithiasis with tiny layering gallstones in the distal CBD. Consider ERCP as clinically warranted. Trace peripancreatic fluid along the posterior pancreatic head/uncinate process, suggesting mild acute pancreatitis, although improved from prior CT. No  associated complication. Electronically Signed   By: Charline Bills M.D.   On: 03/08/2022 19:58   MR 3D Recon At Scanner  Result Date: 03/08/2022 CLINICAL DATA:  Cholelithiasis.  Pancreatitis suspected. EXAM: MRI ABDOMEN WITHOUT AND WITH CONTRAST (INCLUDING MRCP) TECHNIQUE: Multiplanar multisequence MR imaging of the abdomen was performed both before and after the administration of intravenous contrast. Heavily T2-weighted images of the biliary and pancreatic ducts were obtained, and three-dimensional MRCP images were rendered by post processing. CONTRAST:  6mL GADAVIST GADOBUTROL 1 MMOL/ML IV SOLN COMPARISON:  CTA abdomen/pelvis dated 03/06/2022. FINDINGS: Motion degraded images. Lower chest: Trace bilateral pleural effusions. Hepatobiliary: Liver is grossly unremarkable. No hepatic steatosis. No suspicious/enhancing hepatic lesions. Layering gallstones (series 9/image 15), without gallbladder wall thickening or pericholecystic inflammatory changes to suggest acute cholecystitis. Mild central intrahepatic ductal dilatation (series 4/image 11). Dilated common duct, measuring 14 mm (series 3/image 13). Suspected tiny layering gallstones in the distal CBD (series 13/image 13). Pancreas: Trace peripancreatic fluid along the posterior pancreatic head/uncinate process (series 4/image 19), although  improved from prior CT. No drainable fluid collection/pseudocyst. Spleen:  Within normal limits. Adrenals/Urinary Tract:  Adrenal glands are within normal limits. Kidneys are within normal limits.  No hydronephrosis. Stomach/Bowel: Stomach is within normal limits. Visualized bowel is notable for right colonic diverticulosis, without associated inflammatory changes. Vascular/Lymphatic:  No evidence of abdominal aortic aneurysm. No suspicious abdominal lymphadenopathy. Other:  No abdominal ascites. Musculoskeletal: Mild degenerative changes of the lumbar spine with dextroscoliosis. IMPRESSION: Motion degraded images.  Cholelithiasis, without evidence of acute cholecystitis. Mild central intrahepatic and extrahepatic ductal dilatation. Dilated common duct, measuring 14 mm. Suspected choledocholithiasis with tiny layering gallstones in the distal CBD. Consider ERCP as clinically warranted. Trace peripancreatic fluid along the posterior pancreatic head/uncinate process, suggesting mild acute pancreatitis, although improved from prior CT. No associated complication. Electronically Signed   By: Charline Bills M.D.   On: 03/08/2022 19:58    Scheduled Meds:  enoxaparin (LOVENOX) injection  40 mg Subcutaneous Q24H   melatonin  6 mg Oral QHS   Continuous Infusions:  lactated ringers 30 mL/hr at 03/09/22 0844     LOS: 3 days   Time spent: 35 mins  Coreyon Nicotra Laural Benes, MD How to contact the [facility] Attending or Consulting provider 7A - 7P or covering provider during after hours 7P -7A, for this patient?  Check the care team in [facility] and look for a) attending/consulting TR[facility] provider listed and b) the [facility] team listed Log into [email or web address] and use [facility]'s universal password to access. If you do not have the password, please contact the hospital operator. Locate the [facility] provider you are looking for under [facility] and page to a number that you can be directly reached. If you still have difficulty reaching the provider, please page the [facility] (Director on Call) for the Hospitalists listed on amion for assistance.  03/09/2022, 1:00 PM

## 2022-03-09 NOTE — Progress Notes (Signed)
Report given to carelink and to nurse on 2W.  Husband present and aware of transfer. Patient alert and vitals stable at time of transfer.

## 2022-03-09 NOTE — Progress Notes (Signed)
Patient has had no c/o pain or discomfort at this time. Up to [facility] with assist. Patient is waiting for bed placement at [facility], the transfer is for ERCP. Family is at bedside. Plan of care on going.

## 2022-03-09 NOTE — Assessment & Plan Note (Signed)
-  treated and resolved with IV fluid hydration

## 2022-03-09 NOTE — Progress Notes (Signed)
Pt oriented x2, very confused and slightly agitated. Pt refused lovenox injection. Significant other stayed overnight, supportive and appears to help calm her. New skin tear on left anterior forearm 1.5 x 0.3 cm. Covered with bandage. Vitals stable.

## 2022-03-10 DIAGNOSIS — R7989 Other specified abnormal findings of blood chemistry: Secondary | ICD-10-CM | POA: Diagnosis not present

## 2022-03-10 DIAGNOSIS — K7030 Alcoholic cirrhosis of liver without ascites: Secondary | ICD-10-CM | POA: Diagnosis not present

## 2022-03-10 DIAGNOSIS — E876 Hypokalemia: Secondary | ICD-10-CM | POA: Diagnosis not present

## 2022-03-10 DIAGNOSIS — K851 Biliary acute pancreatitis without necrosis or infection: Secondary | ICD-10-CM | POA: Diagnosis not present

## 2022-03-10 LAB — COMPREHENSIVE METABOLIC PANEL
ALT: 16 U/L (ref 0–44)
AST: 28 U/L (ref 15–41)
Albumin: 1.7 g/dL — ABNORMAL LOW (ref 3.5–5.0)
Alkaline Phosphatase: 96 U/L (ref 38–126)
Anion gap: 7 (ref 5–15)
BUN: <5 mg/dL — ABNORMAL LOW (ref 8–23)
CO2: 24 mmol/L (ref 22–32)
Calcium: 8.1 mg/dL — ABNORMAL LOW (ref 8.9–10.3)
Chloride: 107 mmol/L (ref 98–111)
Creatinine, Ser: 0.61 mg/dL (ref 0.44–1.00)
GFR, Estimated: >60 mL/min (ref >60)
Glucose, Bld: 86 mg/dL (ref 70–99)
Potassium: 3.3 mmol/L — ABNORMAL LOW (ref 3.5–5.1)
Sodium: 138 mmol/L (ref 135–145)
Total Bilirubin: 0.7 mg/dL (ref 0.3–1.2)
Total Protein: 3.9 g/dL — ABNORMAL LOW (ref 6.5–8.1)

## 2022-03-10 LAB — SURGICAL PCR SCREEN
MRSA, PCR: POSITIVE — AB
Staphylococcus aureus: POSITIVE — AB

## 2022-03-10 MED ORDER — POTASSIUM CHLORIDE 20 MEQ PO PACK
40.0000 meq | PACK | Freq: Two times a day (BID) | ORAL | Status: AC
  Administered 2022-03-10 (×2): 40 meq via ORAL
  Filled 2022-03-10 (×2): qty 2

## 2022-03-10 NOTE — Progress Notes (Addendum)
[facility] PROGRESS NOTE    Progress Note  Maria Dunlap  XBW:[id number] DOB: 06-Jun-1950 DOA: 03/06/2022 PCP: Sonny Masters, FNP     Brief Narrative:   Maria Dunlap is an 71 y.o. female past medical history significant for alcohol abuse, comes into the clinic with abdominal pain and vomiting along with multiple experience.  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  Gallstone pancreatitis GI was consulted who recommended transfer to [facility] possible ERCP, MRCP done that showed common bile duct with a 14 mm stone.    Assessment/Plan:   Acute gallstone pancreatitis CT findings suggestive of acute pancreatitis MRCP confirmed CBD stone of 14 mm. General surgery GI was consulted recommended transfer to [facility]. GI recommended ERCP scheduled for 03/12/2022. Continue current diet.  Elevated lactic acid borderline elevated pancreatitis she is not septic.  Lower UTI: She reported dysuria, she reported he was on a course of antibiotics about 2-1/2 weeks ago. Was started on Rocephin for 3 days. Urine cultures were sent which are negative.  Prolonged QTc: Try the potassium greater than 4 magnesium greater than 2.   Normocytic anemia: Hemoglobin has remained relatively stable at 7.5.  Hypokalemia: Potassium still low at 3.3, was repleted IV with minimal success replete orally recheck tomorrow morning.    DVT prophylaxis: lovenox Family Communication:daughter Status is: Inpatient Remains inpatient appropriate because: Acute gallstone hepatitis.    Code Status:     Code Status Orders  (From admission, onward)           Start     Ordered   03/06/22 2102  Full code  Continuous        03/06/22 2104           Code Status History      Date Active Date Inactive Code Status Order ID Comments User Context   01/22/2022 2029 01/29/2022 1808 Full Code [id number]  Elgergawy, Leana Roe, MD Inpatient         IV Access:   Peripheral IV   Procedures and diagnostic studies:   MR ABDOMEN MRCP W WO CONTAST  Result Date: 03/08/2022 CLINICAL DATA:  Cholelithiasis.  Pancreatitis suspected. EXAM: MRI ABDOMEN WITHOUT AND WITH CONTRAST (INCLUDING MRCP) TECHNIQUE: Multiplanar multisequence MR imaging of the abdomen was performed both before and after the administration of intravenous contrast. Heavily T2-weighted images of the biliary and pancreatic ducts were obtained, and three-dimensional MRCP images were rendered by post processing. CONTRAST:  34mL GADAVIST GADOBUTROL 1 MMOL/ML IV SOLN COMPARISON:  CTA abdomen/pelvis dated 03/06/2022. FINDINGS: Motion degraded images. Lower chest: Trace bilateral pleural effusions. Hepatobiliary: Liver is grossly unremarkable. No hepatic steatosis. No suspicious/enhancing hepatic lesions. Layering gallstones (series 9/image 15), without gallbladder wall thickening or pericholecystic inflammatory changes to suggest acute cholecystitis. Mild central intrahepatic ductal dilatation (series 4/image 11). Dilated common duct, measuring 14 mm (series 3/image 13). Suspected tiny layering gallstones in the distal CBD (series 13/image 13). Pancreas: Trace peripancreatic fluid along the posterior pancreatic head/uncinate process (series 4/image 19), although improved from prior CT. No drainable fluid collection/pseudocyst. Spleen:  Within normal limits. Adrenals/Urinary Tract:  Adrenal glands are within normal limits. Kidneys are within normal limits.  No hydronephrosis. Stomach/Bowel: Stomach is within normal limits. Visualized bowel is notable for right colonic diverticulosis, without associated inflammatory changes. Vascular/Lymphatic:  No evidence of abdominal aortic  aneurysm. No suspicious abdominal lymphadenopathy. Other:   No abdominal ascites. Musculoskeletal: Mild degenerative changes of the lumbar spine with dextroscoliosis. IMPRESSION: Motion degraded images. Cholelithiasis, without evidence of acute cholecystitis. Mild central intrahepatic and extrahepatic ductal dilatation. Dilated common duct, measuring 14 mm. Suspected choledocholithiasis with tiny layering gallstones in the distal CBD. Consider ERCP as clinically warranted. Trace peripancreatic fluid along the posterior pancreatic head/uncinate process, suggesting mild acute pancreatitis, although improved from prior CT. No associated complication. Electronically Signed   By: Charline Bills M.D.   On: 03/08/2022 19:58   MR 3D Recon At Scanner  Result Date: 03/08/2022 CLINICAL DATA:  Cholelithiasis.  Pancreatitis suspected. EXAM: MRI ABDOMEN WITHOUT AND WITH CONTRAST (INCLUDING MRCP) TECHNIQUE: Multiplanar multisequence MR imaging of the abdomen was performed both before and after the administration of intravenous contrast. Heavily T2-weighted images of the biliary and pancreatic ducts were obtained, and three-dimensional MRCP images were rendered by post processing. CONTRAST:  54mL GADAVIST GADOBUTROL 1 MMOL/ML IV SOLN COMPARISON:  CTA abdomen/pelvis dated 03/06/2022. FINDINGS: Motion degraded images. Lower chest: Trace bilateral pleural effusions. Hepatobiliary: Liver is grossly unremarkable. No hepatic steatosis. No suspicious/enhancing hepatic lesions. Layering gallstones (series 9/image 15), without gallbladder wall thickening or pericholecystic inflammatory changes to suggest acute cholecystitis. Mild central intrahepatic ductal dilatation (series 4/image 11). Dilated common duct, measuring 14 mm (series 3/image 13). Suspected tiny layering gallstones in the distal CBD (series 13/image 13). Pancreas: Trace peripancreatic fluid along the posterior pancreatic head/uncinate process (series 4/image 19), although improved from prior CT. No drainable fluid  collection/pseudocyst. Spleen:  Within normal limits. Adrenals/Urinary Tract:  Adrenal glands are within normal limits. Kidneys are within normal limits.  No hydronephrosis. Stomach/Bowel: Stomach is within normal limits. Visualized bowel is notable for right colonic diverticulosis, without associated inflammatory changes. Vascular/Lymphatic:  No evidence of abdominal aortic aneurysm. No suspicious abdominal lymphadenopathy. Other:  No abdominal ascites. Musculoskeletal: Mild degenerative changes of the lumbar spine with dextroscoliosis. IMPRESSION: Motion degraded images. Cholelithiasis, without evidence of acute cholecystitis. Mild central intrahepatic and extrahepatic ductal dilatation. Dilated common duct, measuring 14 mm. Suspected choledocholithiasis with tiny layering gallstones in the distal CBD. Consider ERCP as clinically warranted. Trace peripancreatic fluid along the posterior pancreatic head/uncinate process, suggesting mild acute pancreatitis, although improved from prior CT. No associated complication. Electronically Signed   By: Charline Bills M.D.   On: 03/08/2022 19:58     Medical Consultants:   None.   Subjective:    [name] pain is controlled tolerating her diet.  Objective:    Vitals:   03/09/22 2220 03/09/22 2328 03/10/22 0342 03/10/22 0739  BP: 122/77 131/80 120/73 (!) 101/58  Pulse: 67 70 66 69  Resp: 17 16 16 18   Temp: (!) 96.5 F (35.8 C) 98.3 F (36.8 C) 98 F (36.7 C) 98.5 F (36.9 C)  TempSrc:  Oral Oral Oral  SpO2: 100% 100% 99% 100%  Weight:      Height:       SpO2: 100 %   Intake/Output Summary (Last 24 hours) at 03/10/2022 1033 Last data filed at 03/09/2022 1700 Gross per 24 hour  Intake 1676.98 ml  Output --  Net 1676.98 ml   Filed Weights   03/06/22 1821  Weight: 51.5 kg    Exam: General exam: In no acute distress. Respiratory system: Good air movement and clear to auscultation. Cardiovascular system: S1 & S2 heard, RRR.  No JVD,. Gastrointestinal system: Abdomen is nondistended, soft and nontender.  Extremities: No pedal edema. Skin: No rashes,  lesions or ulcers Psychiatry: Judgement and insight appear normal. Mood & affect appropriate.    Data Reviewed:    Labs: Basic Metabolic Panel: Recent Labs  Lab 03/06/22 1530 03/07/22 0451 03/08/22 0440 03/09/22 0350 03/10/22 0433  NA 136 136 136 137 138  K 3.2* 3.9 3.3* 3.1* 3.3*  CL 106 109 109 108 107  CO2 18* 20* 22 24 24   GLUCOSE 150* 76 72 75 86  BUN 11 11 8  6* <5*  CREATININE 0.78 0.59 0.60 0.50 0.61  CALCIUM 9.0 8.4* 7.9* 8.0* 8.1*  MG 1.5* 2.2  --   --   --    GFR Estimated Creatinine Clearance: 47.3 mL/min (by C-G formula based on SCr of 0.61 mg/dL). Liver Function Tests: Recent Labs  Lab 03/06/22 1530 03/07/22 0451 03/08/22 0440 03/09/22 0350 03/10/22 0433  AST 130* 62* 34 30 28  ALT 31 27 19 18 16   ALKPHOS 178* 146* 107 106 96  BILITOT 2.0* 1.5* 1.2 0.9 0.7  PROT 6.1* 4.9* 4.0* 4.2* 3.9*  ALBUMIN 2.7* 2.2* 1.8* 1.8* 1.7*   Recent Labs  Lab 03/06/22 1530 03/07/22 0451 03/08/22 0440  LIPASE 1,643* 261* 54*   Recent Labs  Lab 03/08/22 1149  AMMONIA <10   Coagulation profile Recent Labs  Lab 03/06/22 1553 03/08/22 1149  INR 1.0 1.2   COVID-19 Labs  Recent Labs    03/08/22 1444  FERRITIN 676*    No results found for: "SARSCOV2NAA"  CBC: Recent Labs  Lab 03/06/22 1530 03/07/22 0451 03/08/22 0440 03/09/22 0350  WBC 10.4 7.1 4.9 4.0  NEUTROABS 8.0*  --   --   --   HGB 10.5* 8.8* 7.2* 7.5*  HCT 32.3* 28.3* 22.8* 23.6*  MCV 95.6 98.3 97.4 97.5  PLT 263 200 164 172   Cardiac Enzymes: No results for input(s): "CKTOTAL", "CKMB", "CKMBINDEX", "TROPONINI" in the last 168 hours. BNP (last 3 results) No results for input(s): "PROBNP" in the last 8760 hours. CBG: Recent Labs  Lab 03/07/22 0045  GLUCAP 91   D-Dimer: No results for input(s): "DDIMER" in the last 72 hours. Hgb A1c: No results for  input(s): "HGBA1C" in the last 72 hours. Lipid Profile: No results for input(s): "CHOL", "HDL", "LDLCALC", "TRIG", "CHOLHDL", "LDLDIRECT" in the last 72 hours. Thyroid function studies: No results for input(s): "TSH", "T4TOTAL", "T3FREE", "THYROIDAB" in the last 72 hours.  Invalid input(s): "FREET3" Anemia work up: Recent Labs    03/08/22 1444  VITAMINB12 1,222*  FOLATE 11.2  FERRITIN 676*  TIBC 108*  IRON 55  RETICCTPCT 3.2*   Sepsis Labs: Recent Labs  Lab 03/06/22 1530 03/06/22 1540 03/06/22 1743 03/06/22 2318 03/07/22 0451 03/08/22 0440 03/09/22 0350  WBC 10.4  --   --   --  7.1 4.9 4.0  LATICACIDVEN  --  3.6* 3.5* 1.9  --   --   --    Microbiology Recent Results (from the past 240 hour(s))  Culture, blood (single)     Status: None (Preliminary result)   Collection Time: 03/06/22  4:49 PM   Specimen: BLOOD  Result Value Ref Range Status   Specimen Description   Final    BLOOD RIGHT ANTECUBITAL BOTTLES DRAWN AEROBIC AND ANAEROBIC   Special Requests   Final    Blood Culture results may not be optimal due to an excessive volume of blood received in culture bottles   Culture   Final    NO GROWTH 4 DAYS Performed at [facility] [address],  [place], [place] [address]    Report Status PENDING  Incomplete  Urine Culture     Status: None   Collection Time: 03/06/22  5:52 PM   Specimen: Urine, Catheterized  Result Value Ref Range Status   Specimen Description   Final    URINE, CATHETERIZED Performed at [facility], [address] [place], [place] [address]    Special Requests   Final    NONE Performed at [facility], [address] [place], [place] [address]    Culture   Final    NO GROWTH Performed at [facility], [address] [place], [place] [address]    Report Status 03/08/2022 FINAL  Final     Medications:    enoxaparin (LOVENOX) injection  40 mg Subcutaneous Q24H   melatonin  6 mg Oral QHS   Continuous Infusions:  lactated  ringers 30 mL/hr at 03/09/22 2355      LOS: 4 days   Marinda Elk  [facility]  03/10/2022, 10:33 AM

## 2022-03-10 NOTE — Progress Notes (Signed)
Mobility Specialist Progress Note:   03/10/22 1119  Mobility  Activity Dangled on edge of bed  Level of Assistance Independent  Assistive Device None  Activity Response Tolerated well  Mobility Referral Yes  $Mobility charge 1 Mobility   Pt received in bed and agreeable. C/o of pain in feet, no rating given. Declined further mobility today d/t pain in feet. Pt left in bed with all needs met, call bell in reach, and daughter in room.   Rohini Jaroszewski Mobility Specialist-Acute Rehab Secure Chat only

## 2022-03-10 NOTE — Care Management Important Message (Signed)
Important Message  Patient Details  Name: Maria Dunlap MRN: [id number] Date of Birth: [birth year]   Medicare Important Message Given:  Yes     Orbie Pyo 03/10/2022, 2:44 PM

## 2022-03-10 NOTE — Consult Note (Addendum)
[facility] Consult: 8:33 AM 03/10/2022  LOS: 4 days    Referring Provider: Dr Olevia Bowens  Primary Care Physician:  Baruch Gouty, [place] Primary Gastroenterologist:  Dr Abbey Chatters    Reason for Consultation:  CBD stones   HPI: Maria Dunlap is a 71 y.o. female.  PMH GERD.  Cirrhosis (nodular liver contour on 12/2021 CT).  Alcohol abuse.  Diabetes.  Eczema.   Anemia (Hgb 7.3 to 8.7  in early sept).  previous hysterectomy.  Admission in mid August at [facility] for hyponatremia, weakness, confusion, cystitis, alcohol abuse, macrocytic anemia, hypokalemia, debility.  It was at this time she was diagnosed with cirrhosis.  She was discharged to skilled rehab.  Admission for a week in early September for metabolic encephalopathy attributed to [facility], improved with thiamine.  Also treated for hypernatremia (152).    Transfer from [facility] overnight.  Presented there several days ago with RUQ pain, nausea, vomiting and elevated LFTs.  CT confirmed likely acute pancreatitis, lipase 1643.  Also showed duodenitis.  CBD 12 mm.  Had cholelithiasis but no cholecystitis.  Urinary bladder asymmetric, possible cystitis.   MRCP is motion degraded.  Confirms simple cholelithiasis.  CBD 14 mm with layering stones in distal CBD.  Changes at the pancreatic head and uncinate suggesting mild acute pancreatitis, improved from earlier CT.  MELD score 8. T. bili 0.7.  Alk phos 96.  AST/ALT 28/16.  These are all improved from several days ago.  No alcohol since she was admitted in August.  Prior to that consumed about a pint of whiskey daily.  Lives at home with her husband.  4 living adult children, 1 child killed when he was hit by a car after getting off the schoolbus.  Remote history of smoking.  Previously worked  in a [employer]. Brother with history of alcoholic liver disease, not clear that it was cirrhosis and he quit drinking when he was advised to do so.  Another brother had some sort of cancer but not sure what kind.    Past Medical History:  Diagnosis Date   Alcohol abuse    Dysphagia    Eczema    Essential hypertension    GERD (gastroesophageal reflux disease)    Gout    Hyperlipidemia    Liver cirrhosis (HCC)    Menopause    Metabolic encephalopathy    Prediabetes    Statin intolerance     Past Surgical History:  Procedure Laterality Date   ABDOMINAL HYSTERECTOMY     ABDOMINAL SURGERY      Prior to Admission medications   Medication Sig Start Date End Date Taking? Authorizing Provider  Ascorbic Acid (VITAMIN C WITH ROSE HIPS) 500 MG tablet Take 500 mg by mouth daily. 02/13/22  Yes [provider]  Chelated Zinc 50 MG TABS Take 50 mg by mouth daily. 02/12/22  Yes [provider]  cyanocobalamin (VITAMIN B12) 500 MCG tablet Take 500 mcg by mouth every other day. 01/19/22 01/19/23 Yes [provider]  folic acid (FOLVITE) 093 MCG  tablet Take 400 mcg by mouth daily. 02/13/22  Yes [provider]  magnesium oxide (MAG-OX) 400 MG tablet Take 400 mg by mouth daily. 02/12/22  Yes [provider]  potassium chloride SA (KLOR-CON M) 20 MEQ tablet Take 1 tablet (20 mEq total) by mouth daily. 01/30/22  Yes Tat, Shanon Brow, MD  thiamine (VITAMIN B1) 100 MG tablet Take 100 mg by mouth daily. 02/13/22  Yes [provider]    Scheduled Meds:  enoxaparin (LOVENOX) injection  40 mg Subcutaneous Q24H   melatonin  6 mg Oral QHS   Infusions:  lactated ringers 30 mL/hr at 03/09/22 2355   PRN Meds: acetaminophen **OR** acetaminophen, morphine injection, polyethylene glycol, promethazine   Allergies as of 03/06/2022 - Review Complete 03/06/2022  Allergen Reaction Noted   Amoxicillin Swelling 08/23/2012   Allopurinol Other (See Comments) 06/09/2021    Macrolides and ketolides     Mycinette [phenol]  08/23/2012   Nitrofurantoin Other (See Comments) 06/09/2021   Simvastatin Other (See Comments) 06/09/2021   Rosuvastatin Rash 08/23/2012   Triamcinolone Rash and Other (See Comments) 05/06/2020   Triamcinolone acetonide Rash 05/06/2020    Family History  Problem Relation Age of Onset   Hypertension Mother    Heart disease Mother    Stroke Father    Heart disease Father    Cancer Brother    COPD Brother    Prostate cancer Brother     Social History   Socioeconomic History   Marital status: Married    Spouse name: Not on file   Number of children: Not on file   Years of education: Not on file   Highest education level: Not on file  Occupational History   Not on file  Tobacco Use   Smoking status: Former    Types: Cigarettes    Quit date: 05/24/1992    Years since quitting: 29.8   Smokeless tobacco: Not on file  Substance and Sexual Activity   Alcohol use: Yes    Comment: Occasional   Drug use: No   Sexual activity: Not on file  Other Topics Concern   Not on file  Social History Narrative   Not on file   Social Determinants of Health   Financial Resource Strain: Not on file  Food Insecurity: No Food Insecurity (03/07/2022)   Hunger Vital Sign    Worried About Running Out of Food in the Last Year: Never true    Ran Out of Food in the Last Year: Never true  Transportation Needs: No Transportation Needs (03/07/2022)   PRAPARE - Hydrologist (Medical): No    Lack of Transportation (Non-Medical): No  Physical Activity: Not on file  Stress: Not on file  Social Connections: Not on file  Intimate Partner Violence: Not At Risk (03/07/2022)   Humiliation, Afraid, Rape, and Kick questionnaire    Fear of Current or Ex-Partner: No    Emotionally Abused: No    Physically Abused: No    Sexually Abused: No    REVIEW OF SYSTEMS: Constitutional: Generally lacks energy but no profound  fatigue. ENT:  No nose bleeds Pulm: No shortness of breath.  No cough. CV:  No palpitations, no LE edema.  No angina. GU:  No hematuria, no frequency.  No dark-colored urine. GI: See HPI. Heme: Bruises easily but no large hematomas.  No excessive bleeding. Transfusions: None. Neuro: Impaired memory, confirmed by husband.  No profound confusion in the last few weeks since returning home.  No headaches, no peripheral tingling or numbness.   Derm:  No itching, no rash or sores.  Endocrine:  No sweats or chills.  No polyuria or dysuria Immunization: Reviewed. Travel: Not queried.   PHYSICAL EXAM: Vital signs in last 24 hours: Vitals:   03/10/22 0342 03/10/22 0739  BP: 120/73 (!) 101/58  Pulse: 66 69  Resp: 16 18  Temp: 98 F (36.7 C) 98.5 F (36.9 C)  SpO2: 99% 100%   Wt Readings from Last 3 Encounters:  03/06/22 51.5 kg  02/17/22 51.3 kg  02/08/22 56.7 kg    General: Somewhat frail and chronically ill-appearing.  In bed comfortable and alert.  Answers some questions but husband answers the bulk of the questions. Head: Signs of head trauma.  No facial asymmetry. Eyes: No conjunctival pallor or scleral icterus. Ears: Slightly hard of hearing Nose: No congestion or discharge Mouth: Fair dentition.  Mucosa is moist, pink, clear.  Tongue midline. Neck: No JVD. Lungs: Clear bilaterally.  No labored breathing or cough. Heart: RRR.  No MRG.  S1, S2 present. Abdomen: Soft.  Nontender.  No HSM, masses, bruits, hernias.  Bowel sounds active..   Rectal: Deferred Musc/Skeltl: No gross joint abnormalities though some arthritic changes in the hands. Extremities: No CCE.  Several areas of purpura on the upper and lower legs as well as the arms.  None of these are large. Neurologic: Confused.  Knows who she is but unclear on date, location.  Cannot remember when she last ate.  Could not remember when she started having abdominal pain Skin: Tanned.  No suspicious lesions or sores. Nodes:  No cervical adenopathy Psych: Operative, calm, pleasant.  Intake/Output from previous day: 10/17 0701 - 10/18 0700 In: 1677 [I.V.:1392; IV Piggyback:285] Out: -  Intake/Output this shift: No intake/output data recorded.  LAB RESULTS: Recent Labs    03/08/22 0440 03/09/22 0350  WBC 4.9 4.0  HGB 7.2* 7.5*  HCT 22.8* 23.6*  PLT 164 172   BMET Lab Results  Component Value Date   NA 138 03/10/2022   NA 137 03/09/2022   NA 136 03/08/2022   K 3.3 (L) 03/10/2022   K 3.1 (L) 03/09/2022   K 3.3 (L) 03/08/2022   CL 107 03/10/2022   CL 108 03/09/2022   CL 109 03/08/2022   CO2 24 03/10/2022   CO2 24 03/09/2022   CO2 22 03/08/2022   GLUCOSE 86 03/10/2022   GLUCOSE 75 03/09/2022   GLUCOSE 72 03/08/2022   BUN <5 (L) 03/10/2022   BUN 6 (L) 03/09/2022   BUN 8 03/08/2022   CREATININE 0.61 03/10/2022   CREATININE 0.50 03/09/2022   CREATININE 0.60 03/08/2022   CALCIUM 8.1 (L) 03/10/2022   CALCIUM 8.0 (L) 03/09/2022   CALCIUM 7.9 (L) 03/08/2022   LFT Recent Labs    03/08/22 0440 03/09/22 0350 03/10/22 0433  PROT 4.0* 4.2* 3.9*  ALBUMIN 1.8* 1.8* 1.7*  AST 34 30 28  ALT _0 ALKPHOS 107 106 96  BILITOT 1.2 0.9 0.7   PT/INR Lab Results  Component Value Date   INR 1.2 03/08/2022   INR 1.0 03/06/2022   INR 1.2 01/22/2022   Hepatitis Panel No results for input(s): "HEPBSAG", "HCVAB", "HEPAIGM", "HEPBIGM" in the last 72 hours. C-Diff No components found for: "CDIFF" Lipase     Component Value Date/Time   LIPASE 54 (H) 03/08/2022 0440    Drugs of Abuse  No results found for: "LABOPIA", "COCAINSCRNUR", "LABBENZ", "AMPHETMU", "THCU", "LABBARB"  RADIOLOGY STUDIES: MR ABDOMEN MRCP W WO CONTAST  Result Date: 03/08/2022 CLINICAL DATA:  Cholelithiasis.  Pancreatitis suspected. EXAM: MRI ABDOMEN WITHOUT AND WITH CONTRAST (INCLUDING MRCP) TECHNIQUE: Multiplanar multisequence MR imaging of the abdomen was performed both before and after the administration of  intravenous contrast. Heavily T2-weighted images of the biliary and pancreatic ducts were obtained, and three-dimensional MRCP images were rendered by post processing. CONTRAST:  72m GADAVIST GADOBUTROL 1 MMOL/ML IV SOLN COMPARISON:  CTA abdomen/pelvis dated 03/06/2022. FINDINGS: Motion degraded images. Lower chest: Trace bilateral pleural effusions. Hepatobiliary: Liver is grossly unremarkable. No hepatic steatosis. No suspicious/enhancing hepatic lesions. Layering gallstones (series 9/image 15), without gallbladder wall thickening or pericholecystic inflammatory changes to suggest acute cholecystitis. Mild central intrahepatic ductal dilatation (series 4/image 11). Dilated common duct, measuring 14 mm (series 3/image 13). Suspected tiny layering gallstones in the distal CBD (series 13/image 13). Pancreas: Trace peripancreatic fluid along the posterior pancreatic head/uncinate process (series 4/image 19), although improved from prior CT. No drainable fluid collection/pseudocyst. Spleen:  Within normal limits. Adrenals/Urinary Tract:  Adrenal glands are within normal limits. Kidneys are within normal limits.  No hydronephrosis. Stomach/Bowel: Stomach is within normal limits. Visualized bowel is notable for right colonic diverticulosis, without associated inflammatory changes. Vascular/Lymphatic:  No evidence of abdominal aortic aneurysm. No suspicious abdominal lymphadenopathy. Other:  No abdominal ascites. Musculoskeletal: Mild degenerative changes of the lumbar spine with dextroscoliosis. IMPRESSION: Motion degraded images. Cholelithiasis, without evidence of acute cholecystitis. Mild central intrahepatic and extrahepatic ductal dilatation. Dilated common duct, measuring 14 mm. Suspected choledocholithiasis with tiny layering gallstones in the distal CBD. Consider ERCP as clinically warranted. Trace peripancreatic fluid along the posterior pancreatic head/uncinate process, suggesting mild acute pancreatitis,  although improved from prior CT. No associated complication. Electronically Signed   By: SJulian HyM.D.   On: 03/08/2022 19:58   MR 3D Recon At Scanner  Result Date: 03/08/2022 CLINICAL DATA:  Cholelithiasis.  Pancreatitis suspected. EXAM: MRI ABDOMEN WITHOUT AND WITH CONTRAST (INCLUDING MRCP) TECHNIQUE: Multiplanar multisequence MR imaging of the abdomen was performed both before and after the administration of intravenous contrast. Heavily T2-weighted images of the biliary and pancreatic ducts were obtained, and three-dimensional MRCP images were rendered by post processing. CONTRAST:  525mGADAVIST GADOBUTROL 1 MMOL/ML IV SOLN COMPARISON:  CTA abdomen/pelvis dated 03/06/2022. FINDINGS: Motion degraded images. Lower chest: Trace bilateral pleural effusions. Hepatobiliary: Liver is grossly unremarkable. No hepatic steatosis. No suspicious/enhancing hepatic lesions. Layering gallstones (series 9/image 15), without gallbladder wall thickening or pericholecystic inflammatory changes to suggest acute cholecystitis. Mild central intrahepatic ductal dilatation (series 4/image 11). Dilated common duct, measuring 14 mm (series 3/image 13). Suspected tiny layering gallstones in the distal CBD (series 13/image 13). Pancreas: Trace peripancreatic fluid along the posterior pancreatic head/uncinate process (series 4/image 19), although improved from prior CT. No drainable fluid collection/pseudocyst. Spleen:  Within normal limits. Adrenals/Urinary Tract:  Adrenal glands are within normal limits. Kidneys are within normal limits.  No hydronephrosis. Stomach/Bowel: Stomach is within normal limits. Visualized bowel is notable for right colonic diverticulosis, without associated inflammatory changes. Vascular/Lymphatic:  No evidence of abdominal aortic aneurysm. No suspicious abdominal lymphadenopathy. Other:  No abdominal ascites. Musculoskeletal: Mild degenerative changes of the lumbar spine with dextroscoliosis.  IMPRESSION: Motion degraded images. Cholelithiasis, without evidence of acute cholecystitis. Mild central intrahepatic and extrahepatic ductal dilatation. Dilated common duct, measuring 14 mm. Suspected choledocholithiasis with tiny layering gallstones in the distal CBD. Consider ERCP as clinically warranted. Trace peripancreatic fluid along the posterior pancreatic head/uncinate process, suggesting mild  acute pancreatitis, although improved from prior CT. No associated complication. Electronically Signed   By: Julian Hy M.D.   On: 03/08/2022 19:58      IMPRESSION:   Acute, mild pancreatitis.  Lipase improved.  Clinically with resolved abdominal pain.  Looks to be biliary pancreatitis.    History of cirrhosis due to alcohol, however, her liver is normal on MRI and CT  Memory impairment.  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  Memory impairment persists.  Hypokalemia.  K is 3.3  Hypoalbuminemia.  Suspect some level of protein calorie malnutrition.  Normocytic anemia.  Hb 7.5 which is in range from 6 weeks ago.    PLAN:      ERCP, slot at 1330 tomorrow.  NPO after 0500 tmrw.    Allow soft diet.  Patient says she is not interested in having clear liquids or full liquids, they have no appeal for her.  So will give her a trial of soft diet.  Ordered pre ERCP Cipro.  Antibiotic ordering is complicated by the fact that she had severe reaction to amoxicillin, alternative is ciprofloxacin and she has a history of prolonged QT syndrome.  However Cipro seems like the least problematic as she will only receive 1 dose prior to the ERCP (Endoscopicretrogradecholangiopancreatography).   Azucena Freed  03/10/2022, 8:33 AM Phone [phone]    Attending Physician Note   I have taken a history, reviewed the chart and examined the patient. I performed a substantive portion of this encounter, including complete performance of at least one of the key  components, in conjunction with the APP. I agree with the APP's note, impression and recommendations with my edits. My additional impressions and recommendations are as follows.   Acute, mild biliary pancreatitis which is resolving. MRI/MRCP shows biliary tree dilation, cholelithiasis, suspected CBD stones. ERCP tomorrow. Surgery following for consideration of cholecystectomy.  History of alcohol abuse. History of Wernicke's encephalopathy. Question history of cirrhosis as her liver appears normal on MRI and CT. Platelets are normal.   Lucio Edward, MD [facility] See AMION, [facility], for our on call provider

## 2022-03-10 NOTE — Consult Note (Signed)
[facility] Surgery Consult Note  Maria Dunlap 1950-07-28  [id number].    Requesting MD: Jennye Moccasin PA Chief Complaint/Reason for Consult: gallstone pancreatitis  HPI:  Maria Dunlap is a 71 y.o. 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 for management of choledocholithiasis and gallstone pancreatitis. Daughter at bedside who helped with history taking. States that she went to the ED 10/15 complaining of several days of RUQ abdominal pain, nausea, and vomiting. CTA suggested pancreatitis, secondary duodenitis, cholelithiasis without acute cholecystitis, and mildly dilated common bile duct at 61mm. LFTs were elevated with Tbili 2, and have now normalized. Initial lipase 1643, trended down to 54 two days ago. MRCP with suspected choledocholithiasis with tiny layering gallstones in the distal CBD, cholelithiasis without evidence of acute cholecystitis, and mild acute pancreatitis.  GI has seen with plans for ERCP Friday 10/20.  General surgery asked to see for cholecystectomy.  Abdominal surgical history: hysterectomy Anticoagulants: none Former smoker, quit several years ago H/o heavy alcohol use, last drink was 01/09/22 Lives at home with husband, ambulates with walker PRN   Family History  Problem Relation Age of Onset   Hypertension Mother    Heart disease Mother    Stroke Father    Heart disease Father    Cancer Brother    COPD Brother    Prostate cancer Brother     Past Medical History:  Diagnosis Date   Alcohol abuse    Dysphagia    Eczema    Essential hypertension    GERD (gastroesophageal reflux disease)    Gout    Hyperlipidemia    Liver cirrhosis (HCC)    Menopause    Metabolic encephalopathy    Prediabetes    Statin intolerance     Past Surgical History:  Procedure Laterality Date   ABDOMINAL HYSTERECTOMY     ABDOMINAL SURGERY      Social History:  reports that she quit smoking  about 29 years ago. Her smoking use included cigarettes. She does not have any smokeless tobacco history on file. She reports current alcohol use. She reports that she does not use drugs.  Allergies:  Allergies  Allergen Reactions   Amoxicillin Swelling    Neck swell Throat swelling   Allopurinol Other (See Comments)    Elevated LFT's Elevated LFT's   Macrolides And Ketolides    Mycinette [Phenol]    Nitrofurantoin Other (See Comments)   Simvastatin Other (See Comments)    Elevated LFT's  Elevated LFT's   Rosuvastatin Rash    dizziness   Triamcinolone Rash and Other (See Comments)   Triamcinolone Acetonide Rash    Medications Prior to Admission  Medication Sig Dispense Refill   Ascorbic Acid (VITAMIN C WITH ROSE HIPS) 500 MG tablet Take 500 mg by mouth daily.     Chelated Zinc 50 MG TABS Take 50 mg by mouth daily.     cyanocobalamin (VITAMIN B12) 500 MCG tablet Take 500 mcg by mouth every other day.     folic acid (FOLVITE) 400 MCG tablet Take 400 mcg by mouth daily.     magnesium oxide (MAG-OX) 400 MG tablet Take 400 mg by mouth daily.     potassium chloride SA (KLOR-CON M) 20 MEQ tablet Take 1 tablet (20 mEq total) by mouth daily.     thiamine (VITAMIN B1) 100 MG tablet Take 100 mg by mouth daily.      Prior to Admission medications  Medication Sig Start Date End Date Taking? Authorizing Provider  Ascorbic Acid (VITAMIN C WITH ROSE HIPS) 500 MG tablet Take 500 mg by mouth daily. 02/13/22  Yes [provider]  Chelated Zinc 50 MG TABS Take 50 mg by mouth daily. 02/12/22  Yes [provider]  cyanocobalamin (VITAMIN B12) 500 MCG tablet Take 500 mcg by mouth every other day. 01/19/22 01/19/23 Yes [provider]  folic acid (FOLVITE) 400 MCG tablet Take 400 mcg by mouth daily. 02/13/22  Yes [provider]  magnesium oxide (MAG-OX) 400 MG tablet Take 400 mg by mouth daily. 02/12/22  Yes [provider]  potassium chloride SA (KLOR-CON  M) 20 MEQ tablet Take 1 tablet (20 mEq total) by mouth daily. 01/30/22  Yes Tat, Onalee Hua, MD  thiamine (VITAMIN B1) 100 MG tablet Take 100 mg by mouth daily. 02/13/22  Yes [provider]    Blood pressure 124/77, pulse 83, temperature 98 F (36.7 C), temperature source Oral, resp. rate 18, height 5\' 1"  (1.549 m), weight 51.5 kg, SpO2 99 %. Physical Exam: General: pleasant, elderly female who is laying in bed in NAD HEENT: head is normocephalic, atraumatic.  Sclera are noninjected.  Pupils equal and round.  Ears and nose without any masses or lesions.  Mouth is pink and moist. Dentition fair Heart: regular, rate, and rhythm Lungs: respiratory effort nonlabored on room air Abd: soft, NT/ND, +BS, no masses, hernias, or organomegaly MS: no BUE/BLE edema, calves soft and nontender Skin: warm and dry with no masses, lesions, or rashes Psych: Alert, oriented to self, unable to give full history, confused Neuro: MAEs, no gross motor or sensory deficits BUE/BLE  Results for orders placed or performed during the hospital encounter of 03/06/22 (from the past 48 hour(s))  Vitamin B12     Status: Abnormal   Collection Time: 03/08/22  2:44 PM  Result Value Ref Range   Vitamin B-12 1,222 (H) 180 - 914 pg/mL    Comment: (NOTE) This assay is not validated for testing neonatal or myeloproliferative syndrome specimens for Vitamin B12 levels. Performed at [facility], [address] [place], [place]   Folate     Status: None   Collection Time: 03/08/22  2:44 PM  Result Value Ref Range   Folate 11.2 >5.9 ng/mL    Comment: Performed at [facility], [address] [place], [place]  Iron and TIBC     Status: Abnormal   Collection Time: 03/08/22  2:44 PM  Result Value Ref Range   Iron 55 28 - 170 ug/dL   TIBC 03/10/22 (L) 562 - 130 ug/dL   Saturation Ratios 51 (H) 10.4 - 31.8 %   UIBC 53 ug/dL    Comment: Performed at [facility], [address] [place], [place]   Ferritin     Status: Abnormal   Collection Time: 03/08/22  2:44 PM  Result Value Ref Range   Ferritin 676 (H) 11 - 307 ng/mL    Comment: Performed at [facility], [address], [place], [place]  Reticulocytes     Status: Abnormal   Collection Time: 03/08/22  2:44 PM  Result Value Ref Range   Retic Ct Pct 3.2 (H) 0.4 - 3.1 %   RBC. 2.67 (L) 3.87 - 5.11 MIL/uL   Retic Count, Absolute 86.5 19.0 - 186.0 K/uL   Immature Retic Fract 17.3 (H) 2.3 - 15.9 %    Comment: Performed at [facility], [address] [place], [place] [address]  Comprehensive metabolic panel     Status: Abnormal   Collection Time: 03/09/22  3:50 AM  Result Value Ref Range   Sodium 137 135 - 145 mmol/L   Potassium 3.1 (L) 3.5 - 5.1 mmol/L   Chloride 108 98 - 111 mmol/L   CO2 24 22 - 32 mmol/L   Glucose, Bld 75 70 - 99 mg/dL    Comment: Glucose reference range applies only to samples taken after fasting for at least 8 hours.   BUN 6 (L) 8 - 23 mg/dL   Creatinine, Ser 0.50 0.44 - 1.00 mg/dL   Calcium 8.0 (L) 8.9 - 10.3 mg/dL   Total Protein 4.2 (L) 6.5 - 8.1 g/dL   Albumin 1.8 (L) 3.5 - 5.0 g/dL   AST 30 15 - 41 U/L   ALT 18 0 - 44 U/L   Alkaline Phosphatase 106 38 - 126 U/L   Total Bilirubin 0.9 0.3 - 1.2 mg/dL   GFR, Estimated >60 >60 mL/min    Comment: (NOTE) Calculated using the CKD-EPI Creatinine Equation (2021)    Anion gap 5 5 - 15    Comment: Performed at [facility], [address], [place], [place] [address]  CBC     Status: Abnormal   Collection Time: 03/09/22  3:50 AM  Result Value Ref Range   WBC 4.0 4.0 - 10.5 K/uL   RBC 2.42 (L) 3.87 - 5.11 MIL/uL   Hemoglobin 7.5 (L) 12.0 - 15.0 g/dL   HCT 23.6 (L) 36.0 - 46.0 %   MCV 97.5 80.0 - 100.0 fL   MCH 31.0 26.0 - 34.0 pg   MCHC 31.8 30.0 - 36.0 g/dL   RDW 14.5 11.5 - 15.5 %   Platelets 172 150 - 400 K/uL   nRBC 0.0 0.0 - 0.2 %    Comment: Performed at [facility], [address] [place] [address] [place], [place] [address]  Comprehensive  metabolic panel     Status: Abnormal   Collection Time: 03/10/22  4:33 AM  Result Value Ref Range   Sodium 138 135 - 145 mmol/L   Potassium 3.3 (L) 3.5 - 5.1 mmol/L   Chloride 107 98 - 111 mmol/L   CO2 24 22 - 32 mmol/L   Glucose, Bld 86 70 - 99 mg/dL    Comment: Glucose reference range applies only to samples taken after fasting for at least 8 hours.   BUN <5 (L) 8 - 23 mg/dL   Creatinine, Ser 0.61 0.44 - 1.00 mg/dL   Calcium 8.1 (L) 8.9 - 10.3 mg/dL   Total Protein 3.9 (L) 6.5 - 8.1 g/dL   Albumin 1.7 (L) 3.5 - 5.0 g/dL   AST 28 15 - 41 U/L   ALT 16 0 - 44 U/L   Alkaline Phosphatase 96 38 - 126 U/L   Total Bilirubin 0.7 0.3 - 1.2 mg/dL   GFR, Estimated >60 >60 mL/min    Comment: (NOTE) Calculated using the CKD-EPI Creatinine Equation (2021)    Anion gap 7 5 - 15    Comment: Performed at [facility], [place] [address], [place],  [address]   MR ABDOMEN MRCP W WO CONTAST  Result Date: 03/08/2022 CLINICAL DATA:  Cholelithiasis.  Pancreatitis suspected. EXAM: MRI ABDOMEN WITHOUT AND WITH CONTRAST (INCLUDING MRCP) TECHNIQUE: Multiplanar multisequence MR imaging of the abdomen was performed both before and after the administration of intravenous contrast. Heavily T2-weighted images of the biliary and pancreatic ducts were obtained, and three-dimensional  MRCP images were rendered by post processing. CONTRAST:  48mL GADAVIST GADOBUTROL 1 MMOL/ML IV SOLN COMPARISON:  CTA abdomen/pelvis dated 03/06/2022. FINDINGS: Motion degraded images. Lower chest: Trace bilateral pleural effusions. Hepatobiliary: Liver is grossly unremarkable. No hepatic steatosis. No suspicious/enhancing hepatic lesions. Layering gallstones (series 9/image 15), without gallbladder wall thickening or pericholecystic inflammatory changes to suggest acute cholecystitis. Mild central intrahepatic ductal dilatation (series 4/image 11). Dilated common duct, measuring 14 mm (series 3/image 13). Suspected tiny layering  gallstones in the distal CBD (series 13/image 13). Pancreas: Trace peripancreatic fluid along the posterior pancreatic head/uncinate process (series 4/image 19), although improved from prior CT. No drainable fluid collection/pseudocyst. Spleen:  Within normal limits. Adrenals/Urinary Tract:  Adrenal glands are within normal limits. Kidneys are within normal limits.  No hydronephrosis. Stomach/Bowel: Stomach is within normal limits. Visualized bowel is notable for right colonic diverticulosis, without associated inflammatory changes. Vascular/Lymphatic:  No evidence of abdominal aortic aneurysm. No suspicious abdominal lymphadenopathy. Other:  No abdominal ascites. Musculoskeletal: Mild degenerative changes of the lumbar spine with dextroscoliosis. IMPRESSION: Motion degraded images. Cholelithiasis, without evidence of acute cholecystitis. Mild central intrahepatic and extrahepatic ductal dilatation. Dilated common duct, measuring 14 mm. Suspected choledocholithiasis with tiny layering gallstones in the distal CBD. Consider ERCP as clinically warranted. Trace peripancreatic fluid along the posterior pancreatic head/uncinate process, suggesting mild acute pancreatitis, although improved from prior CT. No associated complication. Electronically Signed   By: Charline Bills M.D.   On: 03/08/2022 19:58   MR 3D Recon At Scanner  Result Date: 03/08/2022 CLINICAL DATA:  Cholelithiasis.  Pancreatitis suspected. EXAM: MRI ABDOMEN WITHOUT AND WITH CONTRAST (INCLUDING MRCP) TECHNIQUE: Multiplanar multisequence MR imaging of the abdomen was performed both before and after the administration of intravenous contrast. Heavily T2-weighted images of the biliary and pancreatic ducts were obtained, and three-dimensional MRCP images were rendered by post processing. CONTRAST:  69mL GADAVIST GADOBUTROL 1 MMOL/ML IV SOLN COMPARISON:  CTA abdomen/pelvis dated 03/06/2022. FINDINGS: Motion degraded images. Lower chest: Trace bilateral  pleural effusions. Hepatobiliary: Liver is grossly unremarkable. No hepatic steatosis. No suspicious/enhancing hepatic lesions. Layering gallstones (series 9/image 15), without gallbladder wall thickening or pericholecystic inflammatory changes to suggest acute cholecystitis. Mild central intrahepatic ductal dilatation (series 4/image 11). Dilated common duct, measuring 14 mm (series 3/image 13). Suspected tiny layering gallstones in the distal CBD (series 13/image 13). Pancreas: Trace peripancreatic fluid along the posterior pancreatic head/uncinate process (series 4/image 19), although improved from prior CT. No drainable fluid collection/pseudocyst. Spleen:  Within normal limits. Adrenals/Urinary Tract:  Adrenal glands are within normal limits. Kidneys are within normal limits.  No hydronephrosis. Stomach/Bowel: Stomach is within normal limits. Visualized bowel is notable for right colonic diverticulosis, without associated inflammatory changes. Vascular/Lymphatic:  No evidence of abdominal aortic aneurysm. No suspicious abdominal lymphadenopathy. Other:  No abdominal ascites. Musculoskeletal: Mild degenerative changes of the lumbar spine with dextroscoliosis. IMPRESSION: Motion degraded images. Cholelithiasis, without evidence of acute cholecystitis. Mild central intrahepatic and extrahepatic ductal dilatation. Dilated common duct, measuring 14 mm. Suspected choledocholithiasis with tiny layering gallstones in the distal CBD. Consider ERCP as clinically warranted. Trace peripancreatic fluid along the posterior pancreatic head/uncinate process, suggesting mild acute pancreatitis, although improved from prior CT. No associated complication. Electronically Signed   By: Charline Bills M.D.   On: 03/08/2022 19:58    Anti-infectives (From admission, onward)    Start     Dose/Rate Route Frequency Ordered Stop   03/07/22 0400  cefTRIAXone (ROCEPHIN) 1 g in sodium chloride 0.9 % 100 mL IVPB  1 g 200 mL/hr  over 30 Minutes Intravenous Every 24 hours 03/06/22 2104 03/09/22 0346   03/06/22 2200  ceFEPIme (MAXIPIME) 2 g in sodium chloride 0.9 % 100 mL IVPB  Status:  Discontinued        2 g 200 mL/hr over 30 Minutes Intravenous Every 8 hours 03/06/22 1817 03/06/22 1822   03/06/22 2200  ceFEPIme (MAXIPIME) 2 g in sodium chloride 0.9 % 100 mL IVPB  Status:  Discontinued        2 g 200 mL/hr over 30 Minutes Intravenous Every 12 hours 03/06/22 1822 03/06/22 2104   03/06/22 1645  ceFEPIme (MAXIPIME) 2 g in sodium chloride 0.9 % 100 mL IVPB  Status:  Discontinued       See Hyperspace for full Linked Orders Report.   2 g 200 mL/hr over 30 Minutes Intravenous  Once 03/06/22 1640 03/06/22 1642   03/06/22 1645  metroNIDAZOLE (FLAGYL) IVPB 500 mg  Status:  Discontinued       See Hyperspace for full Linked Orders Report.   500 mg 100 mL/hr over 60 Minutes Intravenous  Once 03/06/22 1640 03/06/22 1642   03/06/22 1645  ceFEPIme (MAXIPIME) 2 g in sodium chloride 0.9 % 100 mL IVPB        2 g 200 mL/hr over 30 Minutes Intravenous  Once 03/06/22 1642 03/06/22 1741   03/06/22 1645  metroNIDAZOLE (FLAGYL) IVPB 500 mg        500 mg 100 mL/hr over 60 Minutes Intravenous  Once 03/06/22 1642 03/06/22 1933        Assessment/Plan Gallstone pancreatitis Choledocholithiasis - Patient with gallstone pancreatitis and choledocholithiasis. Pancreatitis seems improved with lipase down to 54 a couple days ago and she is nontender on abdominal exam. She is scheduled for ERCP on 03/12/22. We will follow and plan for laparoscopic cholecystectomy following ERCP. This was discussed with the patient and her daughter at bedside.   ID - none currently VTE - SCDs, lovenox FEN - IVF, soft diet Foley - none  Cirrhosis from prior alcohol use - meld score ~8 Memory impairment, prior diagnosis of Warnicke encephalopathy   I reviewed Consultant gastroenterology notes, hospitalist notes, last 24 h vitals and pain scores, last 48 h  intake and output, last 24 h labs and trends, and last 24 h imaging results.   Franne FortsBrooke A Cally Nygard, PA-C [facility] 03/10/2022, 1:00 PM Please see Amion for pager number during day hours 7:00am-4:30pm

## 2022-03-11 DIAGNOSIS — R933 Abnormal findings on diagnostic imaging of other parts of digestive tract: Secondary | ICD-10-CM

## 2022-03-11 DIAGNOSIS — K8050 Calculus of bile duct without cholangitis or cholecystitis without obstruction: Secondary | ICD-10-CM

## 2022-03-11 DIAGNOSIS — K8051 Calculus of bile duct without cholangitis or cholecystitis with obstruction: Secondary | ICD-10-CM

## 2022-03-11 DIAGNOSIS — K7030 Alcoholic cirrhosis of liver without ascites: Secondary | ICD-10-CM | POA: Diagnosis not present

## 2022-03-11 DIAGNOSIS — E876 Hypokalemia: Secondary | ICD-10-CM | POA: Diagnosis not present

## 2022-03-11 DIAGNOSIS — K851 Biliary acute pancreatitis without necrosis or infection: Secondary | ICD-10-CM | POA: Diagnosis not present

## 2022-03-11 LAB — COMPREHENSIVE METABOLIC PANEL
ALT: 16 U/L (ref 0–44)
AST: 29 U/L (ref 15–41)
Albumin: 1.8 g/dL — ABNORMAL LOW (ref 3.5–5.0)
Alkaline Phosphatase: 90 U/L (ref 38–126)
Anion gap: 6 (ref 5–15)
BUN: <5 mg/dL — ABNORMAL LOW (ref 8–23)
CO2: 22 mmol/L (ref 22–32)
Calcium: 8.3 mg/dL — ABNORMAL LOW (ref 8.9–10.3)
Chloride: 111 mmol/L (ref 98–111)
Creatinine, Ser: 0.68 mg/dL (ref 0.44–1.00)
GFR, Estimated: >60 mL/min (ref >60)
Glucose, Bld: 79 mg/dL (ref 70–99)
Potassium: 4 mmol/L (ref 3.5–5.1)
Sodium: 139 mmol/L (ref 135–145)
Total Bilirubin: 1 mg/dL (ref 0.3–1.2)
Total Protein: 4.1 g/dL — ABNORMAL LOW (ref 6.5–8.1)

## 2022-03-11 LAB — CBC
HCT: 23.7 % — ABNORMAL LOW (ref 36.0–46.0)
Hemoglobin: 7.6 g/dL — ABNORMAL LOW (ref 12.0–15.0)
MCH: 30.5 pg (ref 26.0–34.0)
MCHC: 32.1 g/dL (ref 30.0–36.0)
MCV: 95.2 fL (ref 80.0–100.0)
Platelets: 198 10*3/uL (ref 150–400)
RBC: 2.49 MIL/uL — ABNORMAL LOW (ref 3.87–5.11)
RDW: 14.4 % (ref 11.5–15.5)
WBC: 4.4 10*3/uL (ref 4.0–10.5)
nRBC: 0 % (ref 0.0–0.2)

## 2022-03-11 LAB — CULTURE, BLOOD (SINGLE): Culture: NO GROWTH

## 2022-03-11 LAB — LIPASE, BLOOD: Lipase: 43 U/L (ref 11–51)

## 2022-03-11 MED ORDER — CHLORHEXIDINE GLUCONATE CLOTH 2 % EX PADS
6.0000 | MEDICATED_PAD | Freq: Every day | CUTANEOUS | Status: AC
  Administered 2022-03-11 – 2022-03-14 (×3): 6 via TOPICAL

## 2022-03-11 MED ORDER — MUPIROCIN 2 % EX OINT
1.0000 | TOPICAL_OINTMENT | Freq: Two times a day (BID) | CUTANEOUS | Status: AC
  Administered 2022-03-11 – 2022-03-15 (×9): 1 via NASAL
  Filled 2022-03-11 (×3): qty 22

## 2022-03-11 NOTE — Progress Notes (Signed)
[facility] PROGRESS NOTE    Progress Note  Maria Dunlap  WNI:[id number] DOB: [birth year] DOA: 03/06/2022 PCP: Sonny Masters, FNP     Brief Narrative:   Maria Dunlap is an 71 y.o. female past medical history significant for alcohol abuse, comes into the clinic with abdominal pain and vomiting along with multiple experience.  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  Gallstone pancreatitis GI was consulted who recommended transfer to [facility] possible ERCP, MRCP done that showed common bile duct with a 14 mm stone.    Assessment/Plan:   Acute gallstone pancreatitis CT findings suggestive of acute pancreatitis MRCP confirmed CBD stone of 14 mm. General surgery GI was consulted recommended transfer to [facility]. GI recommended ERCP scheduled for 03/12/2022. Continue current diet.  Elevated lactic acid: Borderline elevated pancreatitis she is not septic.  Lower UTI: She reported dysuria, she reported he was on a course of antibiotics about 2-1/2 weeks ago. Was started on Rocephin for 3 days. Urine cultures were sent which are negative.  Prolonged QTc: Try the potassium greater than 4 magnesium greater than 2.  Normocytic anemia: Hemoglobin has remained relatively stable at 7.5.  Hypokalemia: Potassium still low at 3.3, was repleted IV with minimal success replete orally recheck tomorrow morning.    DVT prophylaxis: lovenox Family Communication:daughter Status is: Inpatient Remains inpatient appropriate because: Acute gallstone hepatitis.    Code Status:     Code Status Orders  (From admission, onward)           Start     Ordered   03/06/22 2102  Full code  Continuous        03/06/22 2104           Code Status History      Date Active Date Inactive Code Status Order ID Comments User Context   01/22/2022 2029 01/29/2022 1808 Full Code [id number]  Elgergawy, Leana Roe, MD Inpatient         IV Access:   Peripheral IV   Procedures and diagnostic studies:   No results found.   Medical Consultants:   None.   Subjective:    Maria Dunlap pain is controlled.  Objective:    Vitals:   03/10/22 1600 03/10/22 1946 03/11/22 0247 03/11/22 0915  BP: 122/89 119/77 123/87 122/78  Pulse: 72 85 72 62  Resp: 18  16 15   Temp: 98.1 F (36.7 C)  98.4 F (36.9 C) 98.8 F (37.1 C)  TempSrc: Oral  Oral Oral  SpO2: 98% 99% 100% 100%  Weight:      Height:       SpO2: 100 %   Intake/Output Summary (Last 24 hours) at 03/11/2022 1116 Last data filed at 03/10/2022 2200 Gross per 24 hour  Intake 480 ml  Output --  Net 480 ml    Filed Weights   03/06/22 1821  Weight: 51.5 kg    Exam: General exam: In no acute distress. Respiratory system: Good air movement and clear to auscultation. Cardiovascular system: S1 & S2 heard, RRR. No JVD. Gastrointestinal system: Abdomen is nondistended, soft and nontender.  Extremities: No pedal edema. Skin: No rashes, lesions or ulcers Psychiatry: Judgement and insight appear normal. Mood & affect appropriate. Data Reviewed:    Labs: Basic Metabolic Panel: Recent Labs  Lab 03/06/22 1530 03/07/22  0451 03/08/22 0440 03/09/22 0350 03/10/22 0433 03/11/22 0327  NA 136 136 136 137 138 139  K 3.2* 3.9 3.3* 3.1* 3.3* 4.0  CL 106 109 109 108 107 111  CO2 18* 20* 22 24 24 22   GLUCOSE 150* 76 72 75 86 79  BUN 11 11 8  6* <5* <5*  CREATININE 0.78 0.59 0.60 0.50 0.61 0.68  CALCIUM 9.0 8.4* 7.9* 8.0* 8.1* 8.3*  MG 1.5* 2.2  --   --   --   --     GFR Estimated Creatinine Clearance: 47.3 mL/min (by C-G formula based on SCr of 0.68 mg/dL). Liver Function Tests: Recent Labs  Lab 03/07/22 0451 03/08/22 0440 03/09/22 0350 03/10/22 0433 03/11/22 0327  AST 62*  34 30 28 29   ALT 27 19 18 16 16   ALKPHOS 146* 107 106 96 90  BILITOT 1.5* 1.2 0.9 0.7 1.0  PROT 4.9* 4.0* 4.2* 3.9* 4.1*  ALBUMIN 2.2* 1.8* 1.8* 1.7* 1.8*    Recent Labs  Lab 03/06/22 1530 03/07/22 0451 03/08/22 0440 03/11/22 0327  LIPASE 1,643* 261* 54* 43    Recent Labs  Lab 03/08/22 1149  AMMONIA <10    Coagulation profile Recent Labs  Lab 03/06/22 1553 03/08/22 1149  INR 1.0 1.2    COVID-19 Labs  Recent Labs    03/08/22 1444  FERRITIN 676*     No results found for: "SARSCOV2NAA"  CBC: Recent Labs  Lab 03/06/22 1530 03/07/22 0451 03/08/22 0440 03/09/22 0350 03/11/22 0327  WBC 10.4 7.1 4.9 4.0 4.4  NEUTROABS 8.0*  --   --   --   --   HGB 10.5* 8.8* 7.2* 7.5* 7.6*  HCT 32.3* 28.3* 22.8* 23.6* 23.7*  MCV 95.6 98.3 97.4 97.5 95.2  PLT 263 200 164 172 198    Cardiac Enzymes: No results for input(s): "CKTOTAL", "CKMB", "CKMBINDEX", "TROPONINI" in the last 168 hours. BNP (last 3 results) No results for input(s): "PROBNP" in the last 8760 hours. CBG: Recent Labs  Lab 03/07/22 0045  GLUCAP 91    D-Dimer: No results for input(s): "DDIMER" in the last 72 hours. Hgb A1c: No results for input(s): "HGBA1C" in the last 72 hours. Lipid Profile: No results for input(s): "CHOL", "HDL", "LDLCALC", "TRIG", "CHOLHDL", "LDLDIRECT" in the last 72 hours. Thyroid function studies: No results for input(s): "TSH", "T4TOTAL", "T3FREE", "THYROIDAB" in the last 72 hours.  Invalid input(s): "FREET3" Anemia work up: Recent Labs    03/08/22 1444  VITAMINB12 1,222*  FOLATE 11.2  FERRITIN 676*  TIBC 108*  IRON 55  RETICCTPCT 3.2*    Sepsis Labs: Recent Labs  Lab 03/06/22 1540 03/06/22 1743 03/06/22 2318 03/07/22 0451 03/08/22 0440 03/09/22 0350 03/11/22 0327  WBC  --   --   --  7.1 4.9 4.0 4.4  LATICACIDVEN 3.6* 3.5* 1.9  --   --   --   --     Microbiology Recent Results (from the past 240 hour(s))  Culture, blood (single)     Status: None    Collection Time: 03/06/22  4:49 PM   Specimen: BLOOD  Result Value Ref Range Status   Specimen Description   Final    BLOOD RIGHT ANTECUBITAL BOTTLES DRAWN AEROBIC AND ANAEROBIC   Special Requests   Final    Blood Culture results may not be optimal due to an excessive volume of blood received in culture bottles   Culture   Final    NO GROWTH 5 DAYS Performed at [facility],  [address] [place], [place] [address]    Report Status 03/11/2022 FINAL  Final  Urine Culture     Status: None   Collection Time: 03/06/22  5:52 PM   Specimen: Urine, Catheterized  Result Value Ref Range Status   Specimen Description   Final    URINE, CATHETERIZED Performed at [facility], [address] [place], [place] [address]    Special Requests   Final    NONE Performed at [facility], [address], [place], [place] [address]    Culture   Final    NO GROWTH Performed at [facility], [place] [address] [place], [place] [address]    Report Status 03/08/2022 FINAL  Final  Surgical pcr screen     Status: Abnormal   Collection Time: 03/10/22  4:42 PM   Specimen: Nasal Mucosa; Nasal Swab  Result Value Ref Range Status   MRSA, PCR POSITIVE (A) NEGATIVE Final    Comment: RESULT CALLED TO, READ BACK BY AND VERIFIED WITH: RN SIMON 03/10/22 @2210      Staphylococcus aureus POSITIVE (A) NEGATIVE Final    Comment: (NOTE) The Xpert SA Assay (FDA approved for NASAL specimens in patients 89 years of age and older), is one component of a comprehensive surveillance program. It is not intended to diagnose infection nor to guide or monitor treatment. Performed at [facility], [place] [address] [place], [place] [address]      Medications:    Chlorhexidine Gluconate Cloth  6 each Topical Q0600   enoxaparin (LOVENOX) injection  40 mg Subcutaneous Q24H   melatonin  6 mg Oral QHS   mupirocin ointment  1 Application Nasal BID   Continuous Infusions:  lactated ringers 30 mL/hr at 03/11/22 0946       LOS: 5 days   Maria Dunlap  [facility]  03/11/2022, 11:16 AM

## 2022-03-11 NOTE — Progress Notes (Signed)
Mobility Specialist Progress Note   03/11/22 1330  Mobility  Activity Refused mobility   Patient declined ambulation for reasons that are unclear to writer. Was extremely agitated that procedure was canceled and was hard to re-orient, educate or encourage to participate. RN notified.   [place] Lindsee [place], [place], [place]  Office: [phone]

## 2022-03-11 NOTE — H&P (View-Only) (Signed)
     [facility] Progress Note  CC:  CBD stones  Subjective:  Feels fine.  No abdominal pain.  Wants to eat.  ERCP had to be pushed to 10/20.  Family is not very happy about it.  Objective:  Vital signs in last 24 hours: Temp:  [98.1 F (36.7 C)-98.8 F (37.1 C)] 98.8 F (37.1 C) (10/19 0915) Pulse Rate:  [62-85] 62 (10/19 0915) Resp:  [15-18] 15 (10/19 0915) BP: (119-123)/(77-89) 122/78 (10/19 0915) SpO2:  [98 %-100 %] 100 % (10/19 0915) Last BM Date : 03/10/22 General:  Alert, Well-developed, in NAD; pleasantly confused. Heart:  Regular rate and rhythm; no murmurs Pulm:  CTAB.  No W/R/R. Abdomen:  Soft, non-distended.  BS present.  Non-tender. Extremities:  Without edema.  Intake/Output from previous day: 10/18 0701 - 10/19 0700 In: 480 [P.O.:480] Out: -   Lab Results: Recent Labs    03/09/22 0350 03/11/22 0327  WBC 4.0 4.4  HGB 7.5* 7.6*  HCT 23.6* 23.7*  PLT 172 198   BMET Recent Labs    03/09/22 0350 03/10/22 0433 03/11/22 0327  NA 137 138 139  K 3.1* 3.3* 4.0  CL 108 107 111  CO2 24 24 22   GLUCOSE 75 86 79  BUN 6* <5* <5*  CREATININE 0.50 0.61 0.68  CALCIUM 8.0* 8.1* 8.3*   LFT Recent Labs    03/11/22 0327  PROT 4.1*  ALBUMIN 1.8*  AST 29  ALT 16  ALKPHOS 90  BILITOT 1.0   Assessment / Plan: *Acute, mild pancreatitis.  Lipase improved/normalized.  Clinically with resolved abdominal pain.  Looks to be biliary pancreatitis.  LFTs now normal.   *History of cirrhosis due to alcohol, however, her liver is normal on MRI and CT.  Platelets are normal.   *Memory impairment.  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  Memory impairment persists.  *Hypoalbuminemia.  Suspect some level of protein calorie malnutrition.   *Normocytic anemia.  Hb 7.6, which is in range from 6 weeks ago.  -ERCP was cancelled by ENDO and anesthesia staff due insufficient staffing and inability to accommodate.  Will be pushed  to 10/20.  Ok for soft diet today and then NPO after midnight. -Trend labs.    LOS: 5 days   Laban Emperor. Zehr  03/11/2022, 1:19 PM     Attending Physician Note   I have taken an interval history, reviewed the chart and examined the patient. I performed a substantive portion of this encounter, including complete performance of at least one of the key components, in conjunction with the APP. I agree with the APP's note, impression and recommendations with my edits. My additional impressions and recommendations are as follows.   Mild biliary pancreatitis, resolving nicely. CBD stones on MRCP. ERCP cancelled today by Endo unit/anesthesia and rescheduled for tomorrow.   History of alcohol abuse. History of Wernicke's encephalopathy. Question history of cirrhosis as her liver appears normal on MRI and CT. Platelets are normal.  Normocytic anemia.  Hb 7.6, which is in range from 6 weeks ago.  Lucio Edward, MD [facility] See AMION, [facility], for our on call provider

## 2022-03-11 NOTE — Progress Notes (Addendum)
     [facility] Progress Note  CC:  CBD stones  Subjective:  Feels fine.  No abdominal pain.  Wants to eat.  ERCP had to be pushed to 10/20.  Family is not very happy about it.  Objective:  Vital signs in last 24 hours: Temp:  [98.1 F (36.7 C)-98.8 F (37.1 C)] 98.8 F (37.1 C) (10/19 0915) Pulse Rate:  [62-85] 62 (10/19 0915) Resp:  [15-18] 15 (10/19 0915) BP: (119-123)/(77-89) 122/78 (10/19 0915) SpO2:  [98 %-100 %] 100 % (10/19 0915) Last BM Date : 03/10/22 General:  Alert, Well-developed, in NAD; pleasantly confused. Heart:  Regular rate and rhythm; no murmurs Pulm:  CTAB.  No W/R/R. Abdomen:  Soft, non-distended.  BS present.  Non-tender. Extremities:  Without edema.  Intake/Output from previous day: 10/18 0701 - 10/19 0700 In: 480 [P.O.:480] Out: -   Lab Results: Recent Labs    03/09/22 0350 03/11/22 0327  WBC 4.0 4.4  HGB 7.5* 7.6*  HCT 23.6* 23.7*  PLT 172 198   BMET Recent Labs    03/09/22 0350 03/10/22 0433 03/11/22 0327  NA 137 138 139  K 3.1* 3.3* 4.0  CL 108 107 111  CO2 24 24 22  GLUCOSE 75 86 79  BUN 6* <5* <5*  CREATININE 0.50 0.61 0.68  CALCIUM 8.0* 8.1* 8.3*   LFT Recent Labs    03/11/22 0327  PROT 4.1*  ALBUMIN 1.8*  AST 29  ALT 16  ALKPHOS 90  BILITOT 1.0   Assessment / Plan: *Acute, mild pancreatitis.  Lipase improved/normalized.  Clinically with resolved abdominal pain.  Looks to be biliary pancreatitis.  LFTs now normal.   *History of cirrhosis due to alcohol, however, her liver is normal on MRI and CT.  Platelets are normal.   *Memory impairment.  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  Memory impairment persists.  *Hypoalbuminemia.  Suspect some level of protein calorie malnutrition.   *Normocytic anemia.  Hb 7.6, which is in range from 6 weeks ago.  -ERCP was cancelled by ENDO and anesthesia staff due insufficient staffing and inability to accommodate.  Will be pushed  to 10/20.  Ok for soft diet today and then NPO after midnight. -Trend labs.    LOS: 5 days   Jessica D. Zehr  03/11/2022, 1:19 PM     Attending Physician Note   I have taken an interval history, reviewed the chart and examined the patient. I performed a substantive portion of this encounter, including complete performance of at least one of the key components, in conjunction with the APP. I agree with the APP's note, impression and recommendations with my edits. My additional impressions and recommendations are as follows.   Mild biliary pancreatitis, resolving nicely. CBD stones on MRCP. ERCP cancelled today by Endo unit/anesthesia and rescheduled for tomorrow.   History of alcohol abuse. History of Wernicke's encephalopathy. Question history of cirrhosis as her liver appears normal on MRI and CT. Platelets are normal.  Normocytic anemia.  Hb 7.6, which is in range from 6 weeks ago.  Caniya Tagle, MD FACG See AMION, [facility], for our on call provider  

## 2022-03-11 NOTE — Progress Notes (Signed)
[facility] Progress Note  Day of Surgery  Subjective: CC-  Daughter at bedside. Patient pleasantly confused, at baseline. Denies abdominal pain.  Objective: Vital signs in last 24 hours: Temp:  [98 F (36.7 C)-98.8 F (37.1 C)] 98.8 F (37.1 C) (10/19 0915) Pulse Rate:  [62-85] 62 (10/19 0915) Resp:  [15-18] 15 (10/19 0915) BP: (119-124)/(77-89) 122/78 (10/19 0915) SpO2:  [98 %-100 %] 100 % (10/19 0915) Last BM Date : 03/10/22  Intake/Output from previous day: 10/18 0701 - 10/19 0700 In: 480 [P.O.:480] Out: -  Intake/Output this shift: No intake/output data recorded.  PE: Gen:  Alert, NAD, pleasant Abd: soft, ND, NT  Lab Results:  Recent Labs    03/09/22 0350 03/11/22 0327  WBC 4.0 4.4  HGB 7.5* 7.6*  HCT 23.6* 23.7*  PLT 172 198   BMET Recent Labs    03/10/22 0433 03/11/22 0327  NA 138 139  K 3.3* 4.0  CL 107 111  CO2 24 22  GLUCOSE 86 79  BUN <5* <5*  CREATININE 0.61 0.68  CALCIUM 8.1* 8.3*   PT/INR Recent Labs    03/08/22 1149  LABPROT 14.8  INR 1.2   CMP     Component Value Date/Time   NA 139 03/11/2022 0327   NA 133 (L) 02/17/2022 1253   K 4.0 03/11/2022 0327   CL 111 03/11/2022 0327   CO2 22 03/11/2022 0327   GLUCOSE 79 03/11/2022 0327   BUN <5 (L) 03/11/2022 0327   BUN 8 02/17/2022 1253   CREATININE 0.68 03/11/2022 0327   CREATININE 0.67 10/23/2012 1024   CALCIUM 8.3 (L) 03/11/2022 0327   PROT 4.1 (L) 03/11/2022 0327   PROT 6.4 02/17/2022 1253   ALBUMIN 1.8 (L) 03/11/2022 0327   ALBUMIN 3.4 (L) 02/17/2022 1253   AST 29 03/11/2022 0327   ALT 16 03/11/2022 0327   ALKPHOS 90 03/11/2022 0327   BILITOT 1.0 03/11/2022 0327   BILITOT 0.7 02/17/2022 1253   GFRNONAA >60 03/11/2022 0327   GFRNONAA >89 10/23/2012 1024   GFRAA 92 09/17/2013 0844   GFRAA >89 10/23/2012 1024   Lipase     Component Value Date/Time   LIPASE 43 03/11/2022 0327       Studies/Results: No results  found.  Anti-infectives: Anti-infectives (From admission, onward)    Start     Dose/Rate Route Frequency Ordered Stop   03/07/22 0400  cefTRIAXone (ROCEPHIN) 1 g in sodium chloride 0.9 % 100 mL IVPB        1 g 200 mL/hr over 30 Minutes Intravenous Every 24 hours 03/06/22 2104 03/09/22 0346   03/06/22 2200  ceFEPIme (MAXIPIME) 2 g in sodium chloride 0.9 % 100 mL IVPB  Status:  Discontinued        2 g 200 mL/hr over 30 Minutes Intravenous Every 8 hours 03/06/22 1817 03/06/22 1822   03/06/22 2200  ceFEPIme (MAXIPIME) 2 g in sodium chloride 0.9 % 100 mL IVPB  Status:  Discontinued        2 g 200 mL/hr over 30 Minutes Intravenous Every 12 hours 03/06/22 1822 03/06/22 2104   03/06/22 1645  ceFEPIme (MAXIPIME) 2 g in sodium chloride 0.9 % 100 mL IVPB  Status:  Discontinued       See Hyperspace for full Linked Orders Report.   2 g 200 mL/hr over 30 Minutes Intravenous  Once 03/06/22 1640 03/06/22 1642   03/06/22 1645  metroNIDAZOLE (FLAGYL) IVPB 500 mg  Status:  Discontinued  See Hyperspace for full Linked Orders Report.   500 mg 100 mL/hr over 60 Minutes Intravenous  Once 03/06/22 1640 03/06/22 1642   03/06/22 1645  ceFEPIme (MAXIPIME) 2 g in sodium chloride 0.9 % 100 mL IVPB        2 g 200 mL/hr over 30 Minutes Intravenous  Once 03/06/22 1642 03/06/22 1741   03/06/22 1645  metroNIDAZOLE (FLAGYL) IVPB 500 mg        500 mg 100 mL/hr over 60 Minutes Intravenous  Once 03/06/22 1642 03/06/22 1933        Assessment/Plan Gallstone pancreatitis Choledocholithiasis - Going for ERCP today - Pending ERCP and if everything goes well, we will tentatively plan for laparoscopic cholecystectomy tomorrow. Repeat labs in AM. NPO after midnight. Discussed with patient and her daughter at bedside.   ID - none currently VTE - SCDs, lovenox FEN - IVF, soft diet Foley - none   Cirrhosis from prior alcohol use - labs ok and liver reported normal on MRCP Memory impairment, prior diagnosis of  Warnicke encephalopathy    I reviewed Consultant gastroenterology notes, hospitalist notes, last 24 h vitals and pain scores, last 48 h intake and output, last 24 h labs and trends, and last 24 h imaging results.    LOS: 5 days    [facility] 03/11/2022, 10:41 AM Please see Amion for pager number during day hours 7:00am-4:30pm

## 2022-03-11 NOTE — Plan of Care (Signed)

## 2022-03-12 ENCOUNTER — Inpatient Hospital Stay (HOSPITAL_COMMUNITY): Payer: Medicare Other | Admitting: Anesthesiology

## 2022-03-12 ENCOUNTER — Inpatient Hospital Stay (HOSPITAL_COMMUNITY): Payer: Medicare Other

## 2022-03-12 ENCOUNTER — Encounter (HOSPITAL_COMMUNITY): Payer: Self-pay | Admitting: Internal Medicine

## 2022-03-12 ENCOUNTER — Encounter (HOSPITAL_COMMUNITY)
Admission: EM | Disposition: A | Discharge status: Home Health | Payer: Self-pay | Source: Home / Self Care | Attending: Internal Medicine

## 2022-03-12 DIAGNOSIS — R933 Abnormal findings on diagnostic imaging of other parts of digestive tract: Secondary | ICD-10-CM | POA: Diagnosis not present

## 2022-03-12 DIAGNOSIS — K8050 Calculus of bile duct without cholangitis or cholecystitis without obstruction: Secondary | ICD-10-CM

## 2022-03-12 DIAGNOSIS — K851 Biliary acute pancreatitis without necrosis or infection: Secondary | ICD-10-CM | POA: Diagnosis not present

## 2022-03-12 DIAGNOSIS — Z87891 Personal history of nicotine dependence: Secondary | ICD-10-CM

## 2022-03-12 DIAGNOSIS — I10 Essential (primary) hypertension: Secondary | ICD-10-CM

## 2022-03-12 DIAGNOSIS — D649 Anemia, unspecified: Secondary | ICD-10-CM

## 2022-03-12 HISTORY — PX: SPHINCTEROTOMY: SHX5544

## 2022-03-12 HISTORY — PX: REMOVAL OF STONES: SHX5545

## 2022-03-12 HISTORY — PX: ENDOSCOPIC RETROGRADE CHOLANGIOPANCREATOGRAPHY (ERCP) WITH PROPOFOL: SHX5810

## 2022-03-12 LAB — COMPREHENSIVE METABOLIC PANEL
ALT: 16 U/L (ref 0–44)
AST: 27 U/L (ref 15–41)
Albumin: 1.8 g/dL — ABNORMAL LOW (ref 3.5–5.0)
Alkaline Phosphatase: 82 U/L (ref 38–126)
Anion gap: 9 (ref 5–15)
BUN: <5 mg/dL — ABNORMAL LOW (ref 8–23)
CO2: 24 mmol/L (ref 22–32)
Calcium: 8.2 mg/dL — ABNORMAL LOW (ref 8.9–10.3)
Chloride: 106 mmol/L (ref 98–111)
Creatinine, Ser: 0.69 mg/dL (ref 0.44–1.00)
GFR, Estimated: >60 mL/min (ref >60)
Glucose, Bld: 89 mg/dL (ref 70–99)
Potassium: 3.4 mmol/L — ABNORMAL LOW (ref 3.5–5.1)
Sodium: 139 mmol/L (ref 135–145)
Total Bilirubin: 0.6 mg/dL (ref 0.3–1.2)
Total Protein: 4.1 g/dL — ABNORMAL LOW (ref 6.5–8.1)

## 2022-03-12 LAB — LIPASE, BLOOD: Lipase: 47 U/L (ref 11–51)

## 2022-03-12 LAB — CBC
HCT: 22 % — ABNORMAL LOW (ref 36.0–46.0)
Hemoglobin: 7.3 g/dL — ABNORMAL LOW (ref 12.0–15.0)
MCH: 31.3 pg (ref 26.0–34.0)
MCHC: 33.2 g/dL (ref 30.0–36.0)
MCV: 94.4 fL (ref 80.0–100.0)
Platelets: 182 10*3/uL (ref 150–400)
RBC: 2.33 MIL/uL — ABNORMAL LOW (ref 3.87–5.11)
RDW: 14.6 % (ref 11.5–15.5)
WBC: 4.4 10*3/uL (ref 4.0–10.5)
nRBC: 0 % (ref 0.0–0.2)

## 2022-03-12 SURGERY — ENDOSCOPIC RETROGRADE CHOLANGIOPANCREATOGRAPHY (ERCP) WITH PROPOFOL
Anesthesia: General

## 2022-03-12 MED ORDER — LIDOCAINE 2% (20 MG/ML) 5 ML SYRINGE
INTRAMUSCULAR | Status: DC | PRN
  Administered 2022-03-12: 60 mg via INTRAVENOUS

## 2022-03-12 MED ORDER — ACETAMINOPHEN 10 MG/ML IV SOLN: 1000.0000 mg | Freq: Once | INTRAVENOUS | Status: DC | PRN

## 2022-03-12 MED ORDER — CIPROFLOXACIN IN D5W 400 MG/200ML IV SOLN
INTRAVENOUS | Status: AC
  Filled 2022-03-12: qty 200

## 2022-03-12 MED ORDER — PANTOPRAZOLE SODIUM 40 MG PO TBEC
40.0000 mg | DELAYED_RELEASE_TABLET | Freq: Every day | ORAL | Status: DC
  Administered 2022-03-12 – 2022-03-16 (×4): 40 mg via ORAL
  Filled 2022-03-12 (×4): qty 1

## 2022-03-12 MED ORDER — CIPROFLOXACIN IN D5W 400 MG/200ML IV SOLN
INTRAVENOUS | Status: DC | PRN
  Administered 2022-03-12: 400 mg via INTRAVENOUS

## 2022-03-12 MED ORDER — DICLOFENAC SUPPOSITORY 100 MG: 100.0000 mg | Freq: Once | RECTAL | Status: DC

## 2022-03-12 MED ORDER — ROCURONIUM BROMIDE 10 MG/ML (PF) SYRINGE
PREFILLED_SYRINGE | INTRAVENOUS | Status: DC | PRN
  Administered 2022-03-12: 30 mg via INTRAVENOUS

## 2022-03-12 MED ORDER — PHENYLEPHRINE HCL-NACL 20-0.9 MG/250ML-% IV SOLN
INTRAVENOUS | Status: DC | PRN
  Administered 2022-03-12: 25 ug/min via INTRAVENOUS

## 2022-03-12 MED ORDER — DICLOFENAC SUPPOSITORY 100 MG
RECTAL | Status: AC
  Filled 2022-03-12: qty 1

## 2022-03-12 MED ORDER — DEXAMETHASONE SODIUM PHOSPHATE 10 MG/ML IJ SOLN
INTRAMUSCULAR | Status: DC | PRN
  Administered 2022-03-12: 5 mg via INTRAVENOUS

## 2022-03-12 MED ORDER — ACETAMINOPHEN 500 MG PO TABS: 1000.0000 mg | ORAL_TABLET | Freq: Once | ORAL | Status: DC | PRN

## 2022-03-12 MED ORDER — GLUCAGON HCL RDNA (DIAGNOSTIC) 1 MG IJ SOLR
INTRAMUSCULAR | Status: AC
  Filled 2022-03-12: qty 2

## 2022-03-12 MED ORDER — ONDANSETRON HCL 4 MG/2ML IJ SOLN
INTRAMUSCULAR | Status: DC | PRN
  Administered 2022-03-12: 4 mg via INTRAVENOUS

## 2022-03-12 MED ORDER — FENTANYL CITRATE (PF) 100 MCG/2ML IJ SOLN
INTRAMUSCULAR | Status: DC | PRN
  Administered 2022-03-12 (×2): 50 ug via INTRAVENOUS

## 2022-03-12 MED ORDER — DICLOFENAC SUPPOSITORY 100 MG
RECTAL | Status: DC | PRN
  Administered 2022-03-12: 100 mg via RECTAL

## 2022-03-12 MED ORDER — SUGAMMADEX SODIUM 200 MG/2ML IV SOLN
INTRAVENOUS | Status: DC | PRN
  Administered 2022-03-12: 200 mg via INTRAVENOUS

## 2022-03-12 MED ORDER — SODIUM CHLORIDE 0.9 % IV SOLN
INTRAVENOUS | Status: DC | PRN
  Administered 2022-03-12: 43 mL

## 2022-03-12 MED ORDER — PHENYLEPHRINE 80 MCG/ML (10ML) SYRINGE FOR IV PUSH (FOR BLOOD PRESSURE SUPPORT): PREFILLED_SYRINGE | INTRAVENOUS | Status: DC | PRN

## 2022-03-12 MED ORDER — POTASSIUM CHLORIDE 20 MEQ PO PACK
40.0000 meq | PACK | Freq: Two times a day (BID) | ORAL | Status: AC
  Administered 2022-03-12 (×2): 40 meq via ORAL
  Filled 2022-03-12 (×2): qty 2

## 2022-03-12 MED ORDER — ACETAMINOPHEN 160 MG/5ML PO SOLN: 1000.0000 mg | Freq: Once | ORAL | Status: DC | PRN

## 2022-03-12 MED ORDER — CIPROFLOXACIN IN D5W 400 MG/200ML IV SOLN: 400.0000 mg | Freq: Once | INTRAVENOUS | Status: DC

## 2022-03-12 MED ORDER — GLUCAGON HCL RDNA (DIAGNOSTIC) 1 MG IJ SOLR
INTRAMUSCULAR | Status: DC | PRN
  Administered 2022-03-12 (×4): .25 mg via INTRAVENOUS

## 2022-03-12 MED ORDER — PROPOFOL 10 MG/ML IV BOLUS
INTRAVENOUS | Status: DC | PRN
  Administered 2022-03-12: 50 mg via INTRAVENOUS
  Administered 2022-03-12: 100 mg via INTRAVENOUS

## 2022-03-12 NOTE — Transfer of Care (Signed)
Immediate Anesthesia Transfer of Care Note  Patient: Maria Dunlap  Procedure(s) Performed: ENDOSCOPIC RETROGRADE CHOLANGIOPANCREATOGRAPHY (ERCP) WITH PROPOFOL  Patient Location: PACU  Anesthesia Type:General  Level of Consciousness: awake and confused  Airway & Oxygen Therapy: Patient Spontanous Breathing  Post-op Assessment: Report given to RN, Post -op Vital signs reviewed and stable and Patient moving all extremities X 4  Post vital signs: Reviewed and stable  Last Vitals:  Vitals Value Taken Time  BP 116/79 03/12/22 1231  Temp 36.3 C 03/12/22 1231  Pulse 82 03/12/22 1232  Resp 16 03/12/22 1232  SpO2 100 % 03/12/22 1232  Vitals shown include unvalidated device data.  Last Pain:  Vitals:   03/12/22 1231  TempSrc: Temporal  PainSc:          Complications: No notable events documented.

## 2022-03-12 NOTE — Anesthesia Procedure Notes (Signed)
Procedure Name: Intubation Date/Time: 03/12/2022 11:04 AM  Performed by: Gaylene Brooks, CRNAPre-anesthesia Checklist: Patient identified, Emergency Drugs available, Suction available and Patient being monitored Patient Re-evaluated:Patient Re-evaluated prior to induction Oxygen Delivery Method: Circle System Utilized Preoxygenation: Pre-oxygenation with 100% oxygen Induction Type: IV induction Ventilation: Mask ventilation without difficulty Laryngoscope Size: Miller and 2 Grade View: Grade I Tube type: Oral Tube size: 7.0 mm Number of attempts: 1 Airway Equipment and Method: Stylet and Oral airway Placement Confirmation: ETT inserted through vocal cords under direct vision, positive ETCO2 and breath sounds checked- equal and bilateral Secured at: 20 cm Tube secured with: Tape Dental Injury: Teeth and Oropharynx as per pre-operative assessment and Injury to lip  Comments: Small tear noted to upper lip. Gauze applied

## 2022-03-12 NOTE — Progress Notes (Signed)
Mobility Specialist: Progress Note   03/12/22 1537  Mobility  Activity Ambulated with assistance in hallway  Level of Assistance Contact guard assist, steadying assist  Assistive Device Front wheel walker  Distance Ambulated (ft) 280 ft  Activity Response Tolerated well  Mobility Referral Yes  $Mobility charge 1 Mobility   Pt received in the bed and agreeable to mobility. Mod I with bed mobility as well as to stand. Contact guard during ambulation. Pt pleasantly confused, no c/o throughout. Pt back to bed after session with call bell and phone in reach.   [place]inton Laquiesha [place] Mobility Specialist Secure Chat Only

## 2022-03-12 NOTE — Anesthesia Preprocedure Evaluation (Signed)
Anesthesia Evaluation  Patient identified by MRN, date of birth, ID band Patient confused    Reviewed: Allergy & Precautions, NPO status , Patient's Chart, lab work & pertinent test results  History of Anesthesia Complications Negative for: history of anesthetic complications  Airway Mallampati: III  TM Distance: >3 FB Neck ROM: Full    Dental  (+) Dental Advisory Given, Teeth Intact   Pulmonary neg shortness of breath, neg sleep apnea, neg COPD, neg recent URI, former smoker   breath sounds clear to auscultation       Cardiovascular hypertension, (-) angina (-) Past MI and (-) CHF  Rhythm:Regular     Neuro/Psych negative neurological ROS     GI/Hepatic ,GERD  ,,Lab Results      Component                Value               Date                      ALT                      16                  03/12/2022                AST                      27                  03/12/2022                ALKPHOS                  82                  03/12/2022                BILITOT                  0.6                 03/12/2022              Endo/Other  negative endocrine ROS    Renal/GU negative Renal ROS     Musculoskeletal   Abdominal   Peds  Hematology  (+) Blood dyscrasia, anemia Lab Results      Component                Value               Date                      WBC                      4.4                 03/12/2022                HGB                      7.3 (L)             03/12/2022                HCT  22.0 (L)            03/12/2022                MCV                      94.4                03/12/2022                PLT                      182                 03/12/2022              Anesthesia Other Findings   Reproductive/Obstetrics                             Anesthesia Physical Anesthesia Plan  ASA: 3  Anesthesia Plan: General   Post-op Pain Management:  Minimal or no pain anticipated   Induction: Intravenous  PONV Risk Score and Plan: 3 and Ondansetron, Dexamethasone, Propofol infusion and TIVA  Airway Management Planned: Oral ETT  Additional Equipment: None  Intra-op Plan:   Post-operative Plan: Extubation in OR  Informed Consent: I have reviewed the patients History and Physical, chart, labs and discussed the procedure including the risks, benefits and alternatives for the proposed anesthesia with the patient or authorized representative who has indicated his/her understanding and acceptance.     Dental advisory given and Consent reviewed with POA  Plan Discussed with: CRNA  Anesthesia Plan Comments:        Anesthesia Quick Evaluation

## 2022-03-12 NOTE — Progress Notes (Signed)
[facility] Progress Note  Day of Surgery  Subjective: CC-  Husband at bedside. Patient without complaints this morning. Denies abdominal pain. Going for ERCP today.  Objective: Vital signs in last 24 hours: Temp:  [98.5 F (36.9 C)-98.8 F (37.1 C)] 98.5 F (36.9 C) (10/20 0423) Pulse Rate:  [62-74] 71 (10/20 0423) Resp:  [15-16] 15 (10/20 0423) BP: (101-122)/(71-78) 108/74 (10/20 0423) SpO2:  [97 %-100 %] 98 % (10/20 0423) Last BM Date : 03/10/22  Intake/Output from previous day: No intake/output data recorded. Intake/Output this shift: No intake/output data recorded.  PE: Gen:  Alert, NAD, pleasant Abd: soft, ND, NT   Lab Results:  Recent Labs    03/11/22 0327 03/12/22 0323  WBC 4.4 4.4  HGB 7.6* 7.3*  HCT 23.7* 22.0*  PLT 198 182   BMET Recent Labs    03/11/22 0327 03/12/22 0323  NA 139 139  K 4.0 3.4*  CL 111 106  CO2 22 24  GLUCOSE 79 89  BUN <5* <5*  CREATININE 0.68 0.69  CALCIUM 8.3* 8.2*   PT/INR No results for input(s): "LABPROT", "INR" in the last 72 hours. CMP     Component Value Date/Time   NA 139 03/12/2022 0323   NA 133 (L) 02/17/2022 1253   K 3.4 (L) 03/12/2022 0323   CL 106 03/12/2022 0323   CO2 24 03/12/2022 0323   GLUCOSE 89 03/12/2022 0323   BUN <5 (L) 03/12/2022 0323   BUN 8 02/17/2022 1253   CREATININE 0.69 03/12/2022 0323   CREATININE 0.67 10/23/2012 1024   CALCIUM 8.2 (L) 03/12/2022 0323   PROT 4.1 (L) 03/12/2022 0323   PROT 6.4 02/17/2022 1253   ALBUMIN 1.8 (L) 03/12/2022 0323   ALBUMIN 3.4 (L) 02/17/2022 1253   AST 27 03/12/2022 0323   ALT 16 03/12/2022 0323   ALKPHOS 82 03/12/2022 0323   BILITOT 0.6 03/12/2022 0323   BILITOT 0.7 02/17/2022 1253   GFRNONAA >60 03/12/2022 0323   GFRNONAA >89 10/23/2012 1024   GFRAA 92 09/17/2013 0844   GFRAA >89 10/23/2012 1024   Lipase     Component Value Date/Time   LIPASE 47 03/12/2022 0323       Studies/Results: No results  found.  Anti-infectives: Anti-infectives (From admission, onward)    Start     Dose/Rate Route Frequency Ordered Stop   03/07/22 0400  cefTRIAXone (ROCEPHIN) 1 g in sodium chloride 0.9 % 100 mL IVPB        1 g 200 mL/hr over 30 Minutes Intravenous Every 24 hours 03/06/22 2104 03/09/22 0346   03/06/22 2200  ceFEPIme (MAXIPIME) 2 g in sodium chloride 0.9 % 100 mL IVPB  Status:  Discontinued        2 g 200 mL/hr over 30 Minutes Intravenous Every 8 hours 03/06/22 1817 03/06/22 1822   03/06/22 2200  ceFEPIme (MAXIPIME) 2 g in sodium chloride 0.9 % 100 mL IVPB  Status:  Discontinued        2 g 200 mL/hr over 30 Minutes Intravenous Every 12 hours 03/06/22 1822 03/06/22 2104   03/06/22 1645  ceFEPIme (MAXIPIME) 2 g in sodium chloride 0.9 % 100 mL IVPB  Status:  Discontinued       See Hyperspace for full Linked Orders Report.   2 g 200 mL/hr over 30 Minutes Intravenous  Once 03/06/22 1640 03/06/22 1642   03/06/22 1645  metroNIDAZOLE (FLAGYL) IVPB 500 mg  Status:  Discontinued       See Hyperspace  for full Linked Orders Report.   500 mg 100 mL/hr over 60 Minutes Intravenous  Once 03/06/22 1640 03/06/22 1642   03/06/22 1645  ceFEPIme (MAXIPIME) 2 g in sodium chloride 0.9 % 100 mL IVPB        2 g 200 mL/hr over 30 Minutes Intravenous  Once 03/06/22 1642 03/06/22 1741   03/06/22 1645  metroNIDAZOLE (FLAGYL) IVPB 500 mg        500 mg 100 mL/hr over 60 Minutes Intravenous  Once 03/06/22 1642 03/06/22 1933        Assessment/Plan Gallstone pancreatitis Choledocholithiasis - Going for ERCP today - Pending ERCP and if everything goes well, we will tentatively plan for laparoscopic cholecystectomy tomorrow. Repeat labs in AM. NPO after midnight. Discussed with patient and her husband at bedside.  ID - none currently VTE - SCDs, lovenox FEN - IVF, soft diet Foley - none   Cirrhosis from prior alcohol use - labs ok and liver reported normal on MRCP Memory impairment, prior diagnosis of  Warnicke encephalopathy    I reviewed Consultant gastroenterology notes, hospitalist notes, last 24 h vitals and pain scores, last 48 h intake and output, last 24 h labs and trends, and last 24 h imaging results.     LOS: 6 days    [facility] 03/12/2022, 7:28 AM Please see Amion for pager number during day hours 7:00am-4:30pm

## 2022-03-12 NOTE — Op Note (Signed)
[facility] Patient Name: Maria Dunlap Procedure Date : 03/12/2022 MRN: [id number] Attending MD: Meryl Dare , MD Date of Birth: 03-14-1951 CSN: [id number] Age: 71 Admit Type: Inpatient Procedure:                ERCP Indications:              Bile duct stone(s), Abnormal MRCP Providers:                Venita Lick. Russella Dar, MD, Fransisca Connors, Kandice Robinsons, Technician Referring MD:             [facility] Medicines:                General Anesthesia Complications:            No immediate complications. Estimated Blood Loss:     Estimated blood loss was minimal. Procedure:                Pre-Anesthesia Assessment:                           - Prior to the procedure, a History and Physical                            was performed, and patient medications and                            allergies were reviewed. The patient's tolerance of                            previous anesthesia was also reviewed. The risks                            and benefits of the procedure and the sedation                            options and risks were discussed with the patient.                            All questions were answered, and informed consent                            was obtained. Prior Anticoagulants: The patient has                            taken no previous anticoagulant or antiplatelet                            agents. ASA Grade Assessment: III - A patient with                            severe systemic disease. After reviewing the risks  and benefits, the patient was deemed in                            satisfactory condition to undergo the procedure.                           After obtaining informed consent, the scope was                            passed under direct vision. Throughout the                            procedure, the patient's blood pressure, pulse, and                            oxygen saturations  were monitored continuously. The                            [employer] D single use                            duodenoscope was introduced through the mouth, and                            used to inject contrast into and used to inject                            contrast into the bile duct. The ERCP was somewhat                            difficult due to challenging cannulation. The                            patient tolerated the procedure well. Findings:      The scout film was normal. The esophagus was successfully intubated       under direct vision. The scope was advanced to a small and under a fold       but otherwise normal major papilla in the descending duodenum without       detailed examination of the pharynx, larynx and associated structures,       and upper GI tract. The upper GI tract was grossly normal. A straight       Roadrunner wire was passed into the biliary tree after several attempts.       Prior to CBD cannulation a small false tract was created with the       guidewire and subsequent small submucosal injection occured. The       short-nosed traction sphincterotome was passed over the guidewire and       the bile duct was then deeply cannulated. Contrast was injected. I       personally interpreted the bile duct images. There was appropriate flow       of contrast through the ducts. The common bile duct was diffusely       dilated, with a stone causing an obstruction. The largest diameter was  14 mm. The common bile duct contained filling defects thought to be a       stone and sludge. An 8 mm biliary sphincterotomy was made with a       traction (standard) sphincterotome using ERBE electrocautery. There was       self limited oozing from the sphincterotomy which did not require       treatment. The biliary tree was swept with a 12-15 mm balloon starting       at the bifurcation. Sludge was swept from the duct. All stones were       removed.  The distal CBD was tapered and tortuous. The PD was not       cannulated or injected by intention. Good biliary drainage was noted at       the end of the procedure. Impression:               - Filling defects consistent with a stone and                            sludge was seen on the cholangiogram.                           - The common bile duct was dilated, with a stone                            causing an obstruction.                           - Cholelithiasis.                           - Choledocholithiasis was found. Complete removal                            was accomplished by biliary sphincterotomy and                            balloon extraction.                           - A biliary sphincterotomy was performed. Recommendation:           - Return patient to hospital ward for ongoing care.                           - Observe patient's clinical course following                            today's ERCP with therapeutic intervention.                           - Surgery is following with plans for lap                            cholecystectomy. Procedure Code(s):        --- Professional ---  [phone], Endoscopic retrograde                            cholangiopancreatography (ERCP); with removal of                            calculi/debris from biliary/pancreatic duct(s)                           43262, Endoscopic retrograde                            cholangiopancreatography (ERCP); with                            sphincterotomy/papillotomy Diagnosis Code(s):        --- Professional ---                           R93.2, Abnormal findings on diagnostic imaging of                            liver and biliary tract                           K80.51, Calculus of bile duct without cholangitis                            or cholecystitis with obstruction CPT copyright 2019 American Medical Association. All rights reserved. The codes documented in this report are  preliminary and upon coder review may  be revised to meet current compliance requirements. Meryl Dare, MD 03/12/2022 12:32:33 PM This report has been signed electronically. Number of Addenda: 0

## 2022-03-12 NOTE — Interval H&P Note (Signed)
History and Physical Interval Note:  03/12/2022 10:56 AM  Maria Dunlap  has presented today for surgery, with the diagnosis of Choledocholithiasis..  The various methods of treatment have been discussed with the patient and family. After consideration of risks, benefits and other options for treatment, the patient has consented to  Procedure(s): ENDOSCOPIC RETROGRADE CHOLANGIOPANCREATOGRAPHY (ERCP) WITH PROPOFOL (N/A) as a surgical intervention.  The patient's history has been reviewed, patient examined, no change in status, stable for surgery.  I have reviewed the patient's chart and labs.  Questions were answered to the patient's satisfaction.     Pricilla Riffle. Fuller Plan

## 2022-03-12 NOTE — Progress Notes (Signed)
[facility] PROGRESS NOTE    Progress Note  Maria Dunlap  PRF:[id number] DOB: July 11, 1950 DOA: 03/06/2022 PCP: Sonny Masters, FNP     Brief Narrative:   Maria Dunlap is an 71 y.o. female past medical history significant for alcohol abuse, comes into the clinic with abdominal pain and vomiting along with multiple experience.  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  Gallstone pancreatitis GI was consulted who recommended transfer to [facility] possible ERCP, MRCP done that showed common bile duct with a 14 mm stone.    Assessment/Plan:   Acute gallstone pancreatitis CT findings suggestive of acute pancreatitis MRCP confirmed CBD stone of 14 mm. General surgery GI was consulted recommended transfer to [facility]. GI recommended ERCP needed to be rescheduled for today. Continue current diet.  Elevated lactic acid: Borderline elevated pancreatitis she is not septic.  Lower UTI: She reported dysuria, she reported he was on a course of antibiotics about 2-1/2 weeks ago. Was started on Rocephin for 3 days. Urine cultures were sent which are negative.  Prolonged QTc: Try the potassium greater than 4 magnesium greater than 2.  Normocytic anemia: Hemoglobin has remained relatively stable at 7.5.  Hypokalemia: Has improved, but is low again this morning replete orally.    DVT prophylaxis: lovenox Family Communication:daughter Status is: Inpatient Remains inpatient appropriate because: Acute gallstone hepatitis.    Code Status:     Code Status Orders  (From admission, onward)           Start     Ordered   03/06/22 2102  Full code  Continuous        03/06/22 2104           Code Status History     Date Active Date Inactive Code  Status Order ID Comments User Context   01/22/2022 2029 01/29/2022 1808 Full Code [id number]  Elgergawy, Leana Roe, MD Inpatient         IV Access:   Peripheral IV   Procedures and diagnostic studies:   No results found.   Medical Consultants:   None.   Subjective:    [name] pain is controlled.  Objective:    Vitals:   03/11/22 1647 03/11/22 1928 03/12/22 0423 03/12/22 0803  BP: 101/71 105/76 108/74 124/80  Pulse: 72 74 71 61  Resp: 16 15 15 16   Temp: 98.6 F (37 C) 98.5 F (36.9 C) 98.5 F (36.9 C) 98.3 F (36.8 C)  TempSrc: Oral Oral Oral Oral  SpO2: 97% 100% 98% 99%  Weight:      Height:       SpO2: 99 %  No intake or output data in the 24 hours ending 03/12/22 1030  Filed Weights   03/06/22 1821  Weight: 51.5 kg    Exam: General exam: In no acute distress. Respiratory system: Good air movement and clear to auscultation. Cardiovascular system: S1 & S2 heard, RRR. No JVD. Gastrointestinal system: Abdomen is nondistended, soft and nontender.  Extremities: No pedal edema. Skin: No rashes, lesions or ulcers Data Reviewed:    Labs: Basic Metabolic Panel: Recent Labs  Lab 03/06/22 1530 03/07/22 0451 03/08/22 0440 03/09/22 0350 03/10/22 0433 03/11/22 0327 03/12/22 0323  NA 136 136 136 137 138 139 139  K 3.2* 3.9 3.3* 3.1* 3.3* 4.0 3.4*  CL 106 109 109 108 107 111 106  CO2 18* 20* 22 24 24 22 24   GLUCOSE 150* 76 72 75 86 79 89  BUN 11 11 8  6* <5* <5* <5*  CREATININE 0.78 0.59 0.60 0.50 0.61 0.68 0.69  CALCIUM 9.0 8.4* 7.9* 8.0* 8.1* 8.3* 8.2*  MG 1.5* 2.2  --   --   --   --   --     GFR Estimated Creatinine Clearance: 47.3 mL/min (by C-G formula based on SCr of 0.69 mg/dL). Liver Function Tests: Recent Labs  Lab 03/08/22 0440 03/09/22 0350 03/10/22 0433 03/11/22 0327 03/12/22 0323  AST 34 30 28 29 27   ALT 19 18 16 16 16   ALKPHOS 107 106 96 90 82  BILITOT 1.2 0.9 0.7 1.0 0.6  PROT 4.0* 4.2* 3.9* 4.1* 4.1*  ALBUMIN  1.8* 1.8* 1.7* 1.8* 1.8*    Recent Labs  Lab 03/06/22 1530 03/07/22 0451 03/08/22 0440 03/11/22 0327 03/12/22 0323  LIPASE 1,643* 261* 54* 43 47    Recent Labs  Lab 03/08/22 1149  AMMONIA <10    Coagulation profile Recent Labs  Lab 03/06/22 1553 03/08/22 1149  INR 1.0 1.2    COVID-19 Labs  No results for input(s): "DDIMER", "FERRITIN", "LDH", "CRP" in the last 72 hours.   No results found for: "SARSCOV2NAA"  CBC: Recent Labs  Lab 03/06/22 1530 03/07/22 0451 03/08/22 0440 03/09/22 0350 03/11/22 0327 03/12/22 0323  WBC 10.4 7.1 4.9 4.0 4.4 4.4  NEUTROABS 8.0*  --   --   --   --   --   HGB 10.5* 8.8* 7.2* 7.5* 7.6* 7.3*  HCT 32.3* 28.3* 22.8* 23.6* 23.7* 22.0*  MCV 95.6 98.3 97.4 97.5 95.2 94.4  PLT 263 200 164 172 198 182    Cardiac Enzymes: No results for input(s): "CKTOTAL", "CKMB", "CKMBINDEX", "TROPONINI" in the last 168 hours. BNP (last 3 results) No results for input(s): "PROBNP" in the last 8760 hours. CBG: Recent Labs  Lab 03/07/22 0045  GLUCAP 91    D-Dimer: No results for input(s): "DDIMER" in the last 72 hours. Hgb A1c: No results for input(s): "HGBA1C" in the last 72 hours. Lipid Profile: No results for input(s): "CHOL", "HDL", "LDLCALC", "TRIG", "CHOLHDL", "LDLDIRECT" in the last 72 hours. Thyroid function studies: No results for input(s): "TSH", "T4TOTAL", "T3FREE", "THYROIDAB" in the last 72 hours.  Invalid input(s): "FREET3" Anemia work up: No results for input(s): "VITAMINB12", "FOLATE", "FERRITIN", "TIBC", "IRON", "RETICCTPCT" in the last 72 hours.  Sepsis Labs: Recent Labs  Lab 03/06/22 1540 03/06/22 1743 03/06/22 2318 03/07/22 0451 03/08/22 0440 03/09/22 0350 03/11/22 0327 03/12/22 0323  WBC  --   --   --    < > 4.9 4.0 4.4 4.4  LATICACIDVEN 3.6* 3.5* 1.9  --   --   --   --   --    < > = values in this interval not displayed.    Microbiology Recent Results (from the past 240 hour(s))  Culture, blood  (single)     Status: None   Collection Time: 03/06/22  4:49 PM   Specimen: BLOOD  Result Value Ref Range Status   Specimen Description   Final    BLOOD RIGHT ANTECUBITAL BOTTLES DRAWN AEROBIC AND ANAEROBIC   Special Requests   Final    Blood Culture results may not be optimal due to an excessive volume of blood received in culture bottles   Culture   Final    NO GROWTH 5 DAYS  Performed at [facility], [address], [place], [place] [address]    Report Status 03/11/2022 FINAL  Final  Urine Culture     Status: None   Collection Time: 03/06/22  5:52 PM   Specimen: Urine, Catheterized  Result Value Ref Range Status   Specimen Description   Final    URINE, CATHETERIZED Performed at [facility], [address] [place], [place] [address]    Special Requests   Final    NONE Performed at [facility], [address] [place], [place] [address]    Culture   Final    NO GROWTH Performed at [facility], [address], [place], [place] [address]    Report Status 03/08/2022 FINAL  Final  Surgical pcr screen     Status: Abnormal   Collection Time: 03/10/22  4:42 PM   Specimen: Nasal Mucosa; Nasal Swab  Result Value Ref Range Status   MRSA, PCR POSITIVE (A) NEGATIVE Final    Comment: RESULT CALLED TO, READ BACK BY AND VERIFIED WITH: RN SIMON 03/10/22 @2210      Staphylococcus aureus POSITIVE (A) NEGATIVE Final    Comment: (NOTE) The Xpert SA Assay (FDA approved for NASAL specimens in patients 41 years of age and older), is one component of a comprehensive surveillance program. It is not intended to diagnose infection nor to guide or monitor treatment. Performed at [facility], [address], [place] [employer], [place]      Medications:    Chlorhexidine Gluconate Cloth  6 each Topical Q0600   enoxaparin (LOVENOX) injection  40 mg Subcutaneous Q24H   melatonin  6 mg Oral QHS   mupirocin ointment  1 Application Nasal BID   Continuous Infusions:  lactated  ringers 30 mL/hr at 03/11/22 0946      LOS: 6 days   03/13/22  [facility]  03/12/2022, 10:30 AM

## 2022-03-13 DIAGNOSIS — K851 Biliary acute pancreatitis without necrosis or infection: Secondary | ICD-10-CM | POA: Diagnosis not present

## 2022-03-13 DIAGNOSIS — R933 Abnormal findings on diagnostic imaging of other parts of digestive tract: Secondary | ICD-10-CM | POA: Diagnosis not present

## 2022-03-13 DIAGNOSIS — K8050 Calculus of bile duct without cholangitis or cholecystitis without obstruction: Secondary | ICD-10-CM | POA: Diagnosis not present

## 2022-03-13 LAB — BASIC METABOLIC PANEL
Anion gap: 7 (ref 5–15)
BUN: <5 mg/dL — ABNORMAL LOW (ref 8–23)
CO2: 22 mmol/L (ref 22–32)
Calcium: 8.1 mg/dL — ABNORMAL LOW (ref 8.9–10.3)
Chloride: 108 mmol/L (ref 98–111)
Creatinine, Ser: 0.85 mg/dL (ref 0.44–1.00)
GFR, Estimated: >60 mL/min (ref >60)
Glucose, Bld: 109 mg/dL — ABNORMAL HIGH (ref 70–99)
Potassium: 4.6 mmol/L (ref 3.5–5.1)
Sodium: 137 mmol/L (ref 135–145)

## 2022-03-13 MED ORDER — QUETIAPINE FUMARATE 25 MG PO TABS
25.0000 mg | ORAL_TABLET | Freq: Every evening | ORAL | Status: DC | PRN
  Administered 2022-03-13: 25 mg via ORAL
  Filled 2022-03-13: qty 1

## 2022-03-13 NOTE — Progress Notes (Signed)
[facility] PROGRESS NOTE    Progress Note  Maria Dunlap  RXV:[id number] DOB: 1951/04/02 DOA: 03/06/2022 PCP: Maria Masters, FNP     Brief Narrative:   Maria Dunlap is an 71 y.o. female past medical history significant for alcohol abuse, comes into the clinic with abdominal pain and vomiting along with multiple experience.  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  Gallstone pancreatitis GI was consulted who recommended transfer to [facility] possible ERCP, MRCP done that showed common bile duct with a 14 mm stone.    Assessment/Plan:   Acute gallstone pancreatitis CT findings suggestive of acute pancreatitis MRCP confirmed CBD stone of 14 mm. General surgery GI was consulted recommended transfer to [facility]. 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 General surgery was consulted who recommended cholecystectomy probably on 03/15/2022.  Elevated lactic acid: Borderline elevated pancreatitis she is not septic.  Lower UTI: She reported dysuria, she reported he was on a course of antibiotics about 2-1/2 weeks ago. Was started on Rocephin for 3 days. Urine cultures were sent which are negative.  Prolonged QTc: Try the potassium greater than 4 magnesium greater than 2.  Normocytic anemia: Hemoglobin has remained relatively stable at 7.5.  Hypokalemia: Has improved, but is low again this morning replete orally.    DVT prophylaxis: lovenox Family Communication:daughter Status is: Inpatient Remains inpatient appropriate because: Acute gallstone hepatitis.    Code Status:     Code Status Orders  (From admission, onward)           Start     Ordered   03/06/22 2102  Full code  Continuous        03/06/22 2104            Code Status History     Date Active Date Inactive Code Status Order ID Comments User Context   01/22/2022 2029 01/29/2022 1808 Full Code [id number]  Elgergawy, Leana Roe, MD Inpatient         IV Access:   Peripheral IV   Procedures and diagnostic studies:   DG ERCP  Result Date: 03/14/2022 CLINICAL DATA:  71 year old female with cholelithiasis EXAM: ERCP TECHNIQUE: Multiple spot images obtained with the fluoroscopic device and submitted for interpretation post-procedure. FLUOROSCOPY: Radiation Exposure Index (as provided by the fluoroscopic device): 39 mGy Kerma COMPARISON:  MR 03/08/2022 FINDINGS: Limited images during ERCP. Initial image demonstrates endoscope projecting over the upper abdomen. There is subsequently placement of a safety wire and retrograde infusion of contrast. Deployment of a retrieval balloon. IMPRESSION: Limited images during ERCP demonstrates deployment of retrieval balloon within the common bile duct. Please refer to the dictated operative report for full details of intraoperative findings and procedure. Electronically Signed   By: Gilmer Mor D.O.   On: 2022/03/14 13:08     Medical Consultants:   None.   Subjective:    Maria Larose RobertsonIs tolerating her diet.  Objective:    Vitals:   2022/03/14 1657 03-14-2022 1952 03/13/22 0354 03/13/22 0743  BP: 113/83 109/81 107/66 108/64  Pulse: 76 72 69 60  Resp: 16 16 16 18   Temp: 98 F (36.7 C) 97.9 F (36.6 C) 97.8 F (36.6 C) 97.6 F (36.4 C)  TempSrc: Oral Oral Oral Oral  SpO2: 99% 99% 99% 98%  Weight:  Height:       SpO2: 98 %   Intake/Output Summary (Last 24 hours) at 03/13/2022 1025 Last data filed at 03/13/2022 0900 Gross per 24 hour  Intake 1360 ml  Output --  Net 1360 ml    Filed Weights   03/06/22 1821  Weight: 51.5 kg    Exam: General exam: In no acute distress. Respiratory system: Good air movement and clear to auscultation. Cardiovascular system: S1 & S2  heard, RRR. No JVD. Gastrointestinal system: Abdomen is nondistended, soft and nontender.  Extremities: No pedal edema. Skin: No rashes, lesions or ulcers Psychiatry: Judgement and insight appear normal. Mood & affect appropriate. Data Reviewed:    Labs: Basic Metabolic Panel: Recent Labs  Lab 03/06/22 1530 03/07/22 0451 03/08/22 0440 03/09/22 0350 03/10/22 0433 03/11/22 0327 03/12/22 0323 03/13/22 0420  NA 136 136   < > 137 138 139 139 137  K 3.2* 3.9   < > 3.1* 3.3* 4.0 3.4* 4.6  CL 106 109   < > 108 107 111 106 108  CO2 18* 20*   < > 24 24 22 24 22   GLUCOSE 150* 76   < > 75 86 79 89 109*  BUN 11 11   < > 6* <5* <5* <5* <5*  CREATININE 0.78 0.59   < > 0.50 0.61 0.68 0.69 0.85  CALCIUM 9.0 8.4*   < > 8.0* 8.1* 8.3* 8.2* 8.1*  MG 1.5* 2.2  --   --   --   --   --   --    < > = values in this interval not displayed.    GFR Estimated Creatinine Clearance: 44.5 mL/min (by C-G formula based on SCr of 0.85 mg/dL). Liver Function Tests: Recent Labs  Lab 03/08/22 0440 03/09/22 0350 03/10/22 0433 03/11/22 0327 03/12/22 0323  AST 34 30 28 29 27   ALT 19 18 16 16 16   ALKPHOS 107 106 96 90 82  BILITOT 1.2 0.9 0.7 1.0 0.6  PROT 4.0* 4.2* 3.9* 4.1* 4.1*  ALBUMIN 1.8* 1.8* 1.7* 1.8* 1.8*    Recent Labs  Lab 03/06/22 1530 03/07/22 0451 03/08/22 0440 03/11/22 0327 03/12/22 0323  LIPASE 1,643* 261* 54* 43 47    Recent Labs  Lab 03/08/22 1149  AMMONIA <10    Coagulation profile Recent Labs  Lab 03/06/22 1553 03/08/22 1149  INR 1.0 1.2    COVID-19 Labs  No results for input(s): "DDIMER", "FERRITIN", "LDH", "CRP" in the last 72 hours.   No results found for: "SARSCOV2NAA"  CBC: Recent Labs  Lab 03/06/22 1530 03/07/22 0451 03/08/22 0440 03/09/22 0350 03/11/22 0327 03/12/22 0323  WBC 10.4 7.1 4.9 4.0 4.4 4.4  NEUTROABS 8.0*  --   --   --   --   --   HGB 10.5* 8.8* 7.2* 7.5* 7.6* 7.3*  HCT 32.3* 28.3* 22.8* 23.6* 23.7* 22.0*  MCV 95.6 98.3 97.4  97.5 95.2 94.4  PLT 263 200 164 172 198 182    Cardiac Enzymes: No results for input(s): "CKTOTAL", "CKMB", "CKMBINDEX", "TROPONINI" in the last 168 hours. BNP (last 3 results) No results for input(s): "PROBNP" in the last 8760 hours. CBG: Recent Labs  Lab 03/07/22 0045  GLUCAP 91    D-Dimer: No results for input(s): "DDIMER" in the last 72 hours. Hgb A1c: No results for input(s): "HGBA1C" in the last 72 hours. Lipid Profile: No results for input(s): "CHOL", "HDL", "LDLCALC", "TRIG", "CHOLHDL", "LDLDIRECT" in the last 72 hours. Thyroid function studies: No  results for input(s): "TSH", "T4TOTAL", "T3FREE", "THYROIDAB" in the last 72 hours.  Invalid input(s): "FREET3" Anemia work up: No results for input(s): "VITAMINB12", "FOLATE", "FERRITIN", "TIBC", "IRON", "RETICCTPCT" in the last 72 hours.  Sepsis Labs: Recent Labs  Lab 03/06/22 1540 03/06/22 1743 03/06/22 2318 03/07/22 0451 03/08/22 0440 03/09/22 0350 03/11/22 0327 03/12/22 0323  WBC  --   --   --    < > 4.9 4.0 4.4 4.4  LATICACIDVEN 3.6* 3.5* 1.9  --   --   --   --   --    < > = values in this interval not displayed.    Microbiology Recent Results (from the past 240 hour(s))  Culture, blood (single)     Status: None   Collection Time: 03/06/22  4:49 PM   Specimen: BLOOD  Result Value Ref Range Status   Specimen Description   Final    BLOOD RIGHT ANTECUBITAL BOTTLES DRAWN AEROBIC AND ANAEROBIC   Special Requests   Final    Blood Culture results may not be optimal due to an excessive volume of blood received in culture bottles   Culture   Final    NO GROWTH 5 DAYS Performed at [facility], [address], [place], [place] [address]    Report Status 03/11/2022 FINAL  Final  Urine Culture     Status: None   Collection Time: 03/06/22  5:52 PM   Specimen: Urine, Catheterized  Result Value Ref Range Status   Specimen Description   Final    URINE, CATHETERIZED Performed at [facility], [address], [place], [place] [address]    Special Requests   Final    NONE Performed at [facility], [address], [place], [place] [address]    Culture   Final    NO GROWTH Performed at [facility], [address], [place], [place] [address]    Report Status 03/08/2022 FINAL  Final  Surgical pcr screen     Status: Abnormal   Collection Time: 03/10/22  4:42 PM   Specimen: Nasal Mucosa; Nasal Swab  Result Value Ref Range Status   MRSA, PCR POSITIVE (A) NEGATIVE Final    Comment: RESULT CALLED TO, READ BACK BY AND VERIFIED WITH: RN SIMON 03/10/22 @2210      Staphylococcus aureus POSITIVE (A) NEGATIVE Final    Comment: (NOTE) The Xpert SA Assay (FDA approved for NASAL specimens in patients 33 years of age and older), is one component of a comprehensive surveillance program. It is not intended to diagnose infection nor to guide or monitor treatment. Performed at [facility], [address] [place], [place]      Medications:    Chlorhexidine Gluconate Cloth  6 each Topical Q0600   diclofenac  100 mg Rectal Once   enoxaparin (LOVENOX) injection  40 mg Subcutaneous Q24H   melatonin  6 mg Oral QHS   mupirocin ointment  1 Application Nasal BID   pantoprazole  40 mg Oral Q0600   Continuous Infusions:  lactated ringers 30 mL/hr at 03/12/22 1058      LOS: 7 days   03/14/22  [facility]  03/13/2022, 10:25 AM

## 2022-03-13 NOTE — Progress Notes (Addendum)
    Progress Note   Assessment    Mild biliary pancreatitis, resolved.  CBD stones S/P ERCP sphincterotomy, stone extraction yesterday   3.   History of alcohol abuse. History of Wernicke's encephalopathy. Question history of cirrhosis as her liver appears normal on MRI, CT and platelets, PT/INR are normal. 4.   Cholelithiasis 5.   Normocytic anemia.     Recommendations   Stable post ERCP Cholecystectomy per [facility] signing off and outpatient GI follow up with Dr. Jonelle Sports   Chief Complaint   Feels well, no abdominal pain  Vital signs in last 24 hours: Temp:  [97.4 F (36.3 C)-98 F (36.7 C)] 97.6 F (36.4 C) (10/21 0743) Pulse Rate:  [60-87] 60 (10/21 0743) Resp:  [13-24] 18 (10/21 0743) BP: (104-150)/(64-92) 108/64 (10/21 0743) SpO2:  [98 %-100 %] 98 % (10/21 0743) Last BM Date : 03/10/22  General: Alert, well-developed, in NAD Heart:  Regular rate and rhythm; no murmurs Chest: Clear to ascultation bilaterally Abdomen:  Soft, nontender and nondistended. Normal bowel sounds, without guarding, and without rebound.   Extremities:  Without edema. Neurologic:  Alert and  oriented x4; grossly normal neurologically. Psych:  Alert and cooperative. Normal mood and affect.  Intake/Output from previous day: 10/20 0701 - 10/21 0700 In: 1120 [P.O.:120; I.V.:800; IV Piggyback:200] Out: -  Intake/Output this shift: No intake/output data recorded.  Lab Results: Recent Labs    03/11/22 0327 03/12/22 0323  WBC 4.4 4.4  HGB 7.6* 7.3*  HCT 23.7* 22.0*  PLT 198 182   BMET Recent Labs    03/11/22 0327 03/12/22 0323 03/13/22 0420  NA 139 139 137  K 4.0 3.4* 4.6  CL 111 106 108  CO2 22 24 22   GLUCOSE 79 89 109*  BUN <5* <5* <5*  CREATININE 0.68 0.69 0.85  CALCIUM 8.3* 8.2* 8.1*   LFT Recent Labs    03/12/22 0323  PROT 4.1*  ALBUMIN 1.8*  AST 27  ALT 16  ALKPHOS 82  BILITOT 0.6    Studies/Results: DG ERCP  Result Date: 03/12/2022 CLINICAL DATA:   71 year old female with cholelithiasis EXAM: ERCP TECHNIQUE: Multiple spot images obtained with the fluoroscopic device and submitted for interpretation post-procedure. FLUOROSCOPY: Radiation Exposure Index (as provided by the fluoroscopic device): 39 mGy Kerma COMPARISON:  MR 03/08/2022 FINDINGS: Limited images during ERCP. Initial image demonstrates endoscope projecting over the upper abdomen. There is subsequently placement of a safety wire and retrograde infusion of contrast. Deployment of a retrieval balloon. IMPRESSION: Limited images during ERCP demonstrates deployment of retrieval balloon within the common bile duct. Please refer to the dictated operative report for full details of intraoperative findings and procedure. Electronically Signed   By: Corrie Mckusick D.O.   On: 03/12/2022 13:08      LOS: 7 days   Kerin Cecchi T. Fuller Plan, MD 03/13/2022, 8:22 AM See [facility], to contact our on call provider

## 2022-03-13 NOTE — Progress Notes (Signed)
[facility] Progress Note  1 Day Post-Op  Subjective: CC-  Husband at bedside. Patient without complaints this morning. Denies abdominal pain.  ERCP yesterday with stone extraction  Objective: Vital signs in last 24 hours: Temp:  [97.4 F (36.3 C)-98 F (36.7 C)] 97.6 F (36.4 C) (10/21 0743) Pulse Rate:  [60-87] 60 (10/21 0743) Resp:  [13-24] 18 (10/21 0743) BP: (104-150)/(64-92) 108/64 (10/21 0743) SpO2:  [98 %-100 %] 98 % (10/21 0743) Last BM Date : 03/10/22  Intake/Output from previous day: 10/20 0701 - 10/21 0700 In: 1120 [P.O.:120; I.V.:800; IV Piggyback:200] Out: -  Intake/Output this shift: No intake/output data recorded.  PE: Gen:  Alert, NAD, pleasant Abd: soft, ND, NT   Lab Results:  Recent Labs    03/11/22 0327 03/12/22 0323  WBC 4.4 4.4  HGB 7.6* 7.3*  HCT 23.7* 22.0*  PLT 198 182   BMET Recent Labs    03/12/22 0323 03/13/22 0420  NA 139 137  K 3.4* 4.6  CL 106 108  CO2 24 22  GLUCOSE 89 109*  BUN <5* <5*  CREATININE 0.69 0.85  CALCIUM 8.2* 8.1*   PT/INR No results for input(s): "LABPROT", "INR" in the last 72 hours. CMP     Component Value Date/Time   NA 137 03/13/2022 0420   NA 133 (L) 02/17/2022 1253   K 4.6 03/13/2022 0420   CL 108 03/13/2022 0420   CO2 22 03/13/2022 0420   GLUCOSE 109 (H) 03/13/2022 0420   BUN <5 (L) 03/13/2022 0420   BUN 8 02/17/2022 1253   CREATININE 0.85 03/13/2022 0420   CREATININE 0.67 10/23/2012 1024   CALCIUM 8.1 (L) 03/13/2022 0420   PROT 4.1 (L) 03/12/2022 0323   PROT 6.4 02/17/2022 1253   ALBUMIN 1.8 (L) 03/12/2022 0323   ALBUMIN 3.4 (L) 02/17/2022 1253   AST 27 03/12/2022 0323   ALT 16 03/12/2022 0323   ALKPHOS 82 03/12/2022 0323   BILITOT 0.6 03/12/2022 0323   BILITOT 0.7 02/17/2022 1253   GFRNONAA >60 03/13/2022 0420   GFRNONAA >89 10/23/2012 1024   GFRAA 92 09/17/2013 0844   GFRAA >89 10/23/2012 1024   Lipase     Component Value Date/Time   LIPASE 47 03/12/2022 0323        Studies/Results: DG ERCP  Result Date: 03/12/2022 CLINICAL DATA:  71 year old female with cholelithiasis EXAM: ERCP TECHNIQUE: Multiple spot images obtained with the fluoroscopic device and submitted for interpretation post-procedure. FLUOROSCOPY: Radiation Exposure Index (as provided by the fluoroscopic device): 39 mGy Kerma COMPARISON:  MR 03/08/2022 FINDINGS: Limited images during ERCP. Initial image demonstrates endoscope projecting over the upper abdomen. There is subsequently placement of a safety wire and retrograde infusion of contrast. Deployment of a retrieval balloon. IMPRESSION: Limited images during ERCP demonstrates deployment of retrieval balloon within the common bile duct. Please refer to the dictated operative report for full details of intraoperative findings and procedure. Electronically Signed   By: Corrie Mckusick D.O.   On: 03/12/2022 13:08    Anti-infectives: Anti-infectives (From admission, onward)    Start     Dose/Rate Route Frequency Ordered Stop   03/12/22 1300  ciprofloxacin (CIPRO) IVPB 400 mg  Status:  Discontinued        400 mg 200 mL/hr over 60 Minutes Intravenous  Once 03/12/22 1254 03/12/22 1320   03/07/22 0400  cefTRIAXone (ROCEPHIN) 1 g in sodium chloride 0.9 % 100 mL IVPB        1 g 200 mL/hr over  30 Minutes Intravenous Every 24 hours 03/06/22 2104 03/09/22 0346   03/06/22 2200  ceFEPIme (MAXIPIME) 2 g in sodium chloride 0.9 % 100 mL IVPB  Status:  Discontinued        2 g 200 mL/hr over 30 Minutes Intravenous Every 8 hours 03/06/22 1817 03/06/22 1822   03/06/22 2200  ceFEPIme (MAXIPIME) 2 g in sodium chloride 0.9 % 100 mL IVPB  Status:  Discontinued        2 g 200 mL/hr over 30 Minutes Intravenous Every 12 hours 03/06/22 1822 03/06/22 2104   03/06/22 1645  ceFEPIme (MAXIPIME) 2 g in sodium chloride 0.9 % 100 mL IVPB  Status:  Discontinued       See Hyperspace for full Linked Orders Report.   2 g 200 mL/hr over 30 Minutes Intravenous  Once  03/06/22 1640 03/06/22 1642   03/06/22 1645  metroNIDAZOLE (FLAGYL) IVPB 500 mg  Status:  Discontinued       See Hyperspace for full Linked Orders Report.   500 mg 100 mL/hr over 60 Minutes Intravenous  Once 03/06/22 1640 03/06/22 1642   03/06/22 1645  ceFEPIme (MAXIPIME) 2 g in sodium chloride 0.9 % 100 mL IVPB        2 g 200 mL/hr over 30 Minutes Intravenous  Once 03/06/22 1642 03/06/22 1741   03/06/22 1645  metroNIDAZOLE (FLAGYL) IVPB 500 mg        500 mg 100 mL/hr over 60 Minutes Intravenous  Once 03/06/22 1642 03/06/22 1933        Assessment/Plan Gallstone pancreatitis Choledocholithiasis - ERCP on 10/20 with stone extraction -question history of cirrhosis but CT/MRI/labs are all normal suggesting no evidence of cirrhosis.  GI suggested may have a history of ETOH hepatitis -no OR availability today -may have a diet, will discuss timing of surgery with MD and OR.  Could be delayed til Monday, unclear at this time.  ID - none currently VTE - SCDs, lovenox FEN - IVF, soft diet Foley - none    Memory impairment, prior diagnosis of Warnicke encephalopathy    I reviewed Consultant gastroenterology notes, hospitalist notes, last 24 h vitals and pain scores, last 48 h intake and output, last 24 h labs and trends, and last 24 h imaging results.     LOS: 7 days    Henreitta Cea, [facility] 03/13/2022, 8:39 AM Please see Amion for pager number during day hours 7:00am-4:30pm

## 2022-03-14 ENCOUNTER — Inpatient Hospital Stay (HOSPITAL_COMMUNITY): Payer: Medicare Other | Admitting: Anesthesiology

## 2022-03-14 ENCOUNTER — Other Ambulatory Visit: Payer: Self-pay

## 2022-03-14 ENCOUNTER — Encounter (HOSPITAL_COMMUNITY)
Admission: EM | Disposition: A | Discharge status: Home Health | Payer: Self-pay | Source: Home / Self Care | Attending: Internal Medicine

## 2022-03-14 ENCOUNTER — Encounter (HOSPITAL_COMMUNITY): Payer: Self-pay | Admitting: Internal Medicine

## 2022-03-14 DIAGNOSIS — R933 Abnormal findings on diagnostic imaging of other parts of digestive tract: Secondary | ICD-10-CM | POA: Diagnosis not present

## 2022-03-14 DIAGNOSIS — K8050 Calculus of bile duct without cholangitis or cholecystitis without obstruction: Secondary | ICD-10-CM | POA: Diagnosis not present

## 2022-03-14 DIAGNOSIS — D649 Anemia, unspecified: Secondary | ICD-10-CM

## 2022-03-14 DIAGNOSIS — Z87891 Personal history of nicotine dependence: Secondary | ICD-10-CM

## 2022-03-14 DIAGNOSIS — K8051 Calculus of bile duct without cholangitis or cholecystitis with obstruction: Secondary | ICD-10-CM

## 2022-03-14 DIAGNOSIS — K851 Biliary acute pancreatitis without necrosis or infection: Secondary | ICD-10-CM | POA: Diagnosis not present

## 2022-03-14 DIAGNOSIS — I10 Essential (primary) hypertension: Secondary | ICD-10-CM

## 2022-03-14 HISTORY — PX: CHOLECYSTECTOMY: SHX55

## 2022-03-14 SURGERY — LAPAROSCOPIC CHOLECYSTECTOMY
Anesthesia: General | Site: Abdomen

## 2022-03-14 MED ORDER — ONDANSETRON HCL 4 MG/2ML IJ SOLN
INTRAMUSCULAR | Status: DC | PRN
  Administered 2022-03-14: 4 mg via INTRAVENOUS

## 2022-03-14 MED ORDER — BUPIVACAINE-EPINEPHRINE 0.25% -1:200000 IJ SOLN
INTRAMUSCULAR | Status: DC | PRN
  Administered 2022-03-14: 20 mL

## 2022-03-14 MED ORDER — ROCURONIUM BROMIDE 10 MG/ML (PF) SYRINGE
PREFILLED_SYRINGE | INTRAVENOUS | Status: AC
  Filled 2022-03-14: qty 10

## 2022-03-14 MED ORDER — LIDOCAINE 2% (20 MG/ML) 5 ML SYRINGE
INTRAMUSCULAR | Status: AC
  Filled 2022-03-14: qty 5

## 2022-03-14 MED ORDER — PHENYLEPHRINE 80 MCG/ML (10ML) SYRINGE FOR IV PUSH (FOR BLOOD PRESSURE SUPPORT)
PREFILLED_SYRINGE | INTRAVENOUS | Status: AC
  Filled 2022-03-14: qty 10

## 2022-03-14 MED ORDER — VANCOMYCIN HCL IN DEXTROSE 1-5 GM/200ML-% IV SOLN
1000.0000 mg | Freq: Once | INTRAVENOUS | Status: AC
  Administered 2022-03-14: 1000 mg via INTRAVENOUS
  Filled 2022-03-14: qty 200

## 2022-03-14 MED ORDER — DEXAMETHASONE SODIUM PHOSPHATE 10 MG/ML IJ SOLN
INTRAMUSCULAR | Status: AC
  Filled 2022-03-14: qty 1

## 2022-03-14 MED ORDER — MORPHINE SULFATE (PF) 4 MG/ML IV SOLN
4.0000 mg | INTRAVENOUS | Status: DC | PRN
  Administered 2022-03-14 – 2022-03-15 (×2): 4 mg via INTRAVENOUS
  Filled 2022-03-14 (×3): qty 1

## 2022-03-14 MED ORDER — HEMOSTATIC AGENTS (NO CHARGE) OPTIME
TOPICAL | Status: DC | PRN
  Administered 2022-03-14: 1 via TOPICAL

## 2022-03-14 MED ORDER — PHENYLEPHRINE 80 MCG/ML (10ML) SYRINGE FOR IV PUSH (FOR BLOOD PRESSURE SUPPORT)
PREFILLED_SYRINGE | INTRAVENOUS | Status: DC | PRN
  Administered 2022-03-14 (×3): 80 ug via INTRAVENOUS

## 2022-03-14 MED ORDER — BUPIVACAINE-EPINEPHRINE (PF) 0.25% -1:200000 IJ SOLN
INTRAMUSCULAR | Status: AC
  Filled 2022-03-14: qty 30

## 2022-03-14 MED ORDER — PROPOFOL 10 MG/ML IV BOLUS
INTRAVENOUS | Status: AC
  Filled 2022-03-14: qty 20

## 2022-03-14 MED ORDER — ONDANSETRON HCL 4 MG/2ML IJ SOLN
INTRAMUSCULAR | Status: AC
  Filled 2022-03-14: qty 2

## 2022-03-14 MED ORDER — SODIUM CHLORIDE 0.9 % IR SOLN
Status: DC | PRN
  Administered 2022-03-14: 1000 mL

## 2022-03-14 MED ORDER — OXYCODONE HCL 5 MG PO TABS
5.0000 mg | ORAL_TABLET | ORAL | Status: DC | PRN
  Administered 2022-03-14 – 2022-03-16 (×3): 5 mg via ORAL
  Filled 2022-03-14 (×4): qty 1

## 2022-03-14 MED ORDER — CHLORHEXIDINE GLUCONATE 0.12 % MT SOLN
15.0000 mL | Freq: Once | OROMUCOSAL | Status: AC
  Administered 2022-03-14: 15 mL via OROMUCOSAL
  Filled 2022-03-14: qty 15

## 2022-03-14 MED ORDER — LACTATED RINGERS IV SOLN: INTRAVENOUS | Status: DC

## 2022-03-14 MED ORDER — FENTANYL CITRATE (PF) 250 MCG/5ML IJ SOLN
INTRAMUSCULAR | Status: AC
  Filled 2022-03-14: qty 5

## 2022-03-14 MED ORDER — LIDOCAINE 2% (20 MG/ML) 5 ML SYRINGE
INTRAMUSCULAR | Status: DC | PRN
  Administered 2022-03-14: 60 mg via INTRAVENOUS

## 2022-03-14 MED ORDER — 0.9 % SODIUM CHLORIDE (POUR BTL) OPTIME
TOPICAL | Status: DC | PRN
  Administered 2022-03-14: 1000 mL

## 2022-03-14 MED ORDER — PROPOFOL 10 MG/ML IV BOLUS
INTRAVENOUS | Status: DC | PRN
  Administered 2022-03-14: 100 mg via INTRAVENOUS

## 2022-03-14 MED ORDER — FENTANYL CITRATE (PF) 100 MCG/2ML IJ SOLN
INTRAMUSCULAR | Status: AC
  Filled 2022-03-14: qty 2

## 2022-03-14 MED ORDER — FENTANYL CITRATE (PF) 100 MCG/2ML IJ SOLN
25.0000 ug | INTRAMUSCULAR | Status: DC | PRN
  Administered 2022-03-14: 25 ug via INTRAVENOUS

## 2022-03-14 MED ORDER — ROCURONIUM BROMIDE 10 MG/ML (PF) SYRINGE
PREFILLED_SYRINGE | INTRAVENOUS | Status: DC | PRN
  Administered 2022-03-14: 40 mg via INTRAVENOUS

## 2022-03-14 MED ORDER — FENTANYL CITRATE (PF) 250 MCG/5ML IJ SOLN
INTRAMUSCULAR | Status: DC | PRN
  Administered 2022-03-14 (×3): 50 ug via INTRAVENOUS

## 2022-03-14 MED ORDER — ORAL CARE MOUTH RINSE: 15.0000 mL | Freq: Once | OROMUCOSAL | Status: AC

## 2022-03-14 MED ORDER — SUGAMMADEX SODIUM 200 MG/2ML IV SOLN
INTRAVENOUS | Status: DC | PRN
  Administered 2022-03-14: 200 mg via INTRAVENOUS

## 2022-03-14 MED ORDER — AMISULPRIDE (ANTIEMETIC) 5 MG/2ML IV SOLN: 10.0000 mg | Freq: Once | INTRAVENOUS | Status: DC | PRN

## 2022-03-14 SURGICAL SUPPLY — 41 items
APPLIER CLIP 5 13 M/L LIGAMAX5 (MISCELLANEOUS) ×1
BAG COUNTER SPONGE SURGICOUNT (BAG) ×1 IMPLANT
BLADE CLIPPER SURG (BLADE) IMPLANT
CANISTER SUCT 3000ML PPV (MISCELLANEOUS) ×1 IMPLANT
CHLORAPREP W/TINT 26 (MISCELLANEOUS) ×1 IMPLANT
CLIP APPLIE 5 13 M/L LIGAMAX5 (MISCELLANEOUS) ×1 IMPLANT
COVER SURGICAL LIGHT HANDLE (MISCELLANEOUS) ×1 IMPLANT
DERMABOND ADVANCED .7 DNX12 (GAUZE/BANDAGES/DRESSINGS) ×1 IMPLANT
ELECT REM PT RETURN 9FT ADLT (ELECTROSURGICAL) ×1
ELECTRODE REM PT RTRN 9FT ADLT (ELECTROSURGICAL) ×1 IMPLANT
GLOVE BIO SURGEON STRL SZ8 (GLOVE) ×1 IMPLANT
GLOVE BIOGEL PI IND STRL 8 (GLOVE) ×1 IMPLANT
GOWN STRL REUS W/ TWL LRG LVL3 (GOWN DISPOSABLE) ×2 IMPLANT
GOWN STRL REUS W/ TWL XL LVL3 (GOWN DISPOSABLE) ×1 IMPLANT
GOWN STRL REUS W/TWL LRG LVL3 (GOWN DISPOSABLE) ×2
GOWN STRL REUS W/TWL XL LVL3 (GOWN DISPOSABLE) ×1
HEMOSTAT SNOW SURGICEL 2X4 (HEMOSTASIS) IMPLANT
KIT BASIN OR (CUSTOM PROCEDURE TRAY) ×1 IMPLANT
KIT TURNOVER KIT B (KITS) ×1 IMPLANT
L-HOOK LAP DISP 36CM (ELECTROSURGICAL) ×1
LHOOK LAP DISP 36CM (ELECTROSURGICAL) ×1 IMPLANT
NDL 22X1.5 STRL (OR ONLY) (MISCELLANEOUS) ×1 IMPLANT
NEEDLE 22X1.5 STRL (OR ONLY) (MISCELLANEOUS) ×1 IMPLANT
NS IRRIG 1000ML POUR BTL (IV SOLUTION) ×1 IMPLANT
PAD ARMBOARD 7.5X6 YLW CONV (MISCELLANEOUS) ×1 IMPLANT
PENCIL BUTTON HOLSTER BLD 10FT (ELECTRODE) ×1 IMPLANT
POUCH RETRIEVAL ECOSAC 10 (ENDOMECHANICALS) ×1 IMPLANT
POUCH RETRIEVAL ECOSAC 10MM (ENDOMECHANICALS) ×1
SCISSORS LAP 5X35 DISP (ENDOMECHANICALS) ×1 IMPLANT
SET IRRIG TUBING LAPAROSCOPIC (IRRIGATION / IRRIGATOR) ×1 IMPLANT
SET TUBE SMOKE EVAC HIGH FLOW (TUBING) ×1 IMPLANT
SLEEVE ENDOPATH XCEL 5M (ENDOMECHANICALS) ×2 IMPLANT
SPECIMEN JAR SMALL (MISCELLANEOUS) ×1 IMPLANT
SUT VIC AB 4-0 PS2 27 (SUTURE) ×1 IMPLANT
TOWEL GREEN STERILE (TOWEL DISPOSABLE) ×1 IMPLANT
TOWEL GREEN STERILE FF (TOWEL DISPOSABLE) ×1 IMPLANT
TRAY LAPAROSCOPIC MC (CUSTOM PROCEDURE TRAY) ×1 IMPLANT
TROCAR XCEL BLUNT TIP 100MML (ENDOMECHANICALS) ×1 IMPLANT
TROCAR Z-THREAD OPTICAL 5X100M (TROCAR) ×1 IMPLANT
WARMER LAPAROSCOPE (MISCELLANEOUS) ×1 IMPLANT
WATER STERILE IRR 1000ML POUR (IV SOLUTION) ×1 IMPLANT

## 2022-03-14 NOTE — Op Note (Signed)
Laparoscopic Cholecystectomy with IOC Procedure Note  Indications: This patient presents with symptomatic gallbladder disease and will undergo laparoscopic cholecystectomy.  Pre-operative Diagnosis: choledocholithiasis  Post-operative Diagnosis: choledocholithiasis  Surgeon: Zenovia Jarred   Assistants: none  Anesthesia: General endotracheal anesthesia   Procedure Details  The patient was seen again in the Holding Room. The risks, benefits, complications, treatment options, and expected outcomes were discussed with the patient. The possibilities of reaction to medication, pulmonary aspiration, perforation of viscus, bleeding, recurrent infection, finding a normal gallbladder, the need for additional procedures, failure to diagnose a condition, the possible need to convert to an open procedure, and creating a complication requiring transfusion or operation were discussed with the patient. The likelihood of improving the patient's symptoms with return to their baseline status is good.  The patient and/or family concurred with the proposed plan, giving informed consent. The site of surgery properly noted. The patient was taken to Operating Room, identified as [name] and the procedure verified as Laparoscopic Cholecystectomy with Intraoperative Cholangiogram. A Time Out was held and the above information confirmed.  Prior to the induction of general anesthesia, antibiotic prophylaxis was administered. General endotracheal anesthesia was then administered and tolerated well. After the induction, the abdomen was prepped with Chloraprep and draped in the sterile fashion. The patient was positioned in the supine position.  Local anesthetic agent was injected into the skin near the umbilicus and an incision made. We dissected down to the abdominal fascia with blunt dissection.  The fascia was incised vertically and we entered the peritoneal cavity bluntly.  A pursestring suture of 0-Vicryl  was placed around the fascial opening.  The Hasson cannula was inserted and secured with the stay suture.  Pneumoperitoneum was then created with CO2 and tolerated well without any adverse changes in the patient's vital signs. A 64mm port was placed in the subxiphoid position.  Two 5-mm ports were placed in the right upper quadrant. All skin incisions were infiltrated with a local anesthetic agent before making the incision and placing the trocars.   We positioned the patient in reverse Trendelenburg, tilted slightly to the patient's left.  The gallbladder was identified, the fundus grasped and retracted cephalad. Adhesions were lysed bluntly and with the electrocautery where indicated, taking care not to injure any adjacent organs or viscus. The infundibulum was grasped and retracted laterally, exposing the peritoneum overlying the triangle of Calot. This was then divided and exposed in a blunt fashion. A critical view of the cystic duct and cystic artery was obtained.  The cystic duct was clearly identified and bluntly dissected circumferentially. A clip was placed at the infundibulocystic duct junction.   An incision was made in the cystic duct and the [facility] cholangiogram catheter introduced. The catheter was secured using a clip. A cholangiogram was then obtained which showed good visualization of the distal and proximal biliary tree with no sign of filling defects or obstruction.  Contrast flowed easily into the duodenum. The catheter was then removed.   The cystic duct was then ligated with clips and divided. The cystic artery was identified, dissected free, ligated with clips and divided as well.   The gallbladder was dissected from the liver bed in retrograde fashion with the electrocautery. The gallbladder was removed and placed in an Endocatch sac. The liver bed was irrigated and inspected. Hemostasis was achieved with the electrocautery. Copious irrigation was utilized and was repeatedly aspirated  until clear.  The gallbladder and Endocatch sac were then removed through the  umbilical port site.  The pursestring suture was used to close the umbilical fascia.    We again inspected the right upper quadrant for hemostasis. Surgicvel snow was placed and achieved this. Pneumoperitoneum was released as we removed the trocars.  4-0 Vicryl was used to close the skin.  Dermabond was applied.  The patient was then extubated and brought to the recovery room in stable condition. Instrument, sponge, and needle counts were correct at closure and at the conclusion of the case.   Findings: Cholecystitis with Cholelithiasis  Estimated Blood Loss: less than 50 mL         Drains: no         Specimens: Gallbladder           Complications: None; patient tolerated the procedure well.         Disposition: PACU - hemodynamically stable.         Condition: stable  Violeta Gelinas, MD, MPH, FACS Pager: [phone]

## 2022-03-14 NOTE — Progress Notes (Signed)
Patient ID: Maria Dunlap, female   DOB: [birth year], 71 y.o.   MRN: [id number] 2 Days Post-Op    Subjective: No abd pain ROS negative except as listed above. Objective: Vital signs in last 24 hours: Temp:  [98.1 F (36.7 C)-98.6 F (37 C)] 98.6 F (37 C) (10/22 0409) Pulse Rate:  [51-80] 51 (10/22 0409) Resp:  [16-18] 18 (10/22 0409) BP: (100-127)/(69-74) 114/71 (10/22 0409) SpO2:  [99 %-100 %] 99 % (10/22 0409) Last BM Date : 03/10/22  Intake/Output from previous day: 10/21 0701 - 10/22 0700 In: 720 [P.O.:720] Out: -  Intake/Output this shift: No intake/output data recorded.  General appearance: alert and cooperative Resp: clear to auscultation bilaterally GI: soft, NT  Lab Results: CBC  Recent Labs    03/12/22 0323  WBC 4.4  HGB 7.3*  HCT 22.0*  PLT 182   BMET Recent Labs    03/12/22 0323 03/13/22 0420  NA 139 137  K 3.4* 4.6  CL 106 108  CO2 24 22  GLUCOSE 89 109*  BUN <5* <5*  CREATININE 0.69 0.85  CALCIUM 8.2* 8.1*   PT/INR No results for input(s): "LABPROT", "INR" in the last 72 hours. ABG No results for input(s): "PHART", "HCO3" in the last 72 hours.  Invalid input(s): "PCO2", "PO2"  Studies/Results: DG ERCP  Result Date: 03/12/2022 CLINICAL DATA:  71 year old female with cholelithiasis EXAM: ERCP TECHNIQUE: Multiple spot images obtained with the fluoroscopic device and submitted for interpretation post-procedure. FLUOROSCOPY: Radiation Exposure Index (as provided by the fluoroscopic device): 39 mGy Kerma COMPARISON:  MR 03/08/2022 FINDINGS: Limited images during ERCP. Initial image demonstrates endoscope projecting over the upper abdomen. There is subsequently placement of a safety wire and retrograde infusion of contrast. Deployment of a retrieval balloon. IMPRESSION: Limited images during ERCP demonstrates deployment of retrieval balloon within the common bile duct. Please refer to the dictated operative report for full details of  intraoperative findings and procedure. Electronically Signed   By: Corrie Mckusick D.O.   On: 03/12/2022 13:08    Anti-infectives: Anti-infectives (From admission, onward)    Start     Dose/Rate Route Frequency Ordered Stop   03/12/22 1300  ciprofloxacin (CIPRO) IVPB 400 mg  Status:  Discontinued        400 mg 200 mL/hr over 60 Minutes Intravenous  Once 03/12/22 1254 03/12/22 1320   03/07/22 0400  cefTRIAXone (ROCEPHIN) 1 g in sodium chloride 0.9 % 100 mL IVPB        1 g 200 mL/hr over 30 Minutes Intravenous Every 24 hours 03/06/22 2104 03/09/22 0346   03/06/22 2200  ceFEPIme (MAXIPIME) 2 g in sodium chloride 0.9 % 100 mL IVPB  Status:  Discontinued        2 g 200 mL/hr over 30 Minutes Intravenous Every 8 hours 03/06/22 1817 03/06/22 1822   03/06/22 2200  ceFEPIme (MAXIPIME) 2 g in sodium chloride 0.9 % 100 mL IVPB  Status:  Discontinued        2 g 200 mL/hr over 30 Minutes Intravenous Every 12 hours 03/06/22 1822 03/06/22 2104   03/06/22 1645  ceFEPIme (MAXIPIME) 2 g in sodium chloride 0.9 % 100 mL IVPB  Status:  Discontinued       See Hyperspace for full Linked Orders Report.   2 g 200 mL/hr over 30 Minutes Intravenous  Once 03/06/22 1640 03/06/22 1642   03/06/22 1645  metroNIDAZOLE (FLAGYL) IVPB 500 mg  Status:  Discontinued       See Hyperspace  for full Linked Orders Report.   500 mg 100 mL/hr over 60 Minutes Intravenous  Once 03/06/22 1640 03/06/22 1642   03/06/22 1645  ceFEPIme (MAXIPIME) 2 g in sodium chloride 0.9 % 100 mL IVPB        2 g 200 mL/hr over 30 Minutes Intravenous  Once 03/06/22 1642 03/06/22 1741   03/06/22 1645  metroNIDAZOLE (FLAGYL) IVPB 500 mg        500 mg 100 mL/hr over 60 Minutes Intravenous  Once 03/06/22 1642 03/06/22 1933       Assessment/Plan: Gallstone pancreatitis Choledocholithiasis - ERCP on 10/20 with stone extraction -question history of cirrhosis but CT/MRI/labs are all normal suggesting no evidence of cirrhosis.  GI suggested may have a  history of ETOH hepatitis -for laparoscopic cholecystectomy this AM. Procedure, risks, and benefits D/W her and her husband. They agree.  ID - none currently VTE - SCDs, lovenox FEN - IVF, soft diet Foley - none    Memory impairment, prior diagnosis of Warnicke encephalopathy    LOS: 8 days    Georganna Skeans, MD, MPH, FACS Trauma & General Surgery Use [email or web address] to contact on call provider  03/14/2022

## 2022-03-14 NOTE — Anesthesia Preprocedure Evaluation (Signed)
Anesthesia Evaluation  Patient identified by MRN, date of birth, ID band Patient confused    Reviewed: Allergy & Precautions, NPO status , Patient's Chart, lab work & pertinent test results  History of Anesthesia Complications Negative for: history of anesthetic complications  Airway Mallampati: III  TM Distance: >3 FB Neck ROM: Full    Dental  (+) Dental Advisory Given, Teeth Intact   Pulmonary neg shortness of breath, neg sleep apnea, neg COPD, neg recent URI, former smoker,    breath sounds clear to auscultation       Cardiovascular hypertension, (-) angina(-) Past MI and (-) CHF  Rhythm:Regular     Neuro/Psych negative neurological ROS     GI/Hepatic GERD  ,(+) Cirrhosis     substance abuse  alcohol use, Lab Results      Component                Value               Date                      ALT                      16                  03/12/2022                AST                      27                  03/12/2022                ALKPHOS                  82                  03/12/2022                BILITOT                  0.6                 03/12/2022              Endo/Other  negative endocrine ROS  Renal/GU negative Renal ROS     Musculoskeletal   Abdominal   Peds  Hematology  (+) Blood dyscrasia, anemia , Lab Results      Component                Value               Date                      WBC                      4.4                 03/12/2022                HGB                      7.3 (L)             03/12/2022  HCT                      22.0 (L)            03/12/2022                MCV                      94.4                03/12/2022                PLT                      182                 03/12/2022              Anesthesia Other Findings   Reproductive/Obstetrics                             Anesthesia Physical  Anesthesia Plan  ASA:  3  Anesthesia Plan: General   Post-op Pain Management: Toradol IV (intra-op)*   Induction: Intravenous  PONV Risk Score and Plan: 3 and Ondansetron and Dexamethasone  Airway Management Planned: Oral ETT  Additional Equipment: None  Intra-op Plan:   Post-operative Plan: Extubation in OR  Informed Consent: I have reviewed the patients History and Physical, chart, labs and discussed the procedure including the risks, benefits and alternatives for the proposed anesthesia with the patient or authorized representative who has indicated his/her understanding and acceptance.     Dental advisory given and Consent reviewed with POA  Plan Discussed with: CRNA  Anesthesia Plan Comments:        Anesthesia Quick Evaluation

## 2022-03-14 NOTE — Anesthesia Procedure Notes (Signed)
Procedure Name: Intubation Date/Time: 03/14/2022 9:11 AM  Performed by: Alain Marion, CRNAPre-anesthesia Checklist: Patient identified, Emergency Drugs available, Suction available and Patient being monitored Patient Re-evaluated:Patient Re-evaluated prior to induction Oxygen Delivery Method: Circle System Utilized Preoxygenation: Pre-oxygenation with 100% oxygen Induction Type: IV induction Ventilation: Mask ventilation without difficulty and Oral airway inserted - appropriate to patient size Laryngoscope Size: Sabra Heck and 2 Grade View: Grade I Tube type: Oral Tube size: 6.5 mm Number of attempts: 1 Airway Equipment and Method: Stylet and Oral airway Placement Confirmation: ETT inserted through vocal cords under direct vision, positive ETCO2 and breath sounds checked- equal and bilateral Secured at: 21 cm Tube secured with: Tape Dental Injury: Teeth and Oropharynx as per pre-operative assessment

## 2022-03-14 NOTE — Anesthesia Postprocedure Evaluation (Signed)
Anesthesia Post Note  Patient: Maria Dunlap  Procedure(s) Performed: LAPAROSCOPIC CHOLECYSTECTOMY (Abdomen)     Patient location during evaluation: PACU Anesthesia Type: General Level of consciousness: awake and alert Pain management: pain level controlled Vital Signs Assessment: post-procedure vital signs reviewed and stable Respiratory status: spontaneous breathing, nonlabored ventilation, respiratory function stable and patient connected to nasal cannula oxygen Cardiovascular status: blood pressure returned to baseline and stable Postop Assessment: no apparent nausea or vomiting Anesthetic complications: no   No notable events documented.  Last Vitals:  Vitals:   03/14/22 1045 03/14/22 1100  BP: 123/73 126/70  Pulse: (!) 54 (!) 57  Resp: 13 14  Temp: 36.7 C 36.7 C  SpO2: 95% 99%    Last Pain:  Vitals:   03/14/22 1100  TempSrc: Oral  PainSc:                  [name]

## 2022-03-14 NOTE — Discharge Instructions (Signed)
[facility]  Please arrive at least 30 min before your appointment to complete your check in paperwork.  If you are unable to arrive 30 min prior to your appointment time we may have to cancel or reschedule you. LAPAROSCOPIC SURGERY: POST OP INSTRUCTIONS Always review your discharge instruction sheet given to you by the facility where your surgery was performed. IF YOU HAVE DISABILITY OR FAMILY LEAVE FORMS, YOU MUST BRING THEM TO THE OFFICE FOR PROCESSING.   DO NOT GIVE THEM TO YOUR DOCTOR.  PAIN CONTROL  First take acetaminophen (Tylenol) AND/or ibuprofen (Advil) to control your pain after surgery.  Follow directions on package.  Taking acetaminophen (Tylenol) and/or ibuprofen (Advil) regularly after surgery will help to control your pain and lower the amount of prescription pain medication you may need.  You should not take more than 4,000 mg (4 grams) of acetaminophen (Tylenol) in 24 hours.  You should not take ibuprofen (Advil), aleve, motrin, naprosyn or other NSAIDS if you have a history of stomach ulcers or chronic kidney disease.  A prescription for pain medication may be given to you upon discharge.  Take your pain medication as prescribed, if you still have uncontrolled pain after taking acetaminophen (Tylenol) or ibuprofen (Advil). Use ice packs to help control pain. If you need a refill on your pain medication, please contact your pharmacy.  They will contact our office to request authorization. Prescriptions will not be filled after 5pm or on week-ends.  HOME MEDICATIONS Take your usually prescribed medications unless otherwise directed.  DIET You should follow a light diet the first few days after arrival home.  Be sure to include lots of fluids daily. Avoid fatty, fried foods.   CONSTIPATION It is common to experience some constipation after surgery and if you are taking pain medication.  Increasing fluid intake and taking a stool softener (such as Colace)  will usually help or prevent this problem from occurring.  A mild laxative (Milk of Magnesia or Miralax) should be taken according to package instructions if there are no bowel movements after 48 hours.  WOUND/INCISION CARE Most patients will experience some swelling and bruising in the area of the incisions.  Ice packs will help.  Swelling and bruising can take several days to resolve.  Unless discharge instructions indicate otherwise, follow guidelines below  STERI-STRIPS - you may remove your outer bandages 48 hours after surgery, and you may shower at that time.  You have steri-strips (small skin tapes) in place directly over the incision.  These strips should be left on the skin for 7-10 days.   DERMABOND/SKIN GLUE - you may shower in 24 hours.  The glue will flake off over the next 2-3 weeks. Any sutures or staples will be removed at the office during your follow-up visit.  ACTIVITIES You may resume regular (light) daily activities beginning the next day--such as daily self-care, walking, climbing stairs--gradually increasing activities as tolerated.  You may have sexual intercourse when it is comfortable.  Refrain from any heavy lifting or straining until approved by your doctor. You may drive when you are no longer taking prescription pain medication, you can comfortably wear a seatbelt, and you can safely maneuver your car and apply brakes.  FOLLOW-UP You should see your doctor in the office for a follow-up appointment approximately 2-3 weeks after your surgery.  You should have been given your post-op/follow-up appointment when your surgery was scheduled.  If you did not receive a post-op/follow-up appointment, make sure   that you call for this appointment within a day or two after you arrive home to insure a convenient appointment time.   WHEN TO CALL YOUR DOCTOR: Fever over 101.0 Inability to urinate Continued bleeding from incision. Increased pain, redness, or drainage from the  incision. Increasing abdominal pain  The clinic staff is available to answer your questions during regular business hours.  Please don't hesitate to call and ask to speak to one of the nurses for clinical concerns.  If you have a medical emergency, go to the nearest emergency room or call 911.  A surgeon from [facility] is always on call at the hospital. [address], [address], [place], [place]  [address] ? [address] [address], [place], [place]   [address] [phone] ? [phone] ? FAX [phone]  

## 2022-03-14 NOTE — Transfer of Care (Signed)
Immediate Anesthesia Transfer of Care Note  Patient: SHERIDAN HEW  Procedure(s) Performed: LAPAROSCOPIC CHOLECYSTECTOMY (Abdomen)  Patient Location: PACU  Anesthesia Type:General  Level of Consciousness: awake and alert   Airway & Oxygen Therapy: Patient Spontanous Breathing and Patient connected to face mask oxygen  Post-op Assessment: Report given to RN and Post -op Vital signs reviewed and stable  Post vital signs: Reviewed and stable  Last Vitals:  Vitals Value Taken Time  BP 142/78 03/14/22 1000  Temp 36.3 C 03/14/22 1000  Pulse 67 03/14/22 1009  Resp 17 03/14/22 1009  SpO2 94 % 03/14/22 1009  Vitals shown include unvalidated device data.  Last Pain:  Vitals:   03/14/22 1000  TempSrc:   PainSc: Asleep         Complications: No notable events documented.

## 2022-03-14 NOTE — Progress Notes (Signed)
[facility] PROGRESS NOTE    Progress Note  Maria Dunlap  WNU:[id number] DOB: 05-22-1951 DOA: 03/06/2022 PCP: Sonny Masters, FNP     Brief Narrative:   Maria Dunlap is an 71 y.o. female past medical history significant for alcohol abuse, comes into the clinic with abdominal pain and vomiting along with multiple experience.  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  Gallstone pancreatitis GI was consulted who recommended transfer to [facility] possible ERCP, MRCP done that showed common bile duct with a 14 mm stone.    Assessment/Plan:   Acute gallstone pancreatitis CT findings suggestive of acute pancreatitis MRCP confirmed CBD stone of 14 mm. General surgery GI was consulted recommended transfer to [facility]. 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 General surgery perform laparoscopy cholecystectomy on 03/14/2022. Blood pressure and heart rate are stable.  Elevated lactic acid: Borderline elevated pancreatitis she is not septic.  Lower UTI: She reported dysuria, she reported he was on a course of antibiotics about 2-1/2 weeks ago. Was started on Rocephin for 3 days. Urine cultures were sent which are negative.  Prolonged QTc: Try the potassium greater than 4 magnesium greater than 2.  Normocytic anemia: Hemoglobin has remained relatively stable at 7.5.  Hypokalemia: Has improved, after oral repletion.    DVT prophylaxis: lovenox Family Communication:daughter Status is: Inpatient Remains inpatient appropriate because: Acute gallstone hepatitis.    Code Status:     Code Status Orders  (From admission, onward)           Start     Ordered   03/06/22 2102  Full code  Continuous        03/06/22 2104            Code Status History     Date Active Date Inactive Code Status Order ID Comments User Context   01/22/2022 2029 01/29/2022 1808 Full Code [id number]  Elgergawy, Leana Roe, MD Inpatient         IV Access:   Peripheral IV   Procedures and diagnostic studies:   DG ERCP  Result Date: 03-15-2022 CLINICAL DATA:  71 year old female with cholelithiasis EXAM: ERCP TECHNIQUE: Multiple spot images obtained with the fluoroscopic device and submitted for interpretation post-procedure. FLUOROSCOPY: Radiation Exposure Index (as provided by the fluoroscopic device): 39 mGy Kerma COMPARISON:  MR 03/08/2022 FINDINGS: Limited images during ERCP. Initial image demonstrates endoscope projecting over the upper abdomen. There is subsequently placement of a safety wire and retrograde infusion of contrast. Deployment of a retrieval balloon. IMPRESSION: Limited images during ERCP demonstrates deployment of retrieval balloon within the common bile duct. Please refer to the dictated operative report for full details of intraoperative findings and procedure. Electronically Signed   By: Gilmer Mor D.O.   On: 15-Mar-2022 13:08     Medical Consultants:   None.   Subjective:    Maria Dunlap no complaints sleepy this morning.  Objective:    Vitals:   03/14/22 1000 03/14/22 1015 03/14/22 1030 03/14/22 1045  BP: (!) 142/78 133/73 116/81 123/73  Pulse: 88 61 66 (!) 54  Resp: 16 14 12 13   Temp: (!) 97.4 F (36.3 C)   98 F (36.7 C)  TempSrc:      SpO2: 98% 94% 97% 95%  Weight:  Height:       SpO2: 95 %   Intake/Output Summary (Last 24 hours) at 03/14/2022 1050 Last data filed at 03/14/2022 0948 Gross per 24 hour  Intake 1080 ml  Output 5 ml  Net 1075 ml    Filed Weights   03/06/22 1821 03/14/22 0836  Weight: 51.5 kg 51.5 kg    Exam: General exam: In no acute distress. Respiratory system: Good air movement and clear to auscultation. Cardiovascular system: S1 & S2  heard, RRR. No JVD. Gastrointestinal system: Abdomen is nondistended, soft and nontender.  Extremities: No pedal edema. Skin: No rashes, lesions or ulcers Psychiatry: Judgement and insight appear normal. Mood & affect appropriate. Data Reviewed:    Labs: Basic Metabolic Panel: Recent Labs  Lab 03/09/22 0350 03/10/22 0433 03/11/22 0327 03/12/22 0323 03/13/22 0420  NA 137 138 139 139 137  K 3.1* 3.3* 4.0 3.4* 4.6  CL 108 107 111 106 108  CO2 24 24 22 24 22   GLUCOSE 75 86 79 89 109*  BUN 6* <5* <5* <5* <5*  CREATININE 0.50 0.61 0.68 0.69 0.85  CALCIUM 8.0* 8.1* 8.3* 8.2* 8.1*    GFR Estimated Creatinine Clearance: 44.5 mL/min (by C-G formula based on SCr of 0.85 mg/dL). Liver Function Tests: Recent Labs  Lab 03/08/22 0440 03/09/22 0350 03/10/22 0433 03/11/22 0327 03/12/22 0323  AST 34 30 28 29 27   ALT 19 18 16 16 16   ALKPHOS 107 106 96 90 82  BILITOT 1.2 0.9 0.7 1.0 0.6  PROT 4.0* 4.2* 3.9* 4.1* 4.1*  ALBUMIN 1.8* 1.8* 1.7* 1.8* 1.8*    Recent Labs  Lab 03/08/22 0440 03/11/22 0327 03/12/22 0323  LIPASE 54* 43 47    Recent Labs  Lab 03/08/22 1149  AMMONIA <10    Coagulation profile Recent Labs  Lab 03/08/22 1149  INR 1.2    COVID-19 Labs  No results for input(s): "DDIMER", "FERRITIN", "LDH", "CRP" in the last 72 hours.   No results found for: "SARSCOV2NAA"  CBC: Recent Labs  Lab 03/08/22 0440 03/09/22 0350 03/11/22 0327 03/12/22 0323  WBC 4.9 4.0 4.4 4.4  HGB 7.2* 7.5* 7.6* 7.3*  HCT 22.8* 23.6* 23.7* 22.0*  MCV 97.4 97.5 95.2 94.4  PLT 164 172 198 182    Cardiac Enzymes: No results for input(s): "CKTOTAL", "CKMB", "CKMBINDEX", "TROPONINI" in the last 168 hours. BNP (last 3 results) No results for input(s): "PROBNP" in the last 8760 hours. CBG: No results for input(s): "GLUCAP" in the last 168 hours.  D-Dimer: No results for input(s): "DDIMER" in the last 72 hours. Hgb A1c: No results for input(s): "HGBA1C" in the last 72  hours. Lipid Profile: No results for input(s): "CHOL", "HDL", "LDLCALC", "TRIG", "CHOLHDL", "LDLDIRECT" in the last 72 hours. Thyroid function studies: No results for input(s): "TSH", "T4TOTAL", "T3FREE", "THYROIDAB" in the last 72 hours.  Invalid input(s): "FREET3" Anemia work up: No results for input(s): "VITAMINB12", "FOLATE", "FERRITIN", "TIBC", "IRON", "RETICCTPCT" in the last 72 hours.  Sepsis Labs: Recent Labs  Lab 03/08/22 0440 03/09/22 0350 03/11/22 0327 03/12/22 0323  WBC 4.9 4.0 4.4 4.4    Microbiology Recent Results (from the past 240 hour(s))  Culture, blood (single)     Status: None   Collection Time: 03/06/22  4:49 PM   Specimen: BLOOD  Result Value Ref Range Status   Specimen Description   Final    BLOOD RIGHT ANTECUBITAL BOTTLES DRAWN AEROBIC AND ANAEROBIC   Special Requests   Final    Blood Culture  results may not be optimal due to an excessive volume of blood received in culture bottles   Culture   Final    NO GROWTH 5 DAYS Performed at [facility], [address], [place], [place] [address]    Report Status 03/11/2022 FINAL  Final  Urine Culture     Status: None   Collection Time: 03/06/22  5:52 PM   Specimen: Urine, Catheterized  Result Value Ref Range Status   Specimen Description   Final    URINE, CATHETERIZED Performed at [facility], [address], [place], [place] [address]    Special Requests   Final    NONE Performed at [facility], [address], [place], [place] [address]    Culture   Final    NO GROWTH Performed at [facility], [address], [place], [place] [address]    Report Status 03/08/2022 FINAL  Final  Surgical pcr screen     Status: Abnormal   Collection Time: 03/10/22  4:42 PM   Specimen: Nasal Mucosa; Nasal Swab  Result Value Ref Range Status   MRSA, PCR POSITIVE (A) NEGATIVE Final    Comment: RESULT CALLED TO, READ BACK BY AND VERIFIED WITH: RN SIMON 03/10/22 @2210      Staphylococcus aureus POSITIVE (A)  NEGATIVE Final    Comment: (NOTE) The Xpert SA Assay (FDA approved for NASAL specimens in patients 79 years of age and older), is one component of a comprehensive surveillance program. It is not intended to diagnose infection nor to guide or monitor treatment. Performed at [facility], [address] [place], [place]      Medications:    [MAR Hold] Chlorhexidine Gluconate Cloth  6 each Topical Q0600   [MAR Hold] diclofenac  100 mg Rectal Once   [MAR Hold] enoxaparin (LOVENOX) injection  40 mg Subcutaneous Q24H   fentaNYL       [MAR Hold] melatonin  6 mg Oral QHS   [MAR Hold] mupirocin ointment  1 Application Nasal BID   [MAR Hold] pantoprazole  40 mg Oral Q0600   Continuous Infusions:  lactated ringers 30 mL/hr at 03/12/22 1058   lactated ringers 10 mL/hr at 03/14/22 0830      LOS: 8 days   03/16/22  [facility]  03/14/2022, 10:50 AM

## 2022-03-15 ENCOUNTER — Encounter (HOSPITAL_COMMUNITY): Payer: Self-pay | Admitting: General Surgery

## 2022-03-15 DIAGNOSIS — K8050 Calculus of bile duct without cholangitis or cholecystitis without obstruction: Secondary | ICD-10-CM | POA: Diagnosis not present

## 2022-03-15 DIAGNOSIS — K851 Biliary acute pancreatitis without necrosis or infection: Secondary | ICD-10-CM | POA: Diagnosis not present

## 2022-03-15 DIAGNOSIS — R933 Abnormal findings on diagnostic imaging of other parts of digestive tract: Secondary | ICD-10-CM | POA: Diagnosis not present

## 2022-03-15 NOTE — Progress Notes (Signed)
[facility] PROGRESS NOTE    Progress Note  Maria Dunlap  QQV:[id number] DOB: 03-22-1951 DOA: 03/06/2022 PCP: Sonny Masters, FNP     Brief Narrative:   Maria Dunlap is an 71 y.o. female past medical history significant for alcohol abuse, comes into the clinic with abdominal pain and vomiting along with multiple experience.  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  Gallstone pancreatitis GI was consulted who recommended transfer to [facility] possible ERCP, MRCP done that showed common bile duct with a 14 mm stone.    Assessment/Plan:   Acute gallstone pancreatitis CT findings suggestive of acute pancreatitis MRCP confirmed CBD stone of 14 mm. General surgery GI was consulted recommended transfer to [facility]. 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 General surgery perform laparoscopy cholecystectomy on 03/14/2022. Needs to get out of the bed to the chair. Physical therapy evaluation is pending. Did not eat anything postsurgically, will see how she does with a diet today.  Elevated lactic acid: Borderline elevated pancreatitis she is not septic.  Lower UTI: She reported dysuria, she reported he was on a course of antibiotics about 2-1/2 weeks ago. Was started on Rocephin for 3 days. Urine cultures were sent which are negative.  Prolonged QTc: Try the potassium greater than 4 magnesium greater than 2.  Normocytic anemia: Hemoglobin has remained relatively stable at 7.5.  Hypokalemia: Has improved, after oral repletion.    DVT prophylaxis: lovenox Family Communication:daughter Status is: Inpatient Remains inpatient appropriate because: Acute gallstone hepatitis.    Code Status:     Code Status Orders   (From admission, onward)           Start     Ordered   03/06/22 2102  Full code  Continuous        03/06/22 2104           Code Status History     Date Active Date Inactive Code Status Order ID Comments User Context   01/22/2022 2029 01/29/2022 1808 Full Code [id number]  Elgergawy, Leana Roe, MD Inpatient         IV Access:   Peripheral IV   Procedures and diagnostic studies:   No results found.   Medical Consultants:   None.   Subjective:    Maria Dunlap really her pain is not controlled.  Objective:    Vitals:   03/14/22 1100 03/14/22 1830 03/14/22 2009 03/15/22 0426  BP: 126/70 109/66 109/74 119/85  Pulse: (!) 57 81 90 98  Resp: 14 15 15 18   Temp: 98 F (36.7 C) 98.5 F (36.9 C) 98 F (36.7 C) 98.1 F (36.7 C)  TempSrc: Oral Oral Oral Oral  SpO2: 99% 96% 98% 95%  Weight:      Height:       SpO2: 95 %   Intake/Output Summary (Last 24 hours) at 03/15/2022 0921 Last data filed at 03/14/2022 1700 Gross per 24 hour  Intake 600 ml  Output 6 ml  Net 594 ml    Filed Weights   03/06/22 1821 03/14/22 0836  Weight: 51.5 kg 51.5 kg    Exam: General exam: In no acute distress. Respiratory system: Good air movement and clear to auscultation. Cardiovascular system: S1 & S2 heard, RRR. No JVD. Gastrointestinal system: Abdomen is nondistended, soft  and nontender.  Extremities: No pedal edema. Skin: No rashes, lesions or ulcers Psychiatry: Judgement and insight appear normal. Mood & affect appropriate. Data Reviewed:    Labs: Basic Metabolic Panel: Recent Labs  Lab 03/09/22 0350 03/10/22 0433 03/11/22 0327 03/12/22 0323 03/13/22 0420  NA 137 138 139 139 137  K 3.1* 3.3* 4.0 3.4* 4.6  CL 108 107 111 106 108  CO2 24 24 22 24 22   GLUCOSE 75 86 79 89 109*  BUN 6* <5* <5* <5* <5*  CREATININE 0.50 0.61 0.68 0.69 0.85  CALCIUM 8.0* 8.1* 8.3* 8.2* 8.1*    GFR Estimated Creatinine Clearance: 44.5 mL/min (by C-G formula based on  SCr of 0.85 mg/dL). Liver Function Tests: Recent Labs  Lab 03/09/22 0350 03/10/22 0433 03/11/22 0327 03/12/22 0323  AST 30 28 29 27   ALT 18 16 16 16   ALKPHOS 106 96 90 82  BILITOT 0.9 0.7 1.0 0.6  PROT 4.2* 3.9* 4.1* 4.1*  ALBUMIN 1.8* 1.7* 1.8* 1.8*    Recent Labs  Lab 03/11/22 0327 03/12/22 0323  LIPASE 43 47    Recent Labs  Lab 03/08/22 1149  AMMONIA <10    Coagulation profile Recent Labs  Lab 03/08/22 1149  INR 1.2    COVID-19 Labs  No results for input(s): "DDIMER", "FERRITIN", "LDH", "CRP" in the last 72 hours.   No results found for: "SARSCOV2NAA"  CBC: Recent Labs  Lab 03/09/22 0350 03/11/22 0327 03/12/22 0323  WBC 4.0 4.4 4.4  HGB 7.5* 7.6* 7.3*  HCT 23.6* 23.7* 22.0*  MCV 97.5 95.2 94.4  PLT 172 198 182    Cardiac Enzymes: No results for input(s): "CKTOTAL", "CKMB", "CKMBINDEX", "TROPONINI" in the last 168 hours. BNP (last 3 results) No results for input(s): "PROBNP" in the last 8760 hours. CBG: No results for input(s): "GLUCAP" in the last 168 hours.  D-Dimer: No results for input(s): "DDIMER" in the last 72 hours. Hgb A1c: No results for input(s): "HGBA1C" in the last 72 hours. Lipid Profile: No results for input(s): "CHOL", "HDL", "LDLCALC", "TRIG", "CHOLHDL", "LDLDIRECT" in the last 72 hours. Thyroid function studies: No results for input(s): "TSH", "T4TOTAL", "T3FREE", "THYROIDAB" in the last 72 hours.  Invalid input(s): "FREET3" Anemia work up: No results for input(s): "VITAMINB12", "FOLATE", "FERRITIN", "TIBC", "IRON", "RETICCTPCT" in the last 72 hours.  Sepsis Labs: Recent Labs  Lab 03/09/22 0350 03/11/22 0327 03/12/22 0323  WBC 4.0 4.4 4.4    Microbiology Recent Results (from the past 240 hour(s))  Culture, blood (single)     Status: None   Collection Time: 03/06/22  4:49 PM   Specimen: BLOOD  Result Value Ref Range Status   Specimen Description   Final    BLOOD RIGHT ANTECUBITAL BOTTLES DRAWN AEROBIC AND  ANAEROBIC   Special Requests   Final    Blood Culture results may not be optimal due to an excessive volume of blood received in culture bottles   Culture   Final    NO GROWTH 5 DAYS Performed at [facility], [address], [place], [place] [address]    Report Status 03/11/2022 FINAL  Final  Urine Culture     Status: None   Collection Time: 03/06/22  5:52 PM   Specimen: Urine, Catheterized  Result Value Ref Range Status   Specimen Description   Final    URINE, CATHETERIZED Performed at [facility], [address], [place], [place] [address]    Special Requests   Final    NONE Performed at  [facility], [address] [place], [place] [address]    Culture   Final    NO GROWTH Performed at [facility], [address] [place], [place] [address]    Report Status 03/08/2022 FINAL  Final  Surgical pcr screen     Status: Abnormal   Collection Time: 03/10/22  4:42 PM   Specimen: Nasal Mucosa; Nasal Swab  Result Value Ref Range Status   MRSA, PCR POSITIVE (A) NEGATIVE Final    Comment: RESULT CALLED TO, READ BACK BY AND VERIFIED WITH: RN SIMON 03/10/22 @2210      Staphylococcus aureus POSITIVE (A) NEGATIVE Final    Comment: (NOTE) The Xpert SA Assay (FDA approved for NASAL specimens in patients 67 years of age and older), is one component of a comprehensive surveillance program. It is not intended to diagnose infection nor to guide or monitor treatment. Performed at [facility], [address] [place], [place]      Medications:    Chlorhexidine Gluconate Cloth  6 each Topical Q0600   diclofenac  100 mg Rectal Once   enoxaparin (LOVENOX) injection  40 mg Subcutaneous Q24H   melatonin  6 mg Oral QHS   mupirocin ointment  1 Application Nasal BID   pantoprazole  40 mg Oral Q0600   Continuous Infusions:  lactated ringers 30 mL/hr at 03/12/22 1058      LOS: 9 days   03/14/22  [facility]  03/15/2022, 9:21 AM

## 2022-03-15 NOTE — Progress Notes (Signed)
1 Day Post-Op   Subjective/Chief Complaint: Patient complains of abdominal pain.  She has not been out of bed once out of bed.   Objective: Vital signs in last 24 hours: Temp:  [97.4 F (36.3 C)-98.5 F (36.9 C)] 98.1 F (36.7 C) (10/23 0426) Pulse Rate:  [54-98] 98 (10/23 0426) Resp:  [12-18] 18 (10/23 0426) BP: (109-142)/(66-85) 119/85 (10/23 0426) SpO2:  [94 %-99 %] 95 % (10/23 0426) Last BM Date : 03/10/22  Intake/Output from previous day: 10/22 0701 - 10/23 0700 In: 600 [I.V.:600] Out: 6 [Urine:1; Blood:5] Intake/Output this shift: No intake/output data recorded.  Abdomen: Port sites clean dry intact.  Soft.  Tenderness noted over the right upper quadrant consistent with port sites.  No signs of peritonitis though.  Lab Results:  No results for input(s): "WBC", "HGB", "HCT", "PLT" in the last 72 hours. BMET Recent Labs    03/13/22 0420  NA 137  K 4.6  CL 108  CO2 22  GLUCOSE 109*  BUN <5*  CREATININE 0.85  CALCIUM 8.1*   PT/INR No results for input(s): "LABPROT", "INR" in the last 72 hours. ABG No results for input(s): "PHART", "HCO3" in the last 72 hours.  Invalid input(s): "PCO2", "PO2"  Studies/Results: No results found.  Anti-infectives: Anti-infectives (From admission, onward)    Start     Dose/Rate Route Frequency Ordered Stop   03/14/22 0900  vancomycin (VANCOCIN) IVPB 1000 mg/200 mL premix        1,000 mg 200 mL/hr over 60 Minutes Intravenous  Once 03/14/22 0812 03/14/22 0940   03/12/22 1300  ciprofloxacin (CIPRO) IVPB 400 mg  Status:  Discontinued        400 mg 200 mL/hr over 60 Minutes Intravenous  Once 03/12/22 1254 03/12/22 1320   03/07/22 0400  cefTRIAXone (ROCEPHIN) 1 g in sodium chloride 0.9 % 100 mL IVPB        1 g 200 mL/hr over 30 Minutes Intravenous Every 24 hours 03/06/22 2104 03/09/22 0346   03/06/22 2200  ceFEPIme (MAXIPIME) 2 g in sodium chloride 0.9 % 100 mL IVPB  Status:  Discontinued        2 g 200 mL/hr over 30 Minutes  Intravenous Every 8 hours 03/06/22 1817 03/06/22 1822   03/06/22 2200  ceFEPIme (MAXIPIME) 2 g in sodium chloride 0.9 % 100 mL IVPB  Status:  Discontinued        2 g 200 mL/hr over 30 Minutes Intravenous Every 12 hours 03/06/22 1822 03/06/22 2104   03/06/22 1645  ceFEPIme (MAXIPIME) 2 g in sodium chloride 0.9 % 100 mL IVPB  Status:  Discontinued       See Hyperspace for full Linked Orders Report.   2 g 200 mL/hr over 30 Minutes Intravenous  Once 03/06/22 1640 03/06/22 1642   03/06/22 1645  metroNIDAZOLE (FLAGYL) IVPB 500 mg  Status:  Discontinued       See Hyperspace for full Linked Orders Report.   500 mg 100 mL/hr over 60 Minutes Intravenous  Once 03/06/22 1640 03/06/22 1642   03/06/22 1645  ceFEPIme (MAXIPIME) 2 g in sodium chloride 0.9 % 100 mL IVPB        2 g 200 mL/hr over 30 Minutes Intravenous  Once 03/06/22 1642 03/06/22 1741   03/06/22 1645  metroNIDAZOLE (FLAGYL) IVPB 500 mg        500 mg 100 mL/hr over 60 Minutes Intravenous  Once 03/06/22 1642 03/06/22 1933       Assessment/Plan: s/p Procedure(s):  LAPAROSCOPIC CHOLECYSTECTOMY (N/A)   Gallstone pancreatitis Choledocholithiasis - ERCP on 10/20 with stone extraction -question history of cirrhosis but CT/MRI/labs are all normal suggesting no evidence of cirrhosis.  GI suggested may have a history of ETOH hepatitis -Postop day 1 status post laparoscopic cholecystectomy Recommend getting patient out of bed, PT, ambulation, and discharge when able to ambulate, tolerate p.o. pain medication and tolerated her diet.  She has not been out of bed much at this point.  She can be discharged once the above goals are met. ID - none currently VTE - SCDs, lovenox FEN - IVF, soft diet Foley - none     Memory impairment, prior diagnosis of Warnicke encephalopathy    LOS: 9 days    Turner Daniels  MD  03/15/2022

## 2022-03-15 NOTE — Progress Notes (Signed)
Mobility Specialist Progress Note   03/15/22 1133  Pain Assessment  Pain Assessment Faces  Faces Pain Scale 4  Pain Location Operative site  Pain Descriptors / Indicators Grimacing;Operative site guarding  Pain Intervention(s) Limited activity within patient's tolerance  Mobility  Activity Ambulated with assistance to bathroom;Transferred from bed to chair  Level of Assistance Minimal assist, patient does 75% or more  Assistive Device Front wheel walker  Distance Ambulated (ft) 20 ft  Range of Motion/Exercises Active;All extremities  Activity Response Tolerated well   Patient received in supine presenting with confusion and reluctant to participation. With encouragement from myself and family patient agreeable to mobility. Required min A + max cues to EOB from supine to sit. Stood and ambulated to and from restroom with minimal HHA.  Tolerated without incident and only complained of pain at operative site. Was left in recliner with all needs met, call bell in reach.   [place] Edina Winning[place], [place], [place]  Office: [phone]

## 2022-03-15 NOTE — Evaluation (Signed)
Physical Therapy Evaluation Patient Details Name: Maria Dunlap MRN: [id number] DOB: July 29, 1950 Today's Date: 03/15/2022  History of Present Illness  Pt is 71 yo female admitted 03/06/22 with abdominal pain and vomiting and found to have gallstone pancreatitis and s/p lap chole 03/14/22.  Pt with hx including ETOH abuse, recent hyponatremia and encephalopathy thought to be Warnicke's encephalopathy, GERD, gout  Clinical Impression  Pt admitted with above diagnosis. Pt from home with near 24 hr supervision from spouse.  She is ambulatory with supervision and tends to "furniture surf."  Pt recently confused and remains pleasantly confused - at current baseline.  Today, she was able to transfer with min guard and ambulated 150' with RW and min A for RW.  Pt overall moving fairly well - just limited by safety awareness. She was receiving [facility] services prior to admission - recommend continuing [facility] services.  Pt currently with functional limitations due to the deficits listed below (see PT Problem List). Pt will benefit from skilled PT to increase their independence and safety with mobility to allow discharge to the venue listed below.          Recommendations for follow up therapy are one component of a multi-disciplinary discharge planning process, led by the attending physician.  Recommendations may be updated based on patient status, additional functional criteria and insurance authorization.  Follow Up Recommendations Home health PT      Assistance Recommended at Discharge Frequent or constant Supervision/Assistance  Patient can return home with the following  A little help with walking and/or transfers;A little help with bathing/dressing/bathroom;Assistance with cooking/housework;Help with stairs or ramp for entrance    Equipment Recommendations None recommended by PT  Recommendations for Other Services       Functional Status Assessment Patient has had a recent decline in their  functional status and demonstrates the ability to make significant improvements in function in a reasonable and predictable amount of time.     Precautions / Restrictions Precautions Precautions: Fall      Mobility  Bed Mobility               General bed mobility comments: in chair at arrival; spouse reports he has assisted with stand pivots and bed mobility    Transfers Overall transfer level: Needs assistance Equipment used: 1 person hand held assist Transfers: Sit to/from Stand Sit to Stand: Min guard           General transfer comment: cues for hand placement    Ambulation/Gait Ambulation/Gait assistance: Min assist Gait Distance (Feet): 150 Feet Assistive device: Rolling walker (2 wheels) Gait Pattern/deviations: Step-to pattern, Decreased stride length Gait velocity: decreased     General Gait Details: Assist for RW management but otherwise no phyiscal assist required.  Attempted cues for RW proximity but pt with difficulty following (confused and HOH  Stairs            Wheelchair Mobility    Modified Rankin (Stroke Patients Only)       Balance Overall balance assessment: Needs assistance Sitting-balance support: No upper extremity supported Sitting balance-Leahy Scale: Good     Standing balance support: No upper extremity supported, Bilateral upper extremity supported Standing balance-Leahy Scale: Fair Standing balance comment: could static stand without support but needs at least single UE support for mobility                             Pertinent Vitals/Pain Pain Assessment Pain Assessment:  Faces Faces Pain Scale: Hurts a little bit Pain Location: Operative site Pain Descriptors / Indicators: Grimacing, Operative site guarding Pain Intervention(s): Limited activity within patient's tolerance, Monitored during session, Repositioned    Home Living Family/patient expects to be discharged to:: Private residence Living  Arrangements: Spouse/significant other Available Help at Discharge: Family;Available 24 hours/day Type of Home: House Home Access: Stairs to enter Entrance Stairs-Rails: None Entrance Stairs-Number of Steps: 1   Home Layout: Two level;Able to live on main level with bedroom/bathroom Home Equipment: Rolling Walker (2 wheels);Cane - single point;BSC/3in1      Prior Function Prior Level of Function : Needs assist             Mobility Comments: Pt could ambulate in house with supervision  - tended to furniture surf; getting HHPT/OT and RN ADLs Comments: Spouse assisted with ADLs     Hand Dominance        Extremity/Trunk Assessment   Upper Extremity Assessment Upper Extremity Assessment: Overall WFL for tasks assessed;Difficult to assess due to impaired cognition (Does not follow MMT commands but no major deficits)    Lower Extremity Assessment Lower Extremity Assessment: Overall WFL for tasks assessed;Difficult to assess due to impaired cognition (Does not follow MMT commands but no major deficits)    Cervical / Trunk Assessment Cervical / Trunk Assessment: Normal  Communication      Cognition Arousal/Alertness: Awake/alert Behavior During Therapy: WFL for tasks assessed/performed Overall Cognitive Status: History of cognitive impairments - at baseline                                 General Comments: pleasantly confused; HOH; follows general commands with multimodal cues        General Comments      Exercises     Assessment/Plan    PT Assessment Patient needs continued PT services  PT Problem List Decreased strength;Decreased mobility;Decreased safety awareness;Decreased coordination;Decreased activity tolerance;Decreased cognition;Decreased balance;Decreased knowledge of use of DME       PT Treatment Interventions Therapeutic activities;DME instruction;Modalities;Gait training;Therapeutic exercise;Patient/family education;Stair  training;Balance training;Functional mobility training    PT Goals (Current goals can be found in the Care Plan section)  Acute Rehab PT Goals Patient Stated Goal: pt too confused to state; spouse reports return home hopefully tomorrow PT Goal Formulation: With family Time For Goal Achievement: 03/29/22 Potential to Achieve Goals: Good    Frequency Min 3X/week     Co-evaluation               AM-PAC PT "6 Clicks" Mobility  Outcome Measure Help needed turning from your back to your side while in a flat bed without using bedrails?: None Help needed moving from lying on your back to sitting on the side of a flat bed without using bedrails?: A Little Help needed moving to and from a bed to a chair (including a wheelchair)?: A Little Help needed standing up from a chair using your arms (e.g., wheelchair or bedside chair)?: A Little Help needed to walk in hospital room?: A Little Help needed climbing 3-5 steps with a railing? : A Little 6 Click Score: 19    End of Session Equipment Utilized During Treatment: Gait belt Activity Tolerance: Patient tolerated treatment well Patient left: in chair;with call bell/phone within reach;with family/visitor present (no alarm on at arrival, spouse reports he helps her back to bed; he will notify staff if he leaves so no alarm applied)  Nurse Communication: Mobility status PT Visit Diagnosis: Muscle weakness (generalized) (M62.81);Other abnormalities of gait and mobility (R26.89)    Time: 7673-4193 PT Time Calculation (min) (ACUTE ONLY): 18 min   Charges:   PT Evaluation $PT Eval Low Complexity: 1 Low          Murad Staples, PT Acute Rehab [facility] Rehab [phone]   Rayetta Humphrey 03/15/2022, 3:37 PM

## 2022-03-15 NOTE — Anesthesia Postprocedure Evaluation (Signed)
Anesthesia Post Note  Patient: Maria Dunlap  Procedure(s) Performed: ENDOSCOPIC RETROGRADE CHOLANGIOPANCREATOGRAPHY (ERCP) WITH PROPOFOL SPHINCTEROTOMY REMOVAL OF STONES     Patient location during evaluation: Endoscopy Anesthesia Type: General Level of consciousness: patient cooperative and awake Pain management: pain level controlled Vital Signs Assessment: post-procedure vital signs reviewed and stable Respiratory status: spontaneous breathing, nonlabored ventilation and respiratory function stable Cardiovascular status: blood pressure returned to baseline and stable Postop Assessment: no apparent nausea or vomiting Anesthetic complications: no   No notable events documented.  Last Vitals:  Vitals:   03/14/22 2009 03/15/22 0426  BP: 109/74 119/85  Pulse: 90 98  Resp: 15 18  Temp: 36.7 C 36.7 C  SpO2: 98% 95%    Last Pain:  Vitals:   03/15/22 0451  TempSrc:   PainSc: Asleep                 [name]

## 2022-03-16 DIAGNOSIS — K851 Biliary acute pancreatitis without necrosis or infection: Secondary | ICD-10-CM | POA: Diagnosis not present

## 2022-03-16 DIAGNOSIS — R933 Abnormal findings on diagnostic imaging of other parts of digestive tract: Secondary | ICD-10-CM | POA: Diagnosis not present

## 2022-03-16 DIAGNOSIS — E876 Hypokalemia: Secondary | ICD-10-CM | POA: Diagnosis not present

## 2022-03-16 DIAGNOSIS — K7030 Alcoholic cirrhosis of liver without ascites: Secondary | ICD-10-CM | POA: Diagnosis not present

## 2022-03-16 LAB — SURGICAL PATHOLOGY

## 2022-03-16 MED ORDER — POLYETHYLENE GLYCOL 3350 17 G PO PACK: 17.0000 g | PACK | Freq: Every day | ORAL | 0 refills | Status: AC

## 2022-03-16 MED ORDER — DOCUSATE SODIUM 100 MG PO CAPS: 100.0000 mg | ORAL_CAPSULE | Freq: Two times a day (BID) | ORAL | Status: DC

## 2022-03-16 MED ORDER — POLYETHYLENE GLYCOL 3350 17 G PO PACK: 17.0000 g | PACK | Freq: Two times a day (BID) | ORAL | Status: DC

## 2022-03-16 MED ORDER — OXYCODONE HCL 5 MG PO TABS: 5.0000 mg | ORAL_TABLET | Freq: Four times a day (QID) | ORAL | 0 refills | Status: DC | PRN

## 2022-03-16 NOTE — Discharge Summary (Signed)
Physician Discharge Summary  Maria Dunlap WER:[id number] DOB: [birth year] DOA: 03/06/2022  PCP: Baruch Gouty, FNP  Admit date: 03/06/2022 Discharge date: 03/16/2022  Admitted From: Home Disposition:  Home  Recommendations for Outpatient Follow-up:  Follow up with surgery in 1-2 weeks Please obtain BMP/CBC in one week   Home Health:No Equipment/Devices:None  Discharge Condition:Stable CODE STATUS:Full Diet recommendation: Heart Healthy  Brief/Interim Summary: 71 y.o. female past medical history significant for alcohol abuse, comes into the clinic with abdominal pain and vomiting along with multiple experience.  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  Gallstone pancreatitis GI was consulted who recommended transfer to [facility] possible ERCP, MRCP done that showed common bile duct with a 14 mm stone.  Discharge Diagnoses:  Principal Problem:   Acute gallstone pancreatitis Active Problems:   Hypokalemia   Prediabetes   Anemia   Cirrhosis of liver without ascites (HCC)   Prolonged QT interval   Lower urinary tract infectious disease   Hepatitis   Elevated lactic acid level   Common bile duct stone   Abnormal magnetic resonance cholangiopancreatography (MRCP)  Acute gallstone pancreatitis: CT of the abdomen and pelvis was suggestive of acute pancreatitis. MRCP confirms CBD stone 14 mm. General surgery and GI were consulted she was transferred to [facility]. GI perform MRCP and she is status post sphincterectomy and stone extraction. General surgery performed laparoscopic cholecystectomy on 03/14/2022. Physical therapy was consulted and they recommended home health PT. She was a to tolerate her diet and she had bowel movements.  Elevated lactic acid: She  was not septic on admission borderline likely due to pancreatitis.  UTI: She received a 3-day of Rocephin.  Long QTc: Resolved try to keep potassium greater than 4 magnesium greater than 2.  Normocytic anemia: Her hemoglobin remained relatively stable.  Hypokalemia replete orally now resolved likely related to her prolonged QTc.   Discharge Instructions  Discharge Instructions     Diet - low sodium heart healthy   Complete by: As directed    Increase activity slowly   Complete by: As directed    No wound care   Complete by: As directed       Allergies as of 03/16/2022       Reactions   Amoxicillin Swelling   Neck swell Throat swelling   Allopurinol Other (See Comments)   Elevated LFT's Elevated LFT's   Macrolides And Ketolides    Mycinette [phenol]    Nitrofurantoin Other (See Comments)   Simvastatin Other (See Comments)   Elevated LFT's Elevated LFT's   Rosuvastatin Rash   dizziness   Triamcinolone Rash, Other (See Comments)   Triamcinolone Acetonide Rash        Medication List     TAKE these medications    Chelated Zinc 50 MG Tabs Take 50 mg by mouth daily.   cyanocobalamin 500 MCG tablet Commonly known as: VITAMIN B12 Take 500 mcg by mouth every other day.   folic acid 195 MCG tablet Commonly known as: FOLVITE Take 400 mcg by mouth daily.   magnesium oxide 400 MG tablet Commonly known as: MAG-OX Take 400 mg by mouth daily.   oxyCODONE 5 MG immediate release tablet Commonly known as: Oxy IR/ROXICODONE Take 1 tablet (5 mg total) by mouth every 6 (six) hours as needed for severe pain.  polyethylene glycol 17 g packet Commonly known as: MIRALAX / GLYCOLAX Take 17 g by mouth daily for 2 days.   potassium chloride SA 20 MEQ tablet Commonly known as: KLOR-CON M Take 1 tablet (20 mEq total) by mouth daily.   thiamine 100 MG tablet Commonly known as: VITAMIN B1 Take 100 mg by mouth daily.   vitamin C with rose hips 500 MG tablet Take  500 mg by mouth daily.        Follow-up Information     Maczis, Carlena Hurl, PA-C Follow up in 3 week(s).   Specialty: General Surgery Why: Office will call you with a follow up appointment, If you don't hear from the office, please call, Arrive 30 minutes prior to your appointment time, Please bring your insurance card and photo ID Contact information: [place] [address] [place], [place], [place]. Schedule an appointment as soon as possible for a visit.   Specialty: Family Medicine Contact information: [place] [address] [phone]                Allergies  Allergen Reactions   Amoxicillin Swelling    Neck swell Throat swelling   Allopurinol Other (See Comments)    Elevated LFT's Elevated LFT's   Macrolides And Ketolides    Mycinette [Phenol]    Nitrofurantoin Other (See Comments)   Simvastatin Other (See Comments)    Elevated LFT's  Elevated LFT's   Rosuvastatin Rash    dizziness   Triamcinolone Rash and Other (See Comments)   Triamcinolone Acetonide Rash    Consultations: GI General surgery   Procedures/Studies: DG ERCP  Result Date: 03/12/2022 CLINICAL DATA:  71 year old female with cholelithiasis EXAM: ERCP TECHNIQUE: Multiple spot images obtained with the fluoroscopic device and submitted for interpretation post-procedure. FLUOROSCOPY: Radiation Exposure Index (as provided by the fluoroscopic device): 39 mGy Kerma COMPARISON:  MR 03/08/2022 FINDINGS: Limited images during ERCP. Initial image demonstrates endoscope projecting over the upper abdomen. There is subsequently placement of a safety wire and retrograde infusion of contrast. Deployment of a retrieval balloon. IMPRESSION: Limited images during ERCP demonstrates deployment of retrieval balloon within the common bile duct. Please refer to the dictated operative report for full details of  intraoperative findings and procedure. Electronically Signed   By: Corrie Mckusick D.O.   On: 03/12/2022 13:08   MR ABDOMEN MRCP W WO CONTAST  Result Date: 03/08/2022 CLINICAL DATA:  Cholelithiasis.  Pancreatitis suspected. EXAM: MRI ABDOMEN WITHOUT AND WITH CONTRAST (INCLUDING MRCP) TECHNIQUE: Multiplanar multisequence MR imaging of the abdomen was performed both before and after the administration of intravenous contrast. Heavily T2-weighted images of the biliary and pancreatic ducts were obtained, and three-dimensional MRCP images were rendered by post processing. CONTRAST:  16mL GADAVIST GADOBUTROL 1 MMOL/ML IV SOLN COMPARISON:  CTA abdomen/pelvis dated 03/06/2022. FINDINGS: Motion degraded images. Lower chest: Trace bilateral pleural effusions. Hepatobiliary: Liver is grossly unremarkable. No hepatic steatosis. No suspicious/enhancing hepatic lesions. Layering gallstones (series 9/image 15), without gallbladder wall thickening or pericholecystic inflammatory changes to suggest acute cholecystitis. Mild central intrahepatic ductal dilatation (series 4/image 11). Dilated common duct, measuring 14 mm (series 3/image 13). Suspected tiny layering gallstones in the distal CBD (series 13/image 13). Pancreas: Trace peripancreatic fluid along the posterior pancreatic head/uncinate process (series 4/image 19), although improved from prior CT. No drainable fluid collection/pseudocyst. Spleen:  Within normal limits. Adrenals/Urinary Tract:  Adrenal glands are  within normal limits. Kidneys are within normal limits.  No hydronephrosis. Stomach/Bowel: Stomach is within normal limits. Visualized bowel is notable for right colonic diverticulosis, without associated inflammatory changes. Vascular/Lymphatic:  No evidence of abdominal aortic aneurysm. No suspicious abdominal lymphadenopathy. Other:  No abdominal ascites. Musculoskeletal: Mild degenerative changes of the lumbar spine with dextroscoliosis. IMPRESSION: Motion  degraded images. Cholelithiasis, without evidence of acute cholecystitis. Mild central intrahepatic and extrahepatic ductal dilatation. Dilated common duct, measuring 14 mm. Suspected choledocholithiasis with tiny layering gallstones in the distal CBD. Consider ERCP as clinically warranted. Trace peripancreatic fluid along the posterior pancreatic head/uncinate process, suggesting mild acute pancreatitis, although improved from prior CT. No associated complication. Electronically Signed   By: Julian Hy M.D.   On: 03/08/2022 19:58   MR 3D Recon At Scanner  Result Date: 03/08/2022 CLINICAL DATA:  Cholelithiasis.  Pancreatitis suspected. EXAM: MRI ABDOMEN WITHOUT AND WITH CONTRAST (INCLUDING MRCP) TECHNIQUE: Multiplanar multisequence MR imaging of the abdomen was performed both before and after the administration of intravenous contrast. Heavily T2-weighted images of the biliary and pancreatic ducts were obtained, and three-dimensional MRCP images were rendered by post processing. CONTRAST:  56mL GADAVIST GADOBUTROL 1 MMOL/ML IV SOLN COMPARISON:  CTA abdomen/pelvis dated 03/06/2022. FINDINGS: Motion degraded images. Lower chest: Trace bilateral pleural effusions. Hepatobiliary: Liver is grossly unremarkable. No hepatic steatosis. No suspicious/enhancing hepatic lesions. Layering gallstones (series 9/image 15), without gallbladder wall thickening or pericholecystic inflammatory changes to suggest acute cholecystitis. Mild central intrahepatic ductal dilatation (series 4/image 11). Dilated common duct, measuring 14 mm (series 3/image 13). Suspected tiny layering gallstones in the distal CBD (series 13/image 13). Pancreas: Trace peripancreatic fluid along the posterior pancreatic head/uncinate process (series 4/image 19), although improved from prior CT. No drainable fluid collection/pseudocyst. Spleen:  Within normal limits. Adrenals/Urinary Tract:  Adrenal glands are within normal limits. Kidneys are within  normal limits.  No hydronephrosis. Stomach/Bowel: Stomach is within normal limits. Visualized bowel is notable for right colonic diverticulosis, without associated inflammatory changes. Vascular/Lymphatic:  No evidence of abdominal aortic aneurysm. No suspicious abdominal lymphadenopathy. Other:  No abdominal ascites. Musculoskeletal: Mild degenerative changes of the lumbar spine with dextroscoliosis. IMPRESSION: Motion degraded images. Cholelithiasis, without evidence of acute cholecystitis. Mild central intrahepatic and extrahepatic ductal dilatation. Dilated common duct, measuring 14 mm. Suspected choledocholithiasis with tiny layering gallstones in the distal CBD. Consider ERCP as clinically warranted. Trace peripancreatic fluid along the posterior pancreatic head/uncinate process, suggesting mild acute pancreatitis, although improved from prior CT. No associated complication. Electronically Signed   By: Julian Hy M.D.   On: 03/08/2022 19:58   CT Angio Abd/Pel W and/or Wo Contrast  Result Date: 03/06/2022 CLINICAL DATA:  Abdominal pain, diarrhea, evaluate for mesenteric ischemia EXAM: CTA ABDOMEN AND PELVIS WITHOUT AND WITH CONTRAST TECHNIQUE: Multidetector CT imaging of the abdomen and pelvis was performed using the standard protocol during bolus administration of intravenous contrast. Multiplanar reconstructed images and MIPs were obtained and reviewed to evaluate the vascular anatomy. RADIATION DOSE REDUCTION: This exam was performed according to the departmental dose-optimization program which includes automated exposure control, adjustment of the mA and/or kV according to patient size and/or use of iterative reconstruction technique. CONTRAST:  50mL OMNIPAQUE IOHEXOL 350 MG/ML SOLN COMPARISON:  CT abdomen/pelvis dated 01/09/2022 FINDINGS: VASCULAR Aorta: No evidence of abdominal aortic aneurysm. Patent. Atherosclerotic calcifications. Celiac: Patent. SMA: Patent. Renals: Patent bilaterally.  Atherosclerotic calcifications at the origin bilaterally. IMA: Patent. Inflow: Patent bilaterally. Atherosclerotic calcifications. Proximal Outflow: Patent bilaterally. Atherosclerotic calcifications. Veins: Within normal limits. Review  of the MIP images confirms the above findings. NON-VASCULAR Lower chest: Lung bases are clear. Hepatobiliary: Liver is within normal limits. Suspected layering small gallstones versus gallbladder sludge (series 4/image 59). No convincing pericholecystic inflammatory changes. No intrahepatic ductal dilatation. Mildly dilated common duct, measuring 12 mm (series 9/image 57). Pancreas: Peripancreatic inflammatory changes along the pancreatic head since uncinate process and pancreaticoduodenal groove, suggesting acute pancreatitis. No drainable fluid collection/walled-off necrosis. Spleen: Within normal limits. Adrenals/Urinary Tract: Adrenal glands are within normal limits. 7 mm cyst in the lateral interpolar right kidney (series 11/image 33). Left kidney is within normal limits. No hydronephrosis. Thick-walled bladder with asymmetric wall thickening on the right (series 11/image 74), new from the prior. Stomach/Bowel: Stomach is within normal limits. No evidence of bowel obstruction. Inflammatory changes along the duodenum, suggesting duodenitis, although favored to be secondary. Normal appendix (series 11/image 85). Scattered mild right colonic diverticulosis, without evidence of diverticulitis. No colonic wall thickening or inflammatory changes. No pneumatosis or free air. Lymphatic: No suspicious abdominopelvic lymphadenopathy. Reproductive: Status post hysterectomy. No adnexal masses. Other: No abdominopelvic ascites. Musculoskeletal: Mild lumbar dextroscoliosis with mild degenerative changes. IMPRESSION: Patent abdominal vasculature. No findings suspicious for mesenteric ischemia. Peripancreatic inflammatory changes/fluid, suggesting acute pancreatitis. No associated complication.  Suspected secondary duodenitis. Cholelithiasis, without associated findings to suggest acute cholecystitis. Mildly dilated common duct, measuring 12 mm. Correlate with LFTs and consider ERCP or MRCP to exclude choledocholithiasis as clinically warranted. Asymmetric bladder wall thickening, new, correlate for cystitis. Electronically Signed   By: Julian Hy M.D.   On: 03/06/2022 18:49   DG Chest Port 1 View  Result Date: 03/06/2022 CLINICAL DATA:  Upper abdominal pain EXAM: PORTABLE CHEST 1 VIEW COMPARISON:  01/22/2022 FINDINGS: The heart size and mediastinal contours are within normal limits. Both lungs are clear. The visualized skeletal structures are unremarkable. IMPRESSION: No acute abnormality of the lungs in AP portable projection. Electronically Signed   By: Delanna Ahmadi M.D.   On: 03/06/2022 15:49   (Echo, Carotid, EGD, Colonoscopy, ERCP)    Subjective: Tolerated her diet has no new complaints.  Discharge Exam: Vitals:   03/15/22 2030 03/16/22 0755  BP: 111/71 128/85  Pulse: 90 76  Resp: 18 18  Temp:  98.7 F (37.1 C)  SpO2: 97% 95%   Vitals:   03/15/22 0426 03/15/22 1409 03/15/22 2030 03/16/22 0755  BP: 119/85 100/67 111/71 128/85  Pulse: 98 87 90 76  Resp: 18 16 18 18   Temp: 98.1 F (36.7 C) 98.3 F (36.8 C) 98.5 F (36.9 C) 98.7 F (37.1 C)  TempSrc: Oral Oral Oral Oral  SpO2: 95% 100% 97% 95%  Weight:      Height:        General: Pt is alert, awake, not in acute distress Cardiovascular: RRR, S1/S2 +, no rubs, no gallops Respiratory: CTA bilaterally, no wheezing, no rhonchi Abdominal: Soft, NT, ND, bowel sounds + Extremities: no edema, no cyanosis    The results of significant diagnostics from this hospitalization (including imaging, microbiology, ancillary and laboratory) are listed below for reference.     Microbiology: Recent Results (from the past 240 hour(s))  Culture, blood (single)     Status: None   Collection Time: 03/06/22  4:49 PM    Specimen: BLOOD  Result Value Ref Range Status   Specimen Description   Final    BLOOD RIGHT ANTECUBITAL BOTTLES DRAWN AEROBIC AND ANAEROBIC   Special Requests   Final    Blood Culture results may not be optimal due  to an excessive volume of blood received in culture bottles   Culture   Final    NO GROWTH 5 DAYS Performed at [facility], [address], [place], [place] [address]    Report Status 03/11/2022 FINAL  Final  Urine Culture     Status: None   Collection Time: 03/06/22  5:52 PM   Specimen: Urine, Catheterized  Result Value Ref Range Status   Specimen Description   Final    URINE, CATHETERIZED Performed at [facility], [address] [place], [place] [address]    Special Requests   Final    NONE Performed at [facility], [address] [place], [place] [address]    Culture   Final    NO GROWTH Performed at [facility], [place] [address] [place], [place] [address]    Report Status 03/08/2022 FINAL  Final  Surgical pcr screen     Status: Abnormal   Collection Time: 03/10/22  4:42 PM   Specimen: Nasal Mucosa; Nasal Swab  Result Value Ref Range Status   MRSA, PCR POSITIVE (A) NEGATIVE Final    Comment: RESULT CALLED TO, READ BACK BY AND VERIFIED WITH: RN SIMON 03/10/22 @2210      Staphylococcus aureus POSITIVE (A) NEGATIVE Final    Comment: (NOTE) The Xpert SA Assay (FDA approved for NASAL specimens in patients 62 years of age and older), is one component of a comprehensive surveillance program. It is not intended to diagnose infection nor to guide or monitor treatment. Performed at [facility], [place] [address], [place], [place] [address]      Labs: BNP (last 3 results) No results for input(s): "BNP" in the last 8760 hours. Basic Metabolic Panel: Recent Labs  Lab 03/10/22 0433 03/11/22 0327 03/12/22 0323 03/13/22 0420  NA 138 139 139 137  K 3.3* 4.0 3.4* 4.6  CL 107 111 106 108  CO2 24 22 24 22   GLUCOSE 86 79 89 109*  BUN <5* <5* <5* <5*   CREATININE 0.61 0.68 0.69 0.85  CALCIUM 8.1* 8.3* 8.2* 8.1*   Liver Function Tests: Recent Labs  Lab 03/10/22 0433 03/11/22 0327 03/12/22 0323  AST 28 29 27   ALT 16 16 16   ALKPHOS 96 90 82  BILITOT 0.7 1.0 0.6  PROT 3.9* 4.1* 4.1*  ALBUMIN 1.7* 1.8* 1.8*   Recent Labs  Lab 03/11/22 0327 03/12/22 0323  LIPASE 43 47   No results for input(s): "AMMONIA" in the last 168 hours. CBC: Recent Labs  Lab 03/11/22 0327 03/12/22 0323  WBC 4.4 4.4  HGB 7.6* 7.3*  HCT 23.7* 22.0*  MCV 95.2 94.4  PLT 198 182   Cardiac Enzymes: No results for input(s): "CKTOTAL", "CKMB", "CKMBINDEX", "TROPONINI" in the last 168 hours. BNP: Invalid input(s): "POCBNP" CBG: No results for input(s): "GLUCAP" in the last 168 hours. D-Dimer No results for input(s): "DDIMER" in the last 72 hours. Hgb A1c No results for input(s): "HGBA1C" in the last 72 hours. Lipid Profile No results for input(s): "CHOL", "HDL", "LDLCALC", "TRIG", "CHOLHDL", "LDLDIRECT" in the last 72 hours. Thyroid function studies No results for input(s): "TSH", "T4TOTAL", "T3FREE", "THYROIDAB" in the last 72 hours.  Invalid input(s): "FREET3" Anemia work up No results for input(s): "VITAMINB12", "FOLATE", "FERRITIN", "TIBC", "IRON", "RETICCTPCT" in the last 72 hours. Urinalysis    Component Value Date/Time   COLORURINE YELLOW 03/06/2022 1625   APPEARANCEUR HAZY (A) 03/06/2022 1625   APPEARANCEUR Clear 02/18/2022 1039   LABSPEC 1.016 03/06/2022 1625  PHURINE 5.0 03/06/2022 1625   GLUCOSEU NEGATIVE 03/06/2022 1625   HGBUR NEGATIVE 03/06/2022 1625   BILIRUBINUR NEGATIVE 03/06/2022 1625   BILIRUBINUR Negative 02/18/2022 1039   KETONESUR 5 (A) 03/06/2022 1625   PROTEINUR NEGATIVE 03/06/2022 1625   UROBILINOGEN negative 08/13/2013 1218   NITRITE NEGATIVE 03/06/2022 1625   LEUKOCYTESUR LARGE (A) 03/06/2022 1625   Sepsis Labs Recent Labs  Lab 03/11/22 0327 03/12/22 0323  WBC 4.4 4.4   Microbiology Recent Results  (from the past 240 hour(s))  Culture, blood (single)     Status: None   Collection Time: 03/06/22  4:49 PM   Specimen: BLOOD  Result Value Ref Range Status   Specimen Description   Final    BLOOD RIGHT ANTECUBITAL BOTTLES DRAWN AEROBIC AND ANAEROBIC   Special Requests   Final    Blood Culture results may not be optimal due to an excessive volume of blood received in culture bottles   Culture   Final    NO GROWTH 5 DAYS Performed at [facility], [address], [place], [place] [address]    Report Status 03/11/2022 FINAL  Final  Urine Culture     Status: None   Collection Time: 03/06/22  5:52 PM   Specimen: Urine, Catheterized  Result Value Ref Range Status   Specimen Description   Final    URINE, CATHETERIZED Performed at [facility], [address], [place], [place] [address]    Special Requests   Final    NONE Performed at [facility], [address], [place], [place] [address]    Culture   Final    NO GROWTH Performed at [facility], [place] [address], [place], [place] [address]    Report Status 03/08/2022 FINAL  Final  Surgical pcr screen     Status: Abnormal   Collection Time: 03/10/22  4:42 PM   Specimen: Nasal Mucosa; Nasal Swab  Result Value Ref Range Status   MRSA, PCR POSITIVE (A) NEGATIVE Final    Comment: RESULT CALLED TO, READ BACK BY AND VERIFIED WITH: RN SIMON 03/10/22 @2210      Staphylococcus aureus POSITIVE (A) NEGATIVE Final    Comment: (NOTE) The Xpert SA Assay (FDA approved for NASAL specimens in patients 66 years of age and older), is one component of a comprehensive surveillance program. It is not intended to diagnose infection nor to guide or monitor treatment. Performed at [facility], [place] [address], [place], [place] [address]     SIGNED:   Charlynne Cousins, MD  [facility] 03/16/2022, 11:01 AM Pager   If 7PM-7AM, please contact night-coverage [email or web address] Password [email or web address]

## 2022-03-16 NOTE — Progress Notes (Signed)
Mobility Specialist: Progress Note   03/16/22 1046  Mobility  Activity Ambulated with assistance in hallway  Level of Assistance Minimal assist, patient does 75% or more  Assistive Device Front wheel walker  Distance Ambulated (ft) 300 ft  Activity Response Tolerated well  Mobility Referral Yes  $Mobility charge 1 Mobility   Pt received in the bed and agreeable to mobility. C/o abdominal pain requiring minA with bed mobility and contact guard during ambulation. No other c/o throughout. Pt back to bed after session with call bell and phone in reach. Bed alarm is on.   [place] Maria Dunlap Mobility Specialist Secure Chat Only

## 2022-03-16 NOTE — Progress Notes (Signed)
[facility] Progress Note  2 Days Post-Op  Subjective: CC-  Husband at bedside. Abdomen sore but no severe pain. Denies n/v. Tolerating diet. Worked with PT yesterday, recommending home health PT.  Objective: Vital signs in last 24 hours: Temp:  [98.3 F (36.8 C)-98.7 F (37.1 C)] 98.7 F (37.1 C) (10/24 0755) Pulse Rate:  [76-90] 76 (10/24 0755) Resp:  [16-18] 18 (10/24 0755) BP: (100-128)/(67-85) 128/85 (10/24 0755) SpO2:  [95 %-100 %] 95 % (10/24 0755) Last BM Date : 03/10/22  Intake/Output from previous day: No intake/output data recorded. Intake/Output this shift: No intake/output data recorded.  PE: Gen:  Alert, NAD, pleasant Abd: soft, ND, appropriately tender, lap incisions cdi  Lab Results:  No results for input(s): "WBC", "HGB", "HCT", "PLT" in the last 72 hours. BMET No results for input(s): "NA", "K", "CL", "CO2", "GLUCOSE", "BUN", "CREATININE", "CALCIUM" in the last 72 hours. PT/INR No results for input(s): "LABPROT", "INR" in the last 72 hours. CMP     Component Value Date/Time   NA 137 03/13/2022 0420   NA 133 (L) 02/17/2022 1253   K 4.6 03/13/2022 0420   CL 108 03/13/2022 0420   CO2 22 03/13/2022 0420   GLUCOSE 109 (H) 03/13/2022 0420   BUN <5 (L) 03/13/2022 0420   BUN 8 02/17/2022 1253   CREATININE 0.85 03/13/2022 0420   CREATININE 0.67 10/23/2012 1024   CALCIUM 8.1 (L) 03/13/2022 0420   PROT 4.1 (L) 03/12/2022 0323   PROT 6.4 02/17/2022 1253   ALBUMIN 1.8 (L) 03/12/2022 0323   ALBUMIN 3.4 (L) 02/17/2022 1253   AST 27 03/12/2022 0323   ALT 16 03/12/2022 0323   ALKPHOS 82 03/12/2022 0323   BILITOT 0.6 03/12/2022 0323   BILITOT 0.7 02/17/2022 1253   GFRNONAA >60 03/13/2022 0420   GFRNONAA >89 10/23/2012 1024   GFRAA 92 09/17/2013 0844   GFRAA >89 10/23/2012 1024   Lipase     Component Value Date/Time   LIPASE 47 03/12/2022 0323       Studies/Results: No results found.  Anti-infectives: Anti-infectives (From  admission, onward)    Start     Dose/Rate Route Frequency Ordered Stop   03/14/22 0900  vancomycin (VANCOCIN) IVPB 1000 mg/200 mL premix        1,000 mg 200 mL/hr over 60 Minutes Intravenous  Once 03/14/22 0812 03/14/22 0940   03/12/22 1300  ciprofloxacin (CIPRO) IVPB 400 mg  Status:  Discontinued        400 mg 200 mL/hr over 60 Minutes Intravenous  Once 03/12/22 1254 03/12/22 1320   03/07/22 0400  cefTRIAXone (ROCEPHIN) 1 g in sodium chloride 0.9 % 100 mL IVPB        1 g 200 mL/hr over 30 Minutes Intravenous Every 24 hours 03/06/22 2104 03/09/22 0346   03/06/22 2200  ceFEPIme (MAXIPIME) 2 g in sodium chloride 0.9 % 100 mL IVPB  Status:  Discontinued        2 g 200 mL/hr over 30 Minutes Intravenous Every 8 hours 03/06/22 1817 03/06/22 1822   03/06/22 2200  ceFEPIme (MAXIPIME) 2 g in sodium chloride 0.9 % 100 mL IVPB  Status:  Discontinued        2 g 200 mL/hr over 30 Minutes Intravenous Every 12 hours 03/06/22 1822 03/06/22 2104   03/06/22 1645  ceFEPIme (MAXIPIME) 2 g in sodium chloride 0.9 % 100 mL IVPB  Status:  Discontinued       See Hyperspace for full Linked Orders Report.  2 g 200 mL/hr over 30 Minutes Intravenous  Once 03/06/22 1640 03/06/22 1642   03/06/22 1645  metroNIDAZOLE (FLAGYL) IVPB 500 mg  Status:  Discontinued       See Hyperspace for full Linked Orders Report.   500 mg 100 mL/hr over 60 Minutes Intravenous  Once 03/06/22 1640 03/06/22 1642   03/06/22 1645  ceFEPIme (MAXIPIME) 2 g in sodium chloride 0.9 % 100 mL IVPB        2 g 200 mL/hr over 30 Minutes Intravenous  Once 03/06/22 1642 03/06/22 1741   03/06/22 1645  metroNIDAZOLE (FLAGYL) IVPB 500 mg        500 mg 100 mL/hr over 60 Minutes Intravenous  Once 03/06/22 1642 03/06/22 1933        Assessment/Plan Gallstone pancreatitis Choledocholithiasis - ERCP on 10/20 with stone extraction POD#2 s/p laparoscopic cholecystectomy 10/22 Dr. Grandville Silos - Doing well from surgical standpoint and is ok for discharge. I  sent rx for oxycodone to her pharmacy. Discharge instructions and follow up info on AVS.  ID - none currently VTE - SCDs, lovenox FEN - reg diet Foley - none     Memory impairment, prior diagnosis of Warnicke encephalopathy     LOS: 10 days    Wellington Hampshire, [facility] Surgery 03/16/2022, 10:13 AM Please see Amion for pager number during day hours 7:00am-4:30pm

## 2022-03-16 NOTE — Progress Notes (Signed)
Pt asked for vitals to be done at another time.  RN notified

## 2022-03-16 NOTE — TOC Transition Note (Signed)
Transition of Care [facility]) - CM/SW Discharge Note   Patient Details  Name: Maria Dunlap MRN: [id number] Date of Birth: 1951/04/29  [facility] CM/SW Contact:  Curlene Labrum, RN Phone Number: 03/16/2022, 10:14 AM   Clinical Narrative:    CM met with the patient and husband at the bedside to discuss transitions of care to home.  The patient is S/P Lap Cholecystectomy.  I discussed needs with the husband since the patient is poor historian.  The patient is active with [facility] - orders placed to continue services at the home - to be co-signed by the MD.  OP Resources provided for OP counseling since patient with recent history of ETOH use.  The patient will likely be discharged home today and husband will provide transportation to the home by car.   Final next level of care: Home w Home Health Services Barriers to Discharge: No Barriers Identified   Patient Goals and CMS Choice Patient states their goals for this hospitalization and ongoing recovery are:: To return home with husband CMS Medicare.gov Compare Post Acute Care list provided to:: Patient Represenative (must comment) (Patient's husband) Choice offered to / list presented to : Spouse  Discharge Placement                       Discharge Plan and Services   Discharge Planning Services: CM Consult Post Acute Care Choice: Home Health                    HH Arranged: RN, PT, OT [facility] Agency: [place] Date [place]: 03/16/22 Time [place]: 1012 Representative spoke with at [place]: Claiborne Billings, [place] with [place]  Social Determinants of Health (SDOH) Interventions     Readmission Risk Interventions    03/16/2022   10:13 AM  Readmission Risk Prevention Plan  Post Dischage Appt Complete  Medication Screening Complete  Transportation Screening Complete

## 2022-03-17 ENCOUNTER — Ambulatory Visit: Payer: Medicare Other | Admitting: Neurology

## 2022-03-17 ENCOUNTER — Ambulatory Visit: Payer: Medicare Other | Admitting: Family Medicine

## 2022-03-18 ENCOUNTER — Telehealth: Payer: Self-pay

## 2022-03-18 NOTE — Telephone Encounter (Signed)
Transition Care Management Follow-up Telephone Call Date of discharge and from where: 03/16/2022 [facility]  How have you been since you were released from the hospital? Patient is doing well  Any questions or concerns? No  Items Reviewed: Did the pt receive and understand the discharge instructions provided? Yes  Medications obtained and verified? Yes  Other? Yes  Any new allergies since your discharge? No  Dietary orders reviewed? Yes Do you have support at home? Yes   Home Care and Equipment/Supplies: Were home health services ordered? not applicable If so, what is the name of the agency? na  Has the agency set up a time to come to the patient's home? not applicable Were any new equipment or medical supplies ordered?  No What is the name of the medical supply agency? na Were you able to get the supplies/equipment? not applicable Do you have any questions related to the use of the equipment or supplies? No  Functional Questionnaire: (I = Independent and D = Dependent) ADLs: D  Bathing/Dressing- I  Meal Prep- D  Eating- I  Maintaining continence- I  Transferring/Ambulation- D -needs some help at times   Managing Meds- I  Follow up appointments reviewed:  PCP Hospital f/u appt confirmed?  Declines at this time - patient's husband states they will have daughter call to schedule  as needed [facility] f/u appt confirmed? Yes  Scheduled to see [facility] Surgery on 04/06/2022 @ 1030am. Are transportation arrangements needed? No  If their condition worsens, is the pt aware to call PCP or go to the Emergency Dept.? Yes Was the patient provided with contact information for the PCP's office or ED? Yes Was to pt encouraged to call back with questions or concerns? Yes

## 2022-03-19 DIAGNOSIS — E876 Hypokalemia: Secondary | ICD-10-CM | POA: Diagnosis not present

## 2022-03-19 DIAGNOSIS — D539 Nutritional anemia, unspecified: Secondary | ICD-10-CM | POA: Diagnosis not present

## 2022-03-19 DIAGNOSIS — M109 Gout, unspecified: Secondary | ICD-10-CM | POA: Diagnosis not present

## 2022-03-19 DIAGNOSIS — Z9181 History of falling: Secondary | ICD-10-CM | POA: Diagnosis not present

## 2022-03-19 DIAGNOSIS — R627 Adult failure to thrive: Secondary | ICD-10-CM | POA: Diagnosis not present

## 2022-03-19 DIAGNOSIS — Z87891 Personal history of nicotine dependence: Secondary | ICD-10-CM | POA: Diagnosis not present

## 2022-03-19 DIAGNOSIS — E785 Hyperlipidemia, unspecified: Secondary | ICD-10-CM | POA: Diagnosis not present

## 2022-03-19 DIAGNOSIS — E538 Deficiency of other specified B group vitamins: Secondary | ICD-10-CM | POA: Diagnosis not present

## 2022-03-19 DIAGNOSIS — R7303 Prediabetes: Secondary | ICD-10-CM | POA: Diagnosis not present

## 2022-03-19 DIAGNOSIS — K219 Gastro-esophageal reflux disease without esophagitis: Secondary | ICD-10-CM | POA: Diagnosis not present

## 2022-03-19 DIAGNOSIS — R1310 Dysphagia, unspecified: Secondary | ICD-10-CM | POA: Diagnosis not present

## 2022-03-19 DIAGNOSIS — I10 Essential (primary) hypertension: Secondary | ICD-10-CM | POA: Diagnosis not present

## 2022-03-22 DIAGNOSIS — R627 Adult failure to thrive: Secondary | ICD-10-CM | POA: Diagnosis not present

## 2022-03-22 DIAGNOSIS — Z87891 Personal history of nicotine dependence: Secondary | ICD-10-CM | POA: Diagnosis not present

## 2022-03-22 DIAGNOSIS — I10 Essential (primary) hypertension: Secondary | ICD-10-CM | POA: Diagnosis not present

## 2022-03-22 DIAGNOSIS — M109 Gout, unspecified: Secondary | ICD-10-CM | POA: Diagnosis not present

## 2022-03-22 DIAGNOSIS — R1310 Dysphagia, unspecified: Secondary | ICD-10-CM | POA: Diagnosis not present

## 2022-03-22 DIAGNOSIS — K219 Gastro-esophageal reflux disease without esophagitis: Secondary | ICD-10-CM | POA: Diagnosis not present

## 2022-03-22 DIAGNOSIS — D539 Nutritional anemia, unspecified: Secondary | ICD-10-CM | POA: Diagnosis not present

## 2022-03-22 DIAGNOSIS — E785 Hyperlipidemia, unspecified: Secondary | ICD-10-CM | POA: Diagnosis not present

## 2022-03-22 DIAGNOSIS — E876 Hypokalemia: Secondary | ICD-10-CM | POA: Diagnosis not present

## 2022-03-22 DIAGNOSIS — R7303 Prediabetes: Secondary | ICD-10-CM | POA: Diagnosis not present

## 2022-03-22 DIAGNOSIS — Z9181 History of falling: Secondary | ICD-10-CM | POA: Diagnosis not present

## 2022-03-22 DIAGNOSIS — E538 Deficiency of other specified B group vitamins: Secondary | ICD-10-CM | POA: Diagnosis not present

## 2022-03-23 DIAGNOSIS — Z9181 History of falling: Secondary | ICD-10-CM | POA: Diagnosis not present

## 2022-03-23 DIAGNOSIS — I10 Essential (primary) hypertension: Secondary | ICD-10-CM | POA: Diagnosis not present

## 2022-03-23 DIAGNOSIS — Z87891 Personal history of nicotine dependence: Secondary | ICD-10-CM | POA: Diagnosis not present

## 2022-03-23 DIAGNOSIS — R1310 Dysphagia, unspecified: Secondary | ICD-10-CM | POA: Diagnosis not present

## 2022-03-23 DIAGNOSIS — D539 Nutritional anemia, unspecified: Secondary | ICD-10-CM | POA: Diagnosis not present

## 2022-03-23 DIAGNOSIS — E538 Deficiency of other specified B group vitamins: Secondary | ICD-10-CM | POA: Diagnosis not present

## 2022-03-23 DIAGNOSIS — E785 Hyperlipidemia, unspecified: Secondary | ICD-10-CM | POA: Diagnosis not present

## 2022-03-23 DIAGNOSIS — E876 Hypokalemia: Secondary | ICD-10-CM | POA: Diagnosis not present

## 2022-03-23 DIAGNOSIS — R627 Adult failure to thrive: Secondary | ICD-10-CM | POA: Diagnosis not present

## 2022-03-23 DIAGNOSIS — K219 Gastro-esophageal reflux disease without esophagitis: Secondary | ICD-10-CM | POA: Diagnosis not present

## 2022-03-23 DIAGNOSIS — M109 Gout, unspecified: Secondary | ICD-10-CM | POA: Diagnosis not present

## 2022-03-23 DIAGNOSIS — R7303 Prediabetes: Secondary | ICD-10-CM | POA: Diagnosis not present

## 2022-03-25 ENCOUNTER — Encounter: Payer: Self-pay | Admitting: Family Medicine

## 2022-03-25 ENCOUNTER — Ambulatory Visit (INDEPENDENT_AMBULATORY_CARE_PROVIDER_SITE_OTHER): Payer: Medicare Other | Admitting: Family Medicine

## 2022-03-25 VITALS — BP 146/94 | HR 94 | Temp 96.0°F | Ht 60.98 in | Wt 113.8 lb

## 2022-03-25 DIAGNOSIS — F1010 Alcohol abuse, uncomplicated: Secondary | ICD-10-CM | POA: Diagnosis not present

## 2022-03-25 DIAGNOSIS — R110 Nausea: Secondary | ICD-10-CM | POA: Diagnosis not present

## 2022-03-25 DIAGNOSIS — K219 Gastro-esophageal reflux disease without esophagitis: Secondary | ICD-10-CM | POA: Diagnosis not present

## 2022-03-25 DIAGNOSIS — R9431 Abnormal electrocardiogram [ECG] [EKG]: Secondary | ICD-10-CM

## 2022-03-25 DIAGNOSIS — R197 Diarrhea, unspecified: Secondary | ICD-10-CM | POA: Diagnosis not present

## 2022-03-25 DIAGNOSIS — D510 Vitamin B12 deficiency anemia due to intrinsic factor deficiency: Secondary | ICD-10-CM

## 2022-03-25 MED ORDER — FAMOTIDINE 20 MG PO TABS: 20.0000 mg | ORAL_TABLET | Freq: Every day | ORAL | 6 refills | Status: AC

## 2022-03-25 MED ORDER — ONDANSETRON HCL 4 MG PO TABS: 4.0000 mg | ORAL_TABLET | Freq: Three times a day (TID) | ORAL | 0 refills | Status: AC | PRN

## 2022-03-25 NOTE — Progress Notes (Signed)
Subjective:  Patient ID: Maria Dunlap, female    DOB: 1951/02/24, 71 y.o.   MRN: [id number]  Patient Care Team: Baruch Gouty, FNP as PCP - General (Family Medicine) Satira Sark, MD as PCP - Cardiology (Cardiology)   Chief Complaint:  Diarrhea (On and off since she had gallbladder surgery 03/14/22) and Vomiting (On and off since gallbladder surgery )   HPI: Maria Dunlap is a 71 y.o. female presenting on 03/25/2022 for Diarrhea (On and off since she had gallbladder surgery 03/14/22) and Vomiting (On and off since gallbladder surgery )   Pt presents today for follow up after cholecystectomy. Spouse states she has had intermittent nausea, vomiting, and diarrhea since the surgery. Denies pain. Has not followed up with surgeon. Denies fever, chills, weakness, or increased confusion. Spouse states pt has not been drinking ETOH. Confusion remains present but not new or worsening.   Diarrhea  This is a new problem. The problem has been waxing and waning. Associated symptoms include increased flatus and vomiting. Pertinent negatives include no abdominal pain, arthralgias, bloating, chills, coughing, fever, headaches, myalgias, sweats, URI or weight loss. Exacerbated by: eating. Risk factors include recent hospitalization. She has tried nothing for the symptoms. Her past medical history is significant for a recent abdominal surgery.    Relevant past medical, surgical, family, and social history reviewed and updated as indicated.  Allergies and medications reviewed and updated. Data reviewed: Chart in Epic.   Past Medical History:  Diagnosis Date   Alcohol abuse    Dysphagia    Eczema    Essential hypertension    GERD (gastroesophageal reflux disease)    Gout    Hyperlipidemia    Liver cirrhosis (HCC)    Menopause    Metabolic encephalopathy    Prediabetes    Statin intolerance     Past Surgical History:  Procedure Laterality Date   ABDOMINAL HYSTERECTOMY      ABDOMINAL SURGERY     CHOLECYSTECTOMY N/A 03/14/2022   Procedure: LAPAROSCOPIC CHOLECYSTECTOMY;  Surgeon: Georganna Skeans, MD;  Location: [place];  Service: General;  Laterality: N/A;   ENDOSCOPIC RETROGRADE CHOLANGIOPANCREATOGRAPHY (ERCP) WITH PROPOFOL N/A 03/12/2022   Procedure: ENDOSCOPIC RETROGRADE CHOLANGIOPANCREATOGRAPHY (ERCP) WITH PROPOFOL;  Surgeon: Ladene Artist, MD;  Location: [place];  Service: Gastroenterology;  Laterality: N/A;   REMOVAL OF STONES  03/12/2022   Procedure: REMOVAL OF STONES;  Surgeon: Ladene Artist, MD;  Location: [place];  Service: Gastroenterology;;   Joan Mayans  03/12/2022   Procedure: SPHINCTEROTOMY;  Surgeon: Ladene Artist, MD;  Location: [facility] ENDOSCOPY;  Service: Gastroenterology;;    Social History   Socioeconomic History   Marital status: Married    Spouse name: Not on file   Number of children: Not on file   Years of education: Not on file   Highest education level: Not on file  Occupational History   Not on file  Tobacco Use   Smoking status: Former    Types: Cigarettes    Quit date: 05/24/1992    Years since quitting: 29.8   Smokeless tobacco: Not on file  Substance and Sexual Activity   Alcohol use: Yes    Comment: Occasional   Drug use: No   Sexual activity: Not on file  Other Topics Concern   Not on file  Social History Narrative   Not on file   Social Determinants of Health   Financial Resource Strain: Not on file  Food Insecurity: No Food Insecurity (  03/07/2022)   Hunger Vital Sign    Worried About Running Out of Food in the Last Year: Never true    [place] in the Last Year: Never true  Transportation Needs: No Transportation Needs (03/07/2022)   PRAPARE - Hydrologist (Medical): No    Lack of Transportation (Non-Medical): No  Physical Activity: Not on file  Stress: Not on file  Social Connections: Not on file  Intimate Partner Violence: Not At Risk (03/07/2022)    Humiliation, Afraid, Rape, and Kick questionnaire    Fear of Current or Ex-Partner: No    Emotionally Abused: No    Physically Abused: No    Sexually Abused: No    Outpatient Encounter Medications as of 03/25/2022  Medication Sig   Ascorbic Acid (VITAMIN C WITH ROSE HIPS) 500 MG tablet Take 500 mg by mouth daily.   Chelated Zinc 50 MG TABS Take 50 mg by mouth daily.   famotidine (PEPCID) 20 MG tablet Take 1 tablet (20 mg total) by mouth daily.   folic acid (FOLVITE) 106 MCG tablet Take 400 mcg by mouth daily.   magnesium oxide (MAG-OX) 400 MG tablet Take 400 mg by mouth daily.   ondansetron (ZOFRAN) 4 MG tablet Take 1 tablet (4 mg total) by mouth every 8 (eight) hours as needed for nausea or vomiting.   oxyCODONE (OXY IR/ROXICODONE) 5 MG immediate release tablet Take 1 tablet (5 mg total) by mouth every 6 (six) hours as needed for severe pain.   potassium chloride SA (KLOR-CON M) 20 MEQ tablet Take 1 tablet (20 mEq total) by mouth daily.   thiamine (VITAMIN B1) 100 MG tablet Take 100 mg by mouth daily.   cyanocobalamin (VITAMIN B12) 500 MCG tablet Take 500 mcg by mouth every other day. (Patient not taking: Reported on 03/25/2022)   No facility-administered encounter medications on file as of 03/25/2022.    Allergies  Allergen Reactions   Amoxicillin Swelling    Neck swell Throat swelling   Allopurinol Other (See Comments)    Elevated LFT's Elevated LFT's   Macrolides And Ketolides    Mycinette [Phenol]    Nitrofurantoin Other (See Comments)   Simvastatin Other (See Comments)    Elevated LFT's  Elevated LFT's   Rosuvastatin Rash    dizziness   Triamcinolone Rash and Other (See Comments)   Triamcinolone Acetonide Rash    Review of Systems  Constitutional:  Positive for appetite change. Negative for activity change, chills, diaphoresis, fatigue, fever, unexpected weight change and weight loss.  Respiratory:  Negative for cough and shortness of breath.   Cardiovascular:   Negative for chest pain, palpitations and leg swelling.  Gastrointestinal:  Positive for diarrhea, flatus, nausea and vomiting. Negative for abdominal distention, abdominal pain, anal bleeding, bloating, blood in stool, constipation and rectal pain.  Genitourinary:  Negative for decreased urine volume and difficulty urinating.  Musculoskeletal:  Negative for arthralgias and myalgias.  Neurological:  Negative for dizziness, syncope, weakness and headaches.  Psychiatric/Behavioral:  Positive for agitation, confusion and sleep disturbance.   All other systems reviewed and are negative.       Objective:  BP (!) 146/94   Pulse 94   Temp (!) 96 F (35.6 C) (Temporal)   Ht 5' 0.98" (1.549 m)   Wt 113 lb 12.8 oz (51.6 kg)   SpO2 98%   BMI 21.52 kg/m    Wt Readings from Last 3 Encounters:  03/25/22 113 lb 12.8 oz (51.6  kg)  03/14/22 113 lb 8 oz (51.5 kg)  02/17/22 113 lb (51.3 kg)    Physical Exam Vitals and nursing note reviewed.  Constitutional:      General: She is not in acute distress.    Appearance: Normal appearance. She is ill-appearing (chronically). She is not toxic-appearing or diaphoretic.  HENT:     Head: Normocephalic and atraumatic.     Mouth/Throat:     Mouth: Mucous membranes are moist.  Eyes:     Pupils: Pupils are equal, round, and reactive to light.  Cardiovascular:     Rate and Rhythm: Normal rate and regular rhythm.     Heart sounds: Normal heart sounds. No murmur heard.    No friction rub. No gallop.  Pulmonary:     Effort: Pulmonary effort is normal.     Breath sounds: Normal breath sounds.  Abdominal:     General: Bowel sounds are normal. There is no distension.     Palpations: Abdomen is soft.     Tenderness: There is no abdominal tenderness. There is no guarding.  Musculoskeletal:     Right lower leg: No edema.     Left lower leg: No edema.  Skin:    General: Skin is warm and dry.     Capillary Refill: Capillary refill takes less than 2  seconds.     Coloration: Skin is not jaundiced.  Neurological:     Mental Status: She is alert. Mental status is at baseline. She is disoriented.  Psychiatric:        Attention and Perception: She is inattentive.        Mood and Affect: Affect is angry.        Speech: Speech is tangential.        Behavior: Behavior is agitated.        Cognition and Memory: Cognition is impaired. Memory is impaired.     Results for orders placed or performed during the hospital encounter of 03/06/22  Culture, blood (single)   Specimen: BLOOD  Result Value Ref Range   Specimen Description      BLOOD RIGHT ANTECUBITAL BOTTLES DRAWN AEROBIC AND ANAEROBIC   Special Requests      Blood Culture results may not be optimal due to an excessive volume of blood received in culture bottles   Culture      NO GROWTH 5 DAYS Performed at [facility], [address] [place], [place] [address]    Report Status 03/11/2022 FINAL   Urine Culture   Specimen: Urine, Catheterized  Result Value Ref Range   Specimen Description      URINE, CATHETERIZED Performed at [facility], [address], [place], [place] [address]    Special Requests      NONE Performed at [facility], [address] [place], [place] [address]    Culture      NO GROWTH Performed at [facility], [place] [address], [place], [place] [address]    Report Status 03/08/2022 FINAL   Surgical pcr screen   Specimen: Nasal Mucosa; Nasal Swab  Result Value Ref Range   MRSA, PCR POSITIVE (A) NEGATIVE   Staphylococcus aureus POSITIVE (A) NEGATIVE  Comprehensive metabolic panel  Result Value Ref Range   Sodium 136 135 - 145 mmol/L   Potassium 3.2 (L) 3.5 - 5.1 mmol/L   Chloride 106 98 - 111 mmol/L   CO2 18 (L) 22 - 32 mmol/L   Glucose, Bld 150 (H) 70 - 99 mg/dL  BUN 11 8 - 23 mg/dL   Creatinine, Ser 0.78 0.44 - 1.00 mg/dL   Calcium 9.0 8.9 - 10.3 mg/dL   Total Protein 6.1 (L) 6.5 - 8.1 g/dL   Albumin 2.7 (L) 3.5 - 5.0 g/dL   AST 130 (H)  15 - 41 U/L   ALT 31 0 - 44 U/L   Alkaline Phosphatase 178 (H) 38 - 126 U/L   Total Bilirubin 2.0 (H) 0.3 - 1.2 mg/dL   GFR, Estimated >60 >60 mL/min   Anion gap 12 5 - 15  Lipase, blood  Result Value Ref Range   Lipase 1,643 (H) 11 - 51 U/L  CBC with Differential  Result Value Ref Range   WBC 10.4 4.0 - 10.5 K/uL   RBC 3.38 (L) 3.87 - 5.11 MIL/uL   Hemoglobin 10.5 (L) 12.0 - 15.0 g/dL   HCT 32.3 (L) 36.0 - 46.0 %   MCV 95.6 80.0 - 100.0 fL   MCH 31.1 26.0 - 34.0 pg   MCHC 32.5 30.0 - 36.0 g/dL   RDW 14.4 11.5 - 15.5 %   Platelets 263 150 - 400 K/uL   nRBC 0.0 0.0 - 0.2 %   Neutrophils Relative % 78 %   Neutro Abs 8.0 (H) 1.7 - 7.7 K/uL   Lymphocytes Relative 17 %   Lymphs Abs 1.8 0.7 - 4.0 K/uL   Monocytes Relative 5 %   Monocytes Absolute 0.5 0.1 - 1.0 K/uL   Eosinophils Relative 0 %   Eosinophils Absolute 0.0 0.0 - 0.5 K/uL   Basophils Relative 0 %   Basophils Absolute 0.0 0.0 - 0.1 K/uL   Immature Granulocytes 0 %   Abs Immature Granulocytes 0.03 0.00 - 0.07 K/uL  Lactic acid, plasma  Result Value Ref Range   Lactic Acid, Venous 3.6 (HH) 0.5 - 1.9 mmol/L  Lactic acid, plasma  Result Value Ref Range   Lactic Acid, Venous 3.5 (HH) 0.5 - 1.9 mmol/L  Ethanol  Result Value Ref Range   Alcohol, Ethyl (B) <10 <10 mg/dL  Urinalysis, Routine w reflex microscopic Urine, In & Out Cath  Result Value Ref Range   Color, Urine YELLOW YELLOW   APPearance HAZY (A) CLEAR   Specific Gravity, Urine 1.016 1.005 - 1.030   pH 5.0 5.0 - 8.0   Glucose, UA NEGATIVE NEGATIVE mg/dL   Hgb urine dipstick NEGATIVE NEGATIVE   Bilirubin Urine NEGATIVE NEGATIVE   Ketones, ur 5 (A) NEGATIVE mg/dL   Protein, ur NEGATIVE NEGATIVE mg/dL   Nitrite NEGATIVE NEGATIVE   Leukocytes,Ua LARGE (A) NEGATIVE   RBC / HPF 0-5 0 - 5 RBC/hpf   WBC, UA >50 (H) 0 - 5 WBC/hpf   Bacteria, UA RARE (A) NONE SEEN   Squamous Epithelial / LPF 0-5 0 - 5   Mucus PRESENT    Budding Yeast PRESENT    Hyaline Casts,  UA PRESENT    Ca Oxalate Crys, UA PRESENT   Magnesium  Result Value Ref Range   Magnesium 1.5 (L) 1.7 - 2.4 mg/dL  Lactic acid, plasma  Result Value Ref Range   Lactic Acid, Venous 1.9 0.5 - 1.9 mmol/L  Protime-INR  Result Value Ref Range   Prothrombin Time 12.8 11.4 - 15.2 seconds   INR 1.0 0.8 - 1.2  Comprehensive metabolic panel  Result Value Ref Range   Sodium 136 135 - 145 mmol/L   Potassium 3.9 3.5 - 5.1 mmol/L   Chloride 109 98 - 111 mmol/L  CO2 20 (L) 22 - 32 mmol/L   Glucose, Bld 76 70 - 99 mg/dL   BUN 11 8 - 23 mg/dL   Creatinine, Ser 0.59 0.44 - 1.00 mg/dL   Calcium 8.4 (L) 8.9 - 10.3 mg/dL   Total Protein 4.9 (L) 6.5 - 8.1 g/dL   Albumin 2.2 (L) 3.5 - 5.0 g/dL   AST 62 (H) 15 - 41 U/L   ALT 27 0 - 44 U/L   Alkaline Phosphatase 146 (H) 38 - 126 U/L   Total Bilirubin 1.5 (H) 0.3 - 1.2 mg/dL   GFR, Estimated >60 >60 mL/min   Anion gap 7 5 - 15  CBC  Result Value Ref Range   WBC 7.1 4.0 - 10.5 K/uL   RBC 2.88 (L) 3.87 - 5.11 MIL/uL   Hemoglobin 8.8 (L) 12.0 - 15.0 g/dL   HCT 28.3 (L) 36.0 - 46.0 %   MCV 98.3 80.0 - 100.0 fL   MCH 30.6 26.0 - 34.0 pg   MCHC 31.1 30.0 - 36.0 g/dL   RDW 14.4 11.5 - 15.5 %   Platelets 200 150 - 400 K/uL   nRBC 0.3 (H) 0.0 - 0.2 %  Magnesium  Result Value Ref Range   Magnesium 2.2 1.7 - 2.4 mg/dL  Hemoglobin A1c  Result Value Ref Range   Hgb A1c MFr Bld 4.0 (L) 4.8 - 5.6 %   Mean Plasma Glucose 68.1 mg/dL  Glucose, capillary  Result Value Ref Range   Glucose-Capillary 91 70 - 99 mg/dL  Lipase, blood  Result Value Ref Range   Lipase 261 (H) 11 - 51 U/L  Lipase, blood  Result Value Ref Range   Lipase 54 (H) 11 - 51 U/L  Comprehensive metabolic panel  Result Value Ref Range   Sodium 136 135 - 145 mmol/L   Potassium 3.3 (L) 3.5 - 5.1 mmol/L   Chloride 109 98 - 111 mmol/L   CO2 22 22 - 32 mmol/L   Glucose, Bld 72 70 - 99 mg/dL   BUN 8 8 - 23 mg/dL   Creatinine, Ser 0.60 0.44 - 1.00 mg/dL   Calcium 7.9 (L) 8.9 - 10.3  mg/dL   Total Protein 4.0 (L) 6.5 - 8.1 g/dL   Albumin 1.8 (L) 3.5 - 5.0 g/dL   AST 34 15 - 41 U/L   ALT 19 0 - 44 U/L   Alkaline Phosphatase 107 38 - 126 U/L   Total Bilirubin 1.2 0.3 - 1.2 mg/dL   GFR, Estimated >60 >60 mL/min   Anion gap 5 5 - 15  CBC  Result Value Ref Range   WBC 4.9 4.0 - 10.5 K/uL   RBC 2.34 (L) 3.87 - 5.11 MIL/uL   Hemoglobin 7.2 (L) 12.0 - 15.0 g/dL   HCT 22.8 (L) 36.0 - 46.0 %   MCV 97.4 80.0 - 100.0 fL   MCH 30.8 26.0 - 34.0 pg   MCHC 31.6 30.0 - 36.0 g/dL   RDW 14.4 11.5 - 15.5 %   Platelets 164 150 - 400 K/uL   nRBC 0.0 0.0 - 0.2 %  Ammonia  Result Value Ref Range   Ammonia <10 9 - 35 umol/L  Protime-INR  Result Value Ref Range   Prothrombin Time 14.8 11.4 - 15.2 seconds   INR 1.2 0.8 - 1.2  Vitamin B12  Result Value Ref Range   Vitamin B-12 1,222 (H) 180 - 914 pg/mL  Folate  Result Value Ref Range   Folate 11.2 >5.9  ng/mL  Iron and TIBC  Result Value Ref Range   Iron 55 28 - 170 ug/dL   TIBC 108 (L) 250 - 450 ug/dL   Saturation Ratios 51 (H) 10.4 - 31.8 %   UIBC 53 ug/dL  Ferritin  Result Value Ref Range   Ferritin 676 (H) 11 - 307 ng/mL  Reticulocytes  Result Value Ref Range   Retic Ct Pct 3.2 (H) 0.4 - 3.1 %   RBC. 2.67 (L) 3.87 - 5.11 MIL/uL   Retic Count, Absolute 86.5 19.0 - 186.0 K/uL   Immature Retic Fract 17.3 (H) 2.3 - 15.9 %  Comprehensive metabolic panel  Result Value Ref Range   Sodium 137 135 - 145 mmol/L   Potassium 3.1 (L) 3.5 - 5.1 mmol/L   Chloride 108 98 - 111 mmol/L   CO2 24 22 - 32 mmol/L   Glucose, Bld 75 70 - 99 mg/dL   BUN 6 (L) 8 - 23 mg/dL   Creatinine, Ser 0.50 0.44 - 1.00 mg/dL   Calcium 8.0 (L) 8.9 - 10.3 mg/dL   Total Protein 4.2 (L) 6.5 - 8.1 g/dL   Albumin 1.8 (L) 3.5 - 5.0 g/dL   AST 30 15 - 41 U/L   ALT 18 0 - 44 U/L   Alkaline Phosphatase 106 38 - 126 U/L   Total Bilirubin 0.9 0.3 - 1.2 mg/dL   GFR, Estimated >60 >60 mL/min   Anion gap 5 5 - 15  CBC  Result Value Ref Range   WBC 4.0  4.0 - 10.5 K/uL   RBC 2.42 (L) 3.87 - 5.11 MIL/uL   Hemoglobin 7.5 (L) 12.0 - 15.0 g/dL   HCT 23.6 (L) 36.0 - 46.0 %   MCV 97.5 80.0 - 100.0 fL   MCH 31.0 26.0 - 34.0 pg   MCHC 31.8 30.0 - 36.0 g/dL   RDW 14.5 11.5 - 15.5 %   Platelets 172 150 - 400 K/uL   nRBC 0.0 0.0 - 0.2 %  Comprehensive metabolic panel  Result Value Ref Range   Sodium 138 135 - 145 mmol/L   Potassium 3.3 (L) 3.5 - 5.1 mmol/L   Chloride 107 98 - 111 mmol/L   CO2 24 22 - 32 mmol/L   Glucose, Bld 86 70 - 99 mg/dL   BUN <5 (L) 8 - 23 mg/dL   Creatinine, Ser 0.61 0.44 - 1.00 mg/dL   Calcium 8.1 (L) 8.9 - 10.3 mg/dL   Total Protein 3.9 (L) 6.5 - 8.1 g/dL   Albumin 1.7 (L) 3.5 - 5.0 g/dL   AST 28 15 - 41 U/L   ALT 16 0 - 44 U/L   Alkaline Phosphatase 96 38 - 126 U/L   Total Bilirubin 0.7 0.3 - 1.2 mg/dL   GFR, Estimated >60 >60 mL/min   Anion gap 7 5 - 15  Comprehensive metabolic panel  Result Value Ref Range   Sodium 139 135 - 145 mmol/L   Potassium 4.0 3.5 - 5.1 mmol/L   Chloride 111 98 - 111 mmol/L   CO2 22 22 - 32 mmol/L   Glucose, Bld 79 70 - 99 mg/dL   BUN <5 (L) 8 - 23 mg/dL   Creatinine, Ser 0.68 0.44 - 1.00 mg/dL   Calcium 8.3 (L) 8.9 - 10.3 mg/dL   Total Protein 4.1 (L) 6.5 - 8.1 g/dL   Albumin 1.8 (L) 3.5 - 5.0 g/dL   AST 29 15 - 41 U/L   ALT 16 0 - 44  U/L   Alkaline Phosphatase 90 38 - 126 U/L   Total Bilirubin 1.0 0.3 - 1.2 mg/dL   GFR, Estimated >60 >60 mL/min   Anion gap 6 5 - 15  Lipase, blood  Result Value Ref Range   Lipase 43 11 - 51 U/L  CBC  Result Value Ref Range   WBC 4.4 4.0 - 10.5 K/uL   RBC 2.49 (L) 3.87 - 5.11 MIL/uL   Hemoglobin 7.6 (L) 12.0 - 15.0 g/dL   HCT 23.7 (L) 36.0 - 46.0 %   MCV 95.2 80.0 - 100.0 fL   MCH 30.5 26.0 - 34.0 pg   MCHC 32.1 30.0 - 36.0 g/dL   RDW 14.4 11.5 - 15.5 %   Platelets 198 150 - 400 K/uL   nRBC 0.0 0.0 - 0.2 %  Comprehensive metabolic panel  Result Value Ref Range   Sodium 139 135 - 145 mmol/L   Potassium 3.4 (L) 3.5 - 5.1 mmol/L    Chloride 106 98 - 111 mmol/L   CO2 24 22 - 32 mmol/L   Glucose, Bld 89 70 - 99 mg/dL   BUN <5 (L) 8 - 23 mg/dL   Creatinine, Ser 0.69 0.44 - 1.00 mg/dL   Calcium 8.2 (L) 8.9 - 10.3 mg/dL   Total Protein 4.1 (L) 6.5 - 8.1 g/dL   Albumin 1.8 (L) 3.5 - 5.0 g/dL   AST 27 15 - 41 U/L   ALT 16 0 - 44 U/L   Alkaline Phosphatase 82 38 - 126 U/L   Total Bilirubin 0.6 0.3 - 1.2 mg/dL   GFR, Estimated >60 >60 mL/min   Anion gap 9 5 - 15  CBC  Result Value Ref Range   WBC 4.4 4.0 - 10.5 K/uL   RBC 2.33 (L) 3.87 - 5.11 MIL/uL   Hemoglobin 7.3 (L) 12.0 - 15.0 g/dL   HCT 22.0 (L) 36.0 - 46.0 %   MCV 94.4 80.0 - 100.0 fL   MCH 31.3 26.0 - 34.0 pg   MCHC 33.2 30.0 - 36.0 g/dL   RDW 14.6 11.5 - 15.5 %   Platelets 182 150 - 400 K/uL   nRBC 0.0 0.0 - 0.2 %  Lipase, blood  Result Value Ref Range   Lipase 47 11 - 51 U/L  Basic metabolic panel  Result Value Ref Range   Sodium 137 135 - 145 mmol/L   Potassium 4.6 3.5 - 5.1 mmol/L   Chloride 108 98 - 111 mmol/L   CO2 22 22 - 32 mmol/L   Glucose, Bld 109 (H) 70 - 99 mg/dL   BUN <5 (L) 8 - 23 mg/dL   Creatinine, Ser 0.85 0.44 - 1.00 mg/dL   Calcium 8.1 (L) 8.9 - 10.3 mg/dL   GFR, Estimated >60 >60 mL/min   Anion gap 7 5 - 15  Surgical pathology  Result Value Ref Range   SURGICAL PATHOLOGY      SURGICAL PATHOLOGY CASE: [id number] PATIENT: Devona Coe Surgical Pathology Report     Clinical History: cholecystitis (cm)     FINAL MICROSCOPIC DIAGNOSIS:  A. GALLBLADDER, CHOLECYSTECTOMY: - Chronic cholecystitis with focal cholesterolosis. - Cholelithiasis. - Benign, attached hepatic parenchyma with steatosis. - Negative for malignancy.   GROSS DESCRIPTION:  Size/?Intact: Received fresh is a 7.5 x 2.8 x 1.5 cm disrupted gallbladder Serosal surface: Pink-tan and smooth to minimally roughened Mucosa/Wall: Green and velvety without distinct lesions and a wall thickness of 0.2 cm Contents: Green viscous bile and multiple  irregularly-shaped black,  smooth calculi ranging from 0.1 to 0.7 cm Cystic duct: Probed and found to be minimally obstructed by the previously mentioned calculi Block Summary: Representative wall and the cystic duct margin are submitted in 1 block. Amparo Bristol, 03/15/2022)     Final Diagnosis performed by Maureen Ralphs, MD.   Gladys Damme ctronically signed 03/16/2022 Technical and / or Professional components performed at [employer]. [facility], [place] [address] [place], [place] [address].  Immunohistochemistry Technical component (if applicable) was performed at [facility]. [address] [place], [place], [place] [address].   IMMUNOHISTOCHEMISTRY DISCLAIMER (if applicable): Some of these immunohistochemical stains may have been developed and the performance characteristics determine by [facility]. Some may not have been cleared or approved by the U.S. Food and Drug Administration. The FDA has determined that such clearance or approval is not necessary. This test is used for clinical purposes. It should not be regarded as investigational or for research. This laboratory is certified under the [place] (CLIA-88) as qualified to perform high complexity clinical laboratory testing.  The controls stained appropriately.      EKG: SR 86, QT has returned to normal at 368 ms, PR 134 ms, nonspecific ST-T changes that were present on previous EKG. No acute findings. Monia Pouch, FNP-C  Pertinent labs & imaging results that were available during my care of the patient were reviewed by me and considered in my medical decision making.  Assessment & Plan:  Adoria was seen today for diarrhea and vomiting.  Diagnoses and all orders for this visit:  Nausea Diarrhea in adult patient This has been intermittent since GB surgery. Will check labs today. Aware to make follow up with surgeon for reevaluation. EKG completed today  and Prolonged QT has resolved. This was likely due to electrolyte disturbances from alcoholism. Will give very short course of Zofran for nausea.  -     Anemia Profile B -     CMP14+EGFR -     Folate -     Magnesium -     Vitamin B1 -     ondansetron (ZOFRAN) 4 MG tablet; Take 1 tablet (4 mg total) by mouth every 8 (eight) hours as needed for nausea or vomiting.   ETOH abuse Spouse denies ETOH use. Will recheck labs today. -     Anemia Profile B -     CMP14+EGFR -     Folate -     Magnesium -     Vitamin B1  Pernicious anemia Will repeat labs today. Denies abnormal bleeding of bruising.  -     Anemia Profile B -     CMP14+EGFR -     Folate -     Magnesium -     Vitamin B1  Prolonged QT interval - Resolved Resolved on EKG today. Likely from acute illness and electrolyte disturbances from alcoholism. Will repeat labs today.  -     Anemia Profile B -     CMP14+EGFR -     EKG 12-Lead -     Folate -     Magnesium -     Vitamin B1  Gastroesophageal reflux disease without esophagitis No red flags present. Diet discussed. Avoid fried, spicy, fatty, greasy, and acidic foods. Avoid caffeine, nicotine, and alcohol. Do not eat 2-3 hours before bedtime and stay upright for at least 1-2 hours after eating. Eat small frequent meals. Avoid NSAID's like motrin and aleve. Medications as prescribed. Report any new  or worsening symptoms. Follow up as discussed or sooner if needed.   -     famotidine (PEPCID) 20 MG tablet; Take 1 tablet (20 mg total) by mouth daily.     Continue all other maintenance medications.  Follow up plan: Return if symptoms worsen or fail to improve.   Continue healthy lifestyle choices, including diet (rich in fruits, vegetables, and lean proteins, and low in salt and simple carbohydrates) and exercise (at least 30 minutes of moderate physical activity daily).  Educational handout given for GB diet  The above assessment and management plan was discussed with the  patient. The patient verbalized understanding of and has agreed to the management plan. Patient is aware to call the clinic if they develop any new symptoms or if symptoms persist or worsen. Patient is aware when to return to the clinic for a follow-up visit. Patient educated on when it is appropriate to go to the emergency department.   Monia Pouch, FNP-C [facility] [phone]

## 2022-03-26 ENCOUNTER — Ambulatory Visit (INDEPENDENT_AMBULATORY_CARE_PROVIDER_SITE_OTHER): Payer: Medicare Other

## 2022-03-26 DIAGNOSIS — K219 Gastro-esophageal reflux disease without esophagitis: Secondary | ICD-10-CM | POA: Diagnosis not present

## 2022-03-26 DIAGNOSIS — R627 Adult failure to thrive: Secondary | ICD-10-CM

## 2022-03-26 DIAGNOSIS — M109 Gout, unspecified: Secondary | ICD-10-CM | POA: Diagnosis not present

## 2022-03-26 DIAGNOSIS — R7303 Prediabetes: Secondary | ICD-10-CM | POA: Diagnosis not present

## 2022-03-26 DIAGNOSIS — F1010 Alcohol abuse, uncomplicated: Secondary | ICD-10-CM

## 2022-03-26 DIAGNOSIS — Z9181 History of falling: Secondary | ICD-10-CM | POA: Diagnosis not present

## 2022-03-26 DIAGNOSIS — F0280 Dementia in other diseases classified elsewhere without behavioral disturbance: Secondary | ICD-10-CM

## 2022-03-26 DIAGNOSIS — E785 Hyperlipidemia, unspecified: Secondary | ICD-10-CM

## 2022-03-26 DIAGNOSIS — D539 Nutritional anemia, unspecified: Secondary | ICD-10-CM

## 2022-03-26 DIAGNOSIS — E538 Deficiency of other specified B group vitamins: Secondary | ICD-10-CM | POA: Diagnosis not present

## 2022-03-26 DIAGNOSIS — R1310 Dysphagia, unspecified: Secondary | ICD-10-CM

## 2022-03-26 DIAGNOSIS — E876 Hypokalemia: Secondary | ICD-10-CM | POA: Diagnosis not present

## 2022-03-26 DIAGNOSIS — I10 Essential (primary) hypertension: Secondary | ICD-10-CM

## 2022-03-26 DIAGNOSIS — Z87891 Personal history of nicotine dependence: Secondary | ICD-10-CM | POA: Diagnosis not present

## 2022-03-26 DIAGNOSIS — K7030 Alcoholic cirrhosis of liver without ascites: Secondary | ICD-10-CM

## 2022-03-27 LAB — CMP14+EGFR
ALT: 15 IU/L (ref 0–32)
AST: 28 IU/L (ref 0–40)
Albumin/Globulin Ratio: 0.9 — ABNORMAL LOW (ref 1.2–2.2)
Albumin: 2.8 g/dL — ABNORMAL LOW (ref 3.8–4.8)
Alkaline Phosphatase: 117 IU/L (ref 44–121)
BUN/Creatinine Ratio: 8 — ABNORMAL LOW (ref 12–28)
BUN: 6 mg/dL — ABNORMAL LOW (ref 8–27)
Bilirubin Total: 0.4 mg/dL (ref 0.0–1.2)
CO2: 22 mmol/L (ref 20–29)
Calcium: 8.5 mg/dL — ABNORMAL LOW (ref 8.7–10.3)
Chloride: 104 mmol/L (ref 96–106)
Creatinine, Ser: 0.78 mg/dL (ref 0.57–1.00)
Globulin, Total: 3 g/dL (ref 1.5–4.5)
Glucose: 99 mg/dL (ref 70–99)
Potassium: 4.3 mmol/L (ref 3.5–5.2)
Sodium: 138 mmol/L (ref 134–144)
Total Protein: 5.8 g/dL — ABNORMAL LOW (ref 6.0–8.5)
eGFR: 80 mL/min/{1.73_m2} (ref >59)

## 2022-03-27 LAB — ANEMIA PROFILE B
Basophils Absolute: 0.2 10*3/uL (ref 0.0–0.2)
Basos: 2 %
EOS (ABSOLUTE): 0.6 10*3/uL — ABNORMAL HIGH (ref 0.0–0.4)
Eos: 6 %
Ferritin: 1194 ng/mL — ABNORMAL HIGH (ref 15–150)
Folate: >20 ng/mL (ref >3.0)
Hematocrit: 33.1 % — ABNORMAL LOW (ref 34.0–46.6)
Hemoglobin: 10.6 g/dL — ABNORMAL LOW (ref 11.1–15.9)
Immature Grans (Abs): 0 10*3/uL (ref 0.0–0.1)
Immature Granulocytes: 0 %
Iron Saturation: 42 % (ref 15–55)
Iron: 41 ug/dL (ref 27–139)
Lymphocytes Absolute: 3.1 10*3/uL (ref 0.7–3.1)
Lymphs: 33 %
MCH: 30.1 pg (ref 26.6–33.0)
MCHC: 32 g/dL (ref 31.5–35.7)
MCV: 94 fL (ref 79–97)
Monocytes Absolute: 0.6 10*3/uL (ref 0.1–0.9)
Monocytes: 7 %
Neutrophils Absolute: 4.7 10*3/uL (ref 1.4–7.0)
Neutrophils: 52 %
Platelets: 339 10*3/uL (ref 150–450)
RBC: 3.52 x10E6/uL — ABNORMAL LOW (ref 3.77–5.28)
RDW: 12.4 % (ref 11.7–15.4)
Retic Ct Pct: 1.7 % (ref 0.6–2.6)
Total Iron Binding Capacity: 97 ug/dL — CL (ref 250–450)
UIBC: 56 ug/dL — ABNORMAL LOW (ref 118–369)
Vitamin B-12: 1078 pg/mL (ref 232–1245)
WBC: 9.2 10*3/uL (ref 3.4–10.8)

## 2022-03-27 LAB — VITAMIN B1: Thiamine: 179.8 nmol/L (ref 66.5–200.0)

## 2022-03-27 LAB — MAGNESIUM: Magnesium: 1.7 mg/dL (ref 1.6–2.3)

## 2022-03-30 DIAGNOSIS — Z87891 Personal history of nicotine dependence: Secondary | ICD-10-CM | POA: Diagnosis not present

## 2022-03-30 DIAGNOSIS — E538 Deficiency of other specified B group vitamins: Secondary | ICD-10-CM | POA: Diagnosis not present

## 2022-03-30 DIAGNOSIS — K219 Gastro-esophageal reflux disease without esophagitis: Secondary | ICD-10-CM | POA: Diagnosis not present

## 2022-03-30 DIAGNOSIS — E876 Hypokalemia: Secondary | ICD-10-CM | POA: Diagnosis not present

## 2022-03-30 DIAGNOSIS — M109 Gout, unspecified: Secondary | ICD-10-CM | POA: Diagnosis not present

## 2022-03-30 DIAGNOSIS — Z9181 History of falling: Secondary | ICD-10-CM | POA: Diagnosis not present

## 2022-03-30 DIAGNOSIS — I10 Essential (primary) hypertension: Secondary | ICD-10-CM | POA: Diagnosis not present

## 2022-03-30 DIAGNOSIS — R1310 Dysphagia, unspecified: Secondary | ICD-10-CM | POA: Diagnosis not present

## 2022-03-30 DIAGNOSIS — R7303 Prediabetes: Secondary | ICD-10-CM | POA: Diagnosis not present

## 2022-03-30 DIAGNOSIS — R627 Adult failure to thrive: Secondary | ICD-10-CM | POA: Diagnosis not present

## 2022-03-30 DIAGNOSIS — D539 Nutritional anemia, unspecified: Secondary | ICD-10-CM | POA: Diagnosis not present

## 2022-03-30 DIAGNOSIS — E785 Hyperlipidemia, unspecified: Secondary | ICD-10-CM | POA: Diagnosis not present

## 2022-04-01 DIAGNOSIS — E876 Hypokalemia: Secondary | ICD-10-CM | POA: Diagnosis not present

## 2022-04-01 DIAGNOSIS — D539 Nutritional anemia, unspecified: Secondary | ICD-10-CM | POA: Diagnosis not present

## 2022-04-01 DIAGNOSIS — I10 Essential (primary) hypertension: Secondary | ICD-10-CM | POA: Diagnosis not present

## 2022-04-01 DIAGNOSIS — R1310 Dysphagia, unspecified: Secondary | ICD-10-CM | POA: Diagnosis not present

## 2022-04-01 DIAGNOSIS — R7303 Prediabetes: Secondary | ICD-10-CM | POA: Diagnosis not present

## 2022-04-01 DIAGNOSIS — Z87891 Personal history of nicotine dependence: Secondary | ICD-10-CM | POA: Diagnosis not present

## 2022-04-01 DIAGNOSIS — M109 Gout, unspecified: Secondary | ICD-10-CM | POA: Diagnosis not present

## 2022-04-01 DIAGNOSIS — E785 Hyperlipidemia, unspecified: Secondary | ICD-10-CM | POA: Diagnosis not present

## 2022-04-01 DIAGNOSIS — K219 Gastro-esophageal reflux disease without esophagitis: Secondary | ICD-10-CM | POA: Diagnosis not present

## 2022-04-01 DIAGNOSIS — Z9181 History of falling: Secondary | ICD-10-CM | POA: Diagnosis not present

## 2022-04-01 DIAGNOSIS — R627 Adult failure to thrive: Secondary | ICD-10-CM | POA: Diagnosis not present

## 2022-04-01 DIAGNOSIS — E538 Deficiency of other specified B group vitamins: Secondary | ICD-10-CM | POA: Diagnosis not present

## 2022-04-09 DIAGNOSIS — R627 Adult failure to thrive: Secondary | ICD-10-CM | POA: Diagnosis not present

## 2022-04-09 DIAGNOSIS — K219 Gastro-esophageal reflux disease without esophagitis: Secondary | ICD-10-CM | POA: Diagnosis not present

## 2022-04-09 DIAGNOSIS — R1310 Dysphagia, unspecified: Secondary | ICD-10-CM | POA: Diagnosis not present

## 2022-04-09 DIAGNOSIS — R7303 Prediabetes: Secondary | ICD-10-CM | POA: Diagnosis not present

## 2022-04-09 DIAGNOSIS — Z9181 History of falling: Secondary | ICD-10-CM | POA: Diagnosis not present

## 2022-04-09 DIAGNOSIS — E785 Hyperlipidemia, unspecified: Secondary | ICD-10-CM | POA: Diagnosis not present

## 2022-04-09 DIAGNOSIS — E538 Deficiency of other specified B group vitamins: Secondary | ICD-10-CM | POA: Diagnosis not present

## 2022-04-09 DIAGNOSIS — D539 Nutritional anemia, unspecified: Secondary | ICD-10-CM | POA: Diagnosis not present

## 2022-04-09 DIAGNOSIS — E876 Hypokalemia: Secondary | ICD-10-CM | POA: Diagnosis not present

## 2022-04-09 DIAGNOSIS — I10 Essential (primary) hypertension: Secondary | ICD-10-CM | POA: Diagnosis not present

## 2022-04-09 DIAGNOSIS — Z87891 Personal history of nicotine dependence: Secondary | ICD-10-CM | POA: Diagnosis not present

## 2022-04-09 DIAGNOSIS — M109 Gout, unspecified: Secondary | ICD-10-CM | POA: Diagnosis not present

## 2022-04-12 DIAGNOSIS — R627 Adult failure to thrive: Secondary | ICD-10-CM | POA: Diagnosis not present

## 2022-04-12 DIAGNOSIS — E876 Hypokalemia: Secondary | ICD-10-CM | POA: Diagnosis not present

## 2022-04-12 DIAGNOSIS — R1310 Dysphagia, unspecified: Secondary | ICD-10-CM | POA: Diagnosis not present

## 2022-04-12 DIAGNOSIS — K219 Gastro-esophageal reflux disease without esophagitis: Secondary | ICD-10-CM | POA: Diagnosis not present

## 2022-04-12 DIAGNOSIS — I10 Essential (primary) hypertension: Secondary | ICD-10-CM | POA: Diagnosis not present

## 2022-04-12 DIAGNOSIS — M109 Gout, unspecified: Secondary | ICD-10-CM | POA: Diagnosis not present

## 2022-04-12 DIAGNOSIS — Z9181 History of falling: Secondary | ICD-10-CM | POA: Diagnosis not present

## 2022-04-12 DIAGNOSIS — E538 Deficiency of other specified B group vitamins: Secondary | ICD-10-CM | POA: Diagnosis not present

## 2022-04-12 DIAGNOSIS — D539 Nutritional anemia, unspecified: Secondary | ICD-10-CM | POA: Diagnosis not present

## 2022-04-12 DIAGNOSIS — R7303 Prediabetes: Secondary | ICD-10-CM | POA: Diagnosis not present

## 2022-04-12 DIAGNOSIS — Z87891 Personal history of nicotine dependence: Secondary | ICD-10-CM | POA: Diagnosis not present

## 2022-04-12 DIAGNOSIS — E785 Hyperlipidemia, unspecified: Secondary | ICD-10-CM | POA: Diagnosis not present

## 2022-04-16 DIAGNOSIS — E785 Hyperlipidemia, unspecified: Secondary | ICD-10-CM | POA: Diagnosis not present

## 2022-04-16 DIAGNOSIS — E538 Deficiency of other specified B group vitamins: Secondary | ICD-10-CM | POA: Diagnosis not present

## 2022-04-16 DIAGNOSIS — E876 Hypokalemia: Secondary | ICD-10-CM | POA: Diagnosis not present

## 2022-04-16 DIAGNOSIS — K219 Gastro-esophageal reflux disease without esophagitis: Secondary | ICD-10-CM | POA: Diagnosis not present

## 2022-04-16 DIAGNOSIS — Z9181 History of falling: Secondary | ICD-10-CM | POA: Diagnosis not present

## 2022-04-16 DIAGNOSIS — R627 Adult failure to thrive: Secondary | ICD-10-CM | POA: Diagnosis not present

## 2022-04-16 DIAGNOSIS — M109 Gout, unspecified: Secondary | ICD-10-CM | POA: Diagnosis not present

## 2022-04-16 DIAGNOSIS — R1310 Dysphagia, unspecified: Secondary | ICD-10-CM | POA: Diagnosis not present

## 2022-04-16 DIAGNOSIS — Z87891 Personal history of nicotine dependence: Secondary | ICD-10-CM | POA: Diagnosis not present

## 2022-04-16 DIAGNOSIS — D539 Nutritional anemia, unspecified: Secondary | ICD-10-CM | POA: Diagnosis not present

## 2022-04-16 DIAGNOSIS — I10 Essential (primary) hypertension: Secondary | ICD-10-CM | POA: Diagnosis not present

## 2022-04-16 DIAGNOSIS — R7303 Prediabetes: Secondary | ICD-10-CM | POA: Diagnosis not present

## 2022-04-28 IMAGING — CT CT TEMPORAL BONES W/ CM
1 series · 15 of 30 positions shown, 19 images · IV contrast (iopamidol)
Comparison: None.

CLINICAL DATA: Hearing loss with ringing in ears, left worse than
right

EXAM:
CT TEMPORAL BONES WITH CONTRAST
TECHNIQUE: Axial and coronal plane CT imaging of the petrous temporal bones was
performed with thin-collimation image reconstruction after
intravenous contrast administration. Multiplanar CT image
reconstructions were also generated.
CONTRAST:  75mL 1Y37TD-PKK IOPAMIDOL ([id number]-PKK) INJECTION 61%

[Series 4: soft tissue · axial · 0.40mm/px · z∈[-144,-52]mm · 15 of 50 slices shown, 19 images]
[im 2/50  brain]
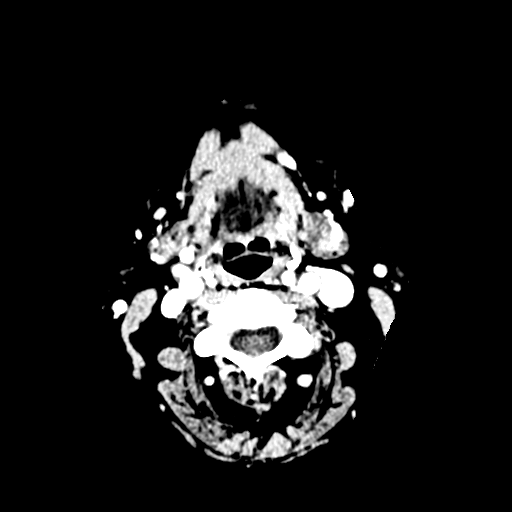
[im 2/50  bone]
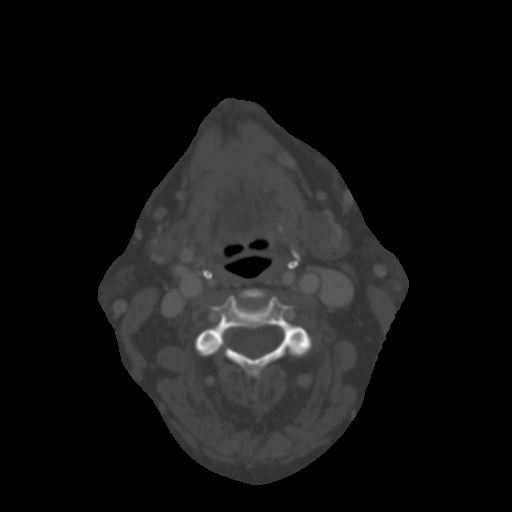
[im 6/50  bone]
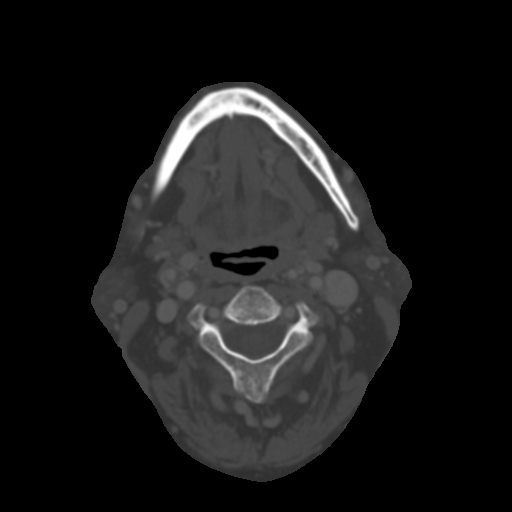
[im 9/50  bone]
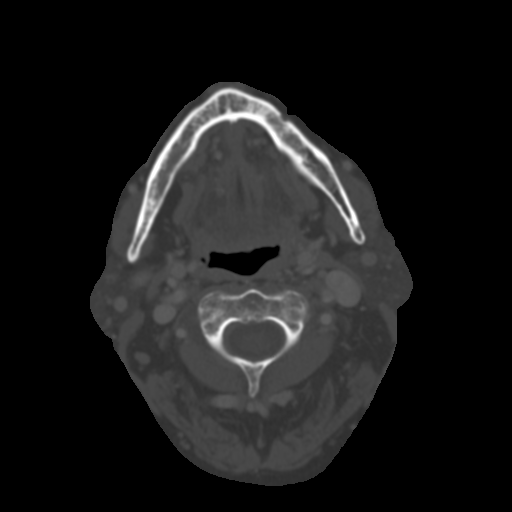
[im 12/50  bone]
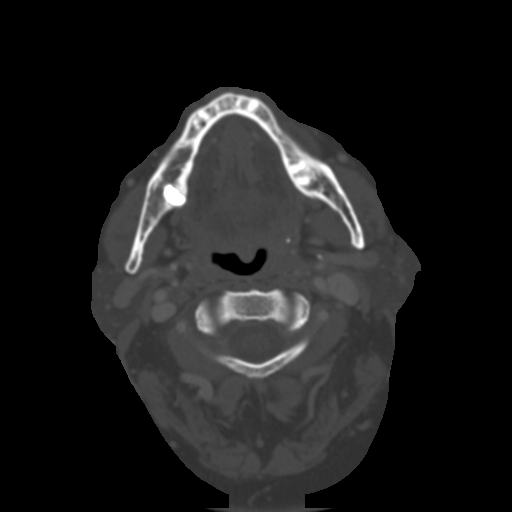
[im 16/50  brain]
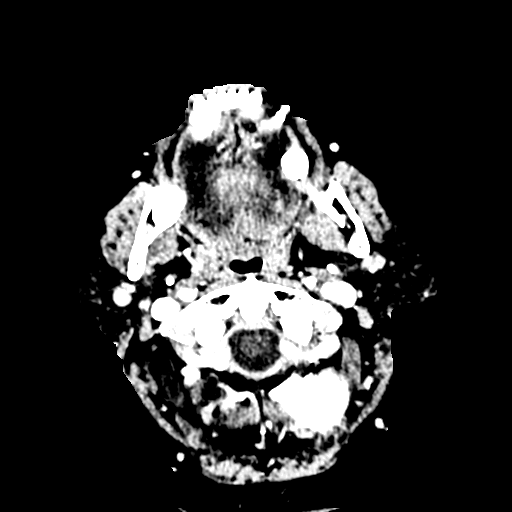
[im 16/50  bone]
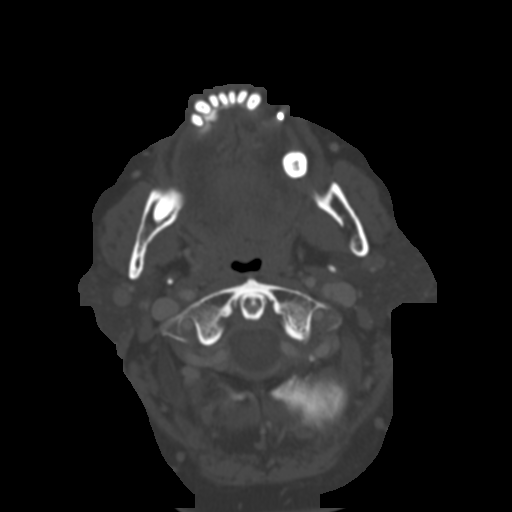
[im 19/50  bone]
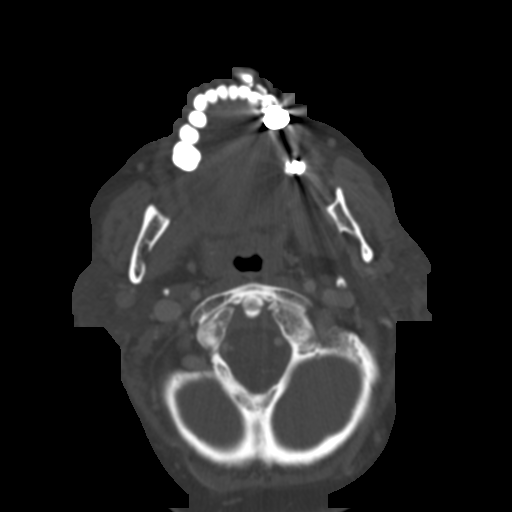
[im 22/50  bone]
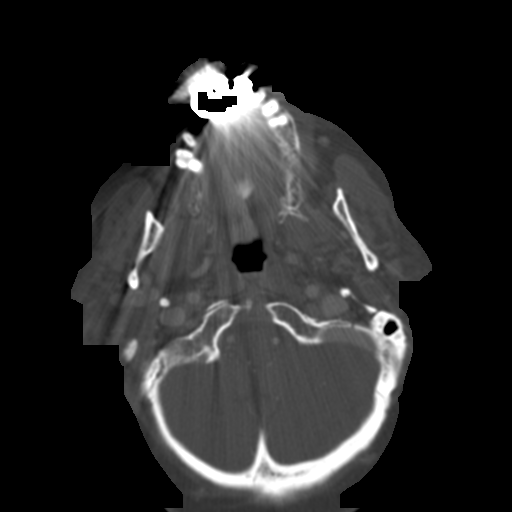
[im 26/50  bone]
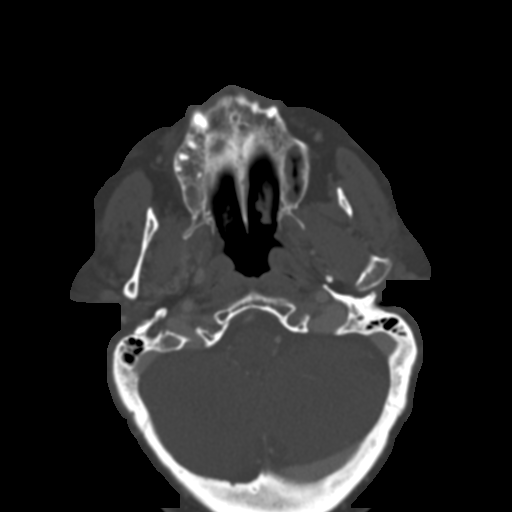
[im 28/50  brain]
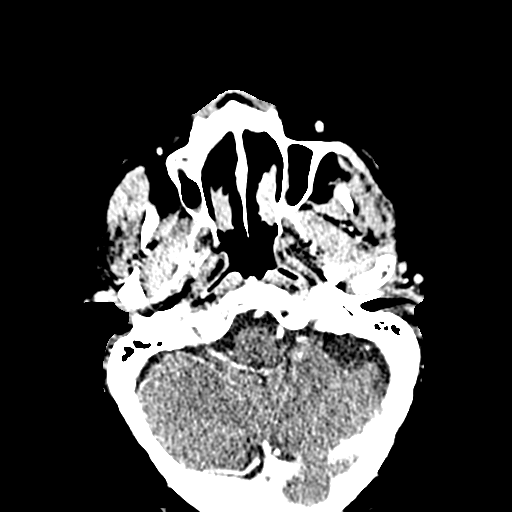
[im 28/50  bone]
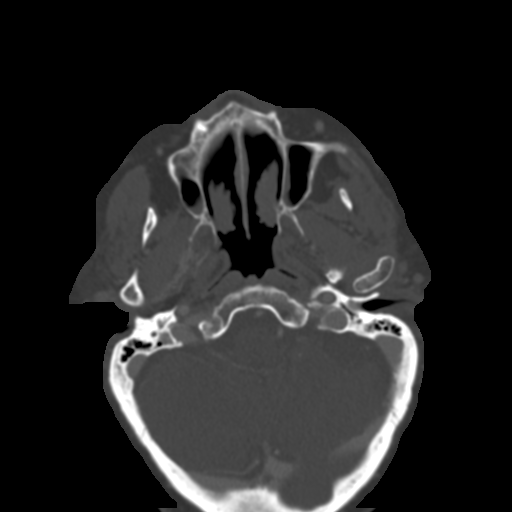
[im 31/50  bone]
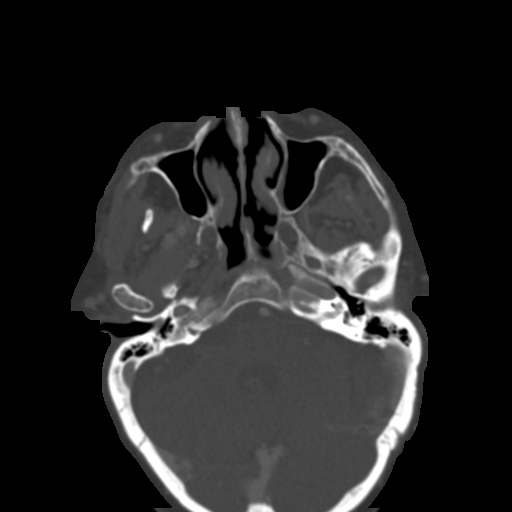
[im 34/50  bone]
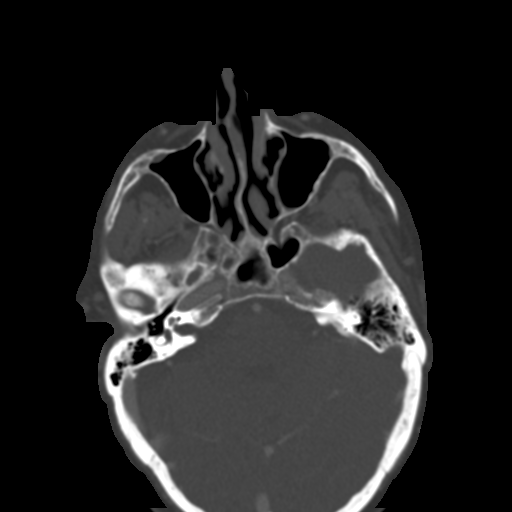
[im 38/50  bone]
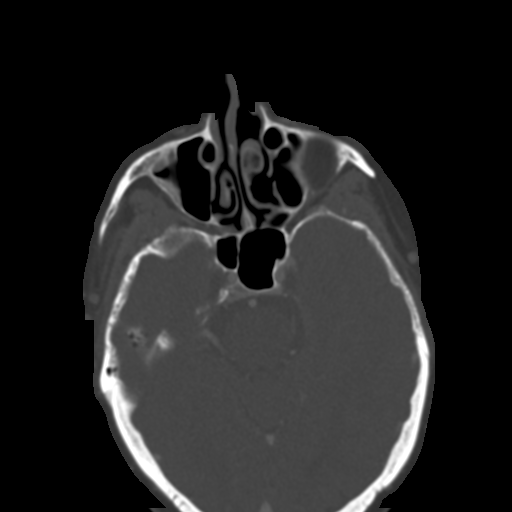
[im 41/50  brain]
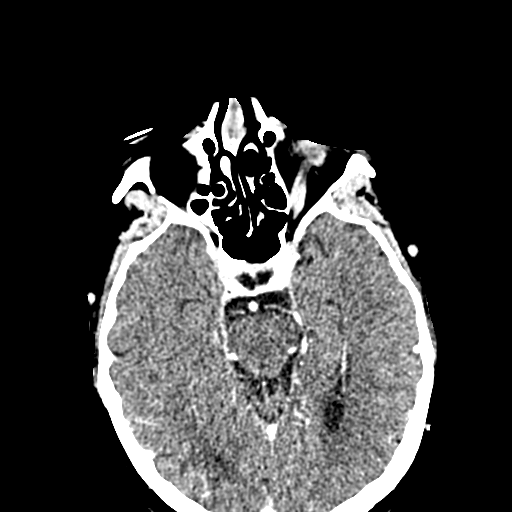
[im 41/50  bone]
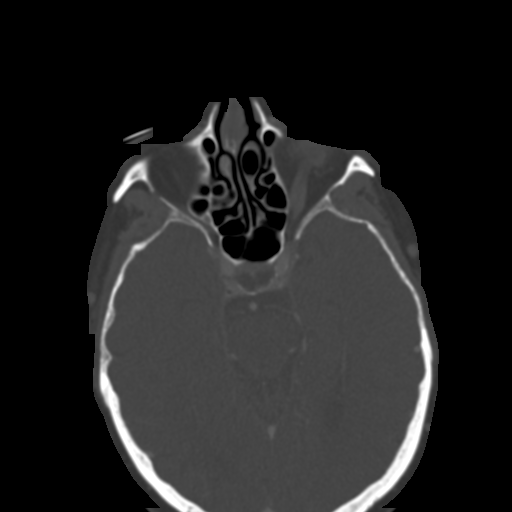
[im 44/50  bone]
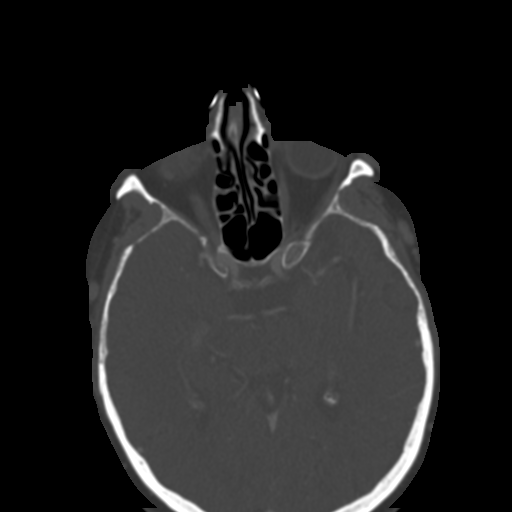
[im 48/50  bone]
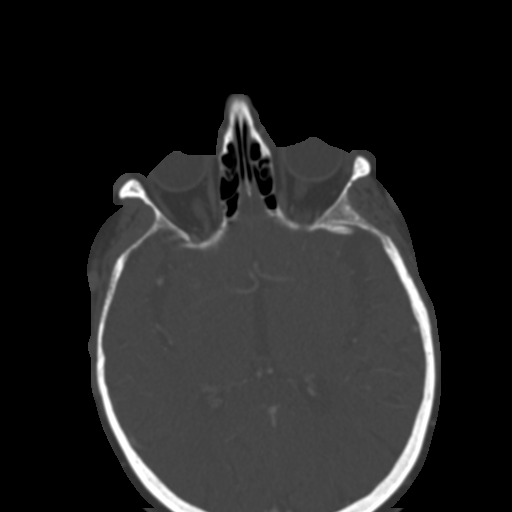

[15 of 30 positions shown; findings below may reference images not displayed]

FINDINGS: Right temporal bone:

External auditory canal is unremarkable. There is thickening of the
tympanic membrane. Scutum is sharp. Middle ear cleft and ossicles
are unremarkable. Cochlea and semicircular canals are unremarkable.
Tegmen is intact. Facial nerve demonstrates normal course. Mastoid
air cells are clear.

Left temporal bone:

Circumferential soft tissue thickening along the external auditory
canal. Thickening of the tympanic membrane. Scutum is sharp. Middle
ear cleft and ossicles are unremarkable. Cochlea and semicircular
canals are unremarkable. Tegmen is intact. Facial nerve demonstrates
normal course. Mastoid air cells are clear.

Limited intracranial imaging demonstrates no abnormal enhancement.
Mild paranasal sinus mucosal thickening. Included facial soft
tissues are unremarkable.
IMPRESSION: Thickening of bilateral tympanic membranes. Nonspecific left
external auditory canal circumferential soft tissue thickening. No
erosive changes.

## 2022-05-27 ENCOUNTER — Other Ambulatory Visit: Payer: Self-pay | Admitting: *Deleted

## 2022-05-27 DIAGNOSIS — F1010 Alcohol abuse, uncomplicated: Secondary | ICD-10-CM

## 2022-05-27 MED ORDER — FOLIC ACID 400 MCG PO TABS: 400.0000 ug | ORAL_TABLET | Freq: Every day | ORAL | 1 refills | Status: AC

## 2022-05-27 MED ORDER — THIAMINE HCL 100 MG PO TABS: 100.0000 mg | ORAL_TABLET | Freq: Every day | ORAL | 1 refills | Status: AC

## 2022-05-27 MED ORDER — VITAMIN C-ROSE HIPS 500 MG PO TABS: 500.0000 mg | ORAL_TABLET | Freq: Every day | ORAL | 1 refills | Status: AC

## 2022-05-27 MED ORDER — CHELATED ZINC 50 MG PO TABS: 50.0000 mg | ORAL_TABLET | Freq: Every day | ORAL | 1 refills | Status: AC

## 2022-05-27 MED ORDER — MAGNESIUM OXIDE 400 MG PO TABS: 400.0000 mg | ORAL_TABLET | Freq: Every day | ORAL | 1 refills | Status: AC

## 2022-05-27 NOTE — Telephone Encounter (Signed)
Fax & call from [place] requesting RFs given by NP from SNF in [facility] Pharmacist informed her that these were OTC's, pt requested they be sent to her PCP

## 2022-05-31 ENCOUNTER — Encounter: Payer: Self-pay | Admitting: Family Medicine

## 2022-05-31 ENCOUNTER — Ambulatory Visit (INDEPENDENT_AMBULATORY_CARE_PROVIDER_SITE_OTHER): Payer: Medicare Other | Admitting: Family Medicine

## 2022-05-31 VITALS — BP 146/86 | HR 75 | Ht 60.0 in | Wt 110.0 lb

## 2022-05-31 DIAGNOSIS — R6889 Other general symptoms and signs: Secondary | ICD-10-CM | POA: Diagnosis not present

## 2022-05-31 DIAGNOSIS — R739 Hyperglycemia, unspecified: Secondary | ICD-10-CM | POA: Diagnosis not present

## 2022-05-31 DIAGNOSIS — G629 Polyneuropathy, unspecified: Secondary | ICD-10-CM | POA: Diagnosis not present

## 2022-05-31 LAB — BAYER DCA HB A1C WAIVED: HB A1C (BAYER DCA - WAIVED): 5 % (ref 4.8–5.6)

## 2022-05-31 NOTE — Progress Notes (Signed)
BP (!) 146/86   Pulse 75   Ht 5' (1.524 m)   Wt 110 lb (49.9 kg)   SpO2 98%   BMI 21.48 kg/m    Subjective:   Patient ID: Maria Dunlap, female    DOB: March 25, 1951, 72 y.o.   MRN: [id number]  HPI: Maria Dunlap is a 72 y.o. female presenting on 05/31/2022 for Foot Pain (Bilateral, present for months)   HPI Patient is coming in with fluids and altered sensation in her feet.  She feels like the balls of her feet.  She feels like it is swollen there but is not actually swollen, she feels like it is numb as well.  She says it has been going on for a few months and does not really know how to explain it.  It is not necessarily painful and there are no skin changes and it is on both feet under the balls of her feet near her toes.  Relevant past medical, surgical, family and social history reviewed and updated as indicated. Interim medical history since our last visit reviewed. Allergies and medications reviewed and updated.  Review of Systems  Constitutional:  Negative for chills and fever.  Eyes:  Negative for visual disturbance.  Respiratory:  Negative for chest tightness and shortness of breath.   Cardiovascular:  Negative for chest pain and leg swelling.  Musculoskeletal:  Negative for arthralgias.  Skin:  Negative for rash.  Neurological:  Positive for numbness. Negative for dizziness, weakness, light-headedness and headaches.  Psychiatric/Behavioral:  Negative for agitation and behavioral problems.   All other systems reviewed and are negative.   Per HPI unless specifically indicated above   Allergies as of 05/31/2022       Reactions   Amoxicillin Swelling   Neck swell Throat swelling   Allopurinol Other (See Comments)   Elevated LFT's Elevated LFT's   Macrolides And Ketolides    Mycinette [phenol]    Nitrofurantoin Other (See Comments)   Simvastatin Other (See Comments)   Elevated LFT's Elevated LFT's   Rosuvastatin Rash   dizziness   Triamcinolone Rash,  Other (See Comments)   Triamcinolone Acetonide Rash        Medication List        Accurate as of May 31, 2022  9:59 AM. If you have any questions, ask your nurse or doctor.          Chelated Zinc 50 MG Tabs Take 1 tablet (50 mg total) by mouth daily.   cyanocobalamin 500 MCG tablet Commonly known as: VITAMIN B12 Take 500 mcg by mouth every other day.   famotidine 20 MG tablet Commonly known as: PEPCID Take 1 tablet (20 mg total) by mouth daily.   folic acid 409 MCG tablet Commonly known as: FOLVITE Take 1 tablet (400 mcg total) by mouth daily.   magnesium oxide 400 MG tablet Commonly known as: MAG-OX Take 1 tablet (400 mg total) by mouth daily.   ondansetron 4 MG tablet Commonly known as: Zofran Take 1 tablet (4 mg total) by mouth every 8 (eight) hours as needed for nausea or vomiting.   oxyCODONE 5 MG immediate release tablet Commonly known as: Oxy IR/ROXICODONE Take 1 tablet (5 mg total) by mouth every 6 (six) hours as needed for severe pain.   potassium chloride SA 20 MEQ tablet Commonly known as: KLOR-CON M Take 1 tablet (20 mEq total) by mouth daily.   thiamine 100 MG tablet Commonly known as: VITAMIN B1 Take 1 tablet (  100 mg total) by mouth daily.   vitamin C with rose hips 500 MG tablet Take 1 tablet (500 mg total) by mouth daily.         Objective:   BP (!) 146/86   Pulse 75   Ht 5' (1.524 m)   Wt 110 lb (49.9 kg)   SpO2 98%   BMI 21.48 kg/m   Wt Readings from Last 3 Encounters:  05/31/22 110 lb (49.9 kg)  03/25/22 113 lb 12.8 oz (51.6 kg)  03/14/22 113 lb 8 oz (51.5 kg)    Physical Exam Vitals and nursing note reviewed.  Constitutional:      Appearance: Normal appearance.  Skin:    General: Skin is warm and dry.     Findings: No lesion or rash.  Neurological:     Mental Status: She is alert.     Motor: No weakness or abnormal muscle tone.     Gait: Gait normal.     Comments: Sensation of numbness near her second and third  and fourth toes on both feet under the balls of her feet.  No swelling or pain or lack of range of motion noted.       Assessment & Plan:   Problem List Items Addressed This Visit   None Visit Diagnoses     Neuropathy    -  Primary   Relevant Orders   Bayer DCA Hb A1c Waived   TSH   Vitamin B12     Will test some blood work for neuropathy.  Follow up plan: Return if symptoms worsen or fail to improve.  Counseling provided for all of the vaccine components Orders Placed This Encounter  Procedures   Bayer DCA Hb A1c Waived   TSH   Vitamin B12    Arville Care, MD [facility] 05/31/2022, 9:59 AM

## 2022-06-01 LAB — VITAMIN B12: Vitamin B-12: 339 pg/mL (ref 232–1245)

## 2022-06-01 LAB — TSH: TSH: 1.83 u[IU]/mL (ref 0.450–4.500)

## 2022-06-03 ENCOUNTER — Other Ambulatory Visit: Payer: Self-pay | Admitting: Family Medicine

## 2022-06-03 DIAGNOSIS — R410 Disorientation, unspecified: Secondary | ICD-10-CM

## 2022-06-03 DIAGNOSIS — G9341 Metabolic encephalopathy: Secondary | ICD-10-CM

## 2022-06-08 ENCOUNTER — Telehealth: Payer: Self-pay | Admitting: Family Medicine

## 2022-06-08 NOTE — Telephone Encounter (Signed)
  Left message for patient to call back and schedule Medicare Annual Wellness Visit (AWV) to be completed by video or phone.  No hx of AWV eligible for AWVI per [facility] as of 05/24/2017  Please schedule at anytime with [facility]   Any questions, please call me at [phone]   Thank you,   [facility] Clinical Support for [place] [facility] ??[phone] or ??[phone]

## 2022-06-11 DIAGNOSIS — R600 Localized edema: Secondary | ICD-10-CM | POA: Diagnosis not present

## 2022-06-11 DIAGNOSIS — M79662 Pain in left lower leg: Secondary | ICD-10-CM | POA: Diagnosis not present

## 2022-06-15 ENCOUNTER — Encounter: Payer: Self-pay | Admitting: Neurology

## 2022-06-17 ENCOUNTER — Telehealth: Payer: Self-pay | Admitting: Family Medicine

## 2022-06-17 NOTE — Telephone Encounter (Signed)
Returned patients call and answered questions.  Per chart we did not send letter.  Patient aware we are not sure who sent the letter.

## 2022-06-30 ENCOUNTER — Ambulatory Visit: Payer: Medicare Other

## 2022-06-30 NOTE — Progress Notes (Signed)
Initial neurology clinic note  SERVICE DATE: 07/07/22  Reason for Evaluation: Consultation requested by Baruch Gouty, FNP for an opinion regarding pain in neck, back, and legs. My final recommendations will be communicated back to the requesting physician by way of shared medical record or letter to requesting physician via [place] mail.  HPI: This is Ms. Maria Dunlap, a 72 y.o. right-handed female with a medical history of HTN, HLD, prediabetes, gout, EtOH abuse c/b cirrhosis and metabolic encephalopathy, GERD, former smoker who presents to neurology clinic with the chief complaint of pain in neck, back, and legs. The patient is accompanied by husband.  Patient first noticed tingling in her feet about 1.5 months ago per patient. She was seen by a local provider and urgent care but nothing was found per patient. Patient requested referral to neurology at urgent care. The tingling has gotten worse, now in her lower legs. She also endorses numbness. She has not had any recent falls.  She also has back pain for years. It has gotten worse over time. She denies radiating pain into her legs. She had a spinal injection maybe 20 years ago. Patient endorses neck and shoulder pain as well. She is not sure when this started, but be present for years.  She has been hospitalized multiple times including rehab. 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 She has lost weight and strength due to multiple hospitalizations. She was last hospitalized in 02/2022. She had home PT until 03/2022. Patient takes B1, 123456, folic acid, magnesium oxide, and zinc.  She endorses bruising all over.  Of note, she had prolonged QT in the past.  The patient denies symptoms suggestive of oculobulbar weakness including diplopia, ptosis, dysphagia, poor saliva control, dysarthria/dysphonia, impaired mastication, facial weakness/droop.  There are no neuromuscular respiratory weakness symptoms,  particularly orthopnea>dyspnea.   She report any constitutional symptoms like fever, night sweats, anorexia or unintentional weight loss.  EtOH use: currently drinks 1-2 beers daily, she previously drank much more (summer 2023) Restrictive diet? No   MEDICATIONS:  Outpatient Encounter Medications as of 07/07/2022  Medication Sig   Ascorbic Acid (VITAMIN C WITH ROSE HIPS) 500 MG tablet Take 1 tablet (500 mg total) by mouth daily.   Chelated Zinc 50 MG TABS Take 1 tablet (50 mg total) by mouth daily.   cyanocobalamin (VITAMIN B12) 500 MCG tablet Take 500 mcg by mouth every other day.   famotidine (PEPCID) 20 MG tablet Take 1 tablet (20 mg total) by mouth daily.   folic acid (FOLVITE) A999333 MCG tablet Take 1 tablet (400 mcg total) by mouth daily.   magnesium oxide (MAG-OX) 400 MG tablet Take 1 tablet (400 mg total) by mouth daily.   thiamine (VITAMIN B1) 100 MG tablet Take 1 tablet (100 mg total) by mouth daily.   ondansetron (ZOFRAN) 4 MG tablet Take 1 tablet (4 mg total) by mouth every 8 (eight) hours as needed for nausea or vomiting. (Patient not taking: Reported on 07/07/2022)   potassium chloride SA (KLOR-CON M) 20 MEQ tablet Take 1 tablet (20 mEq total) by mouth daily. (Patient not taking: Reported on 07/07/2022)   [DISCONTINUED] oxyCODONE (OXY IR/ROXICODONE) 5 MG immediate release tablet Take 1 tablet (5 mg total) by mouth every 6 (six) hours as needed for severe pain. (Patient not taking: Reported on 07/07/2022)   No facility-administered encounter medications on file as of 07/07/2022.    PAST MEDICAL HISTORY: Past Medical History:  Diagnosis Date  Alcohol abuse    Dysphagia    Eczema    Essential hypertension    GERD (gastroesophageal reflux disease)    Gout    Hyperlipidemia    Liver cirrhosis (HCC)    Menopause    Metabolic encephalopathy    Prediabetes    Statin intolerance     PAST SURGICAL HISTORY: Past Surgical History:  Procedure Laterality Date   ABDOMINAL  HYSTERECTOMY     ABDOMINAL SURGERY     CHOLECYSTECTOMY N/A 03/14/2022   Procedure: LAPAROSCOPIC CHOLECYSTECTOMY;  Surgeon: Georganna Skeans, MD;  Location: [facility];  Service: General;  Laterality: N/A;   ENDOSCOPIC RETROGRADE CHOLANGIOPANCREATOGRAPHY (ERCP) WITH PROPOFOL N/A 03/12/2022   Procedure: ENDOSCOPIC RETROGRADE CHOLANGIOPANCREATOGRAPHY (ERCP) WITH PROPOFOL;  Surgeon: Ladene Artist, MD;  Location: [facility];  Service: Gastroenterology;  Laterality: N/A;   REMOVAL OF STONES  03/12/2022   Procedure: REMOVAL OF STONES;  Surgeon: Ladene Artist, MD;  Location: [facility];  Service: Gastroenterology;;   Joan Mayans  03/12/2022   Procedure: Joan Mayans;  Surgeon: Ladene Artist, MD;  Location: [facility] ENDOSCOPY;  Service: Gastroenterology;;    ALLERGIES: Allergies  Allergen Reactions   Amoxicillin Swelling    Neck swell Throat swelling   Allopurinol Other (See Comments)    Elevated LFT's Elevated LFT's   Macrolides And Ketolides    Mycinette [Phenol]    Nitrofurantoin Other (See Comments)   Simvastatin Other (See Comments)    Elevated LFT's  Elevated LFT's   Rosuvastatin Rash    dizziness   Triamcinolone Rash and Other (See Comments)   Triamcinolone Acetonide Rash    FAMILY HISTORY: Family History  Problem Relation Age of Onset   Hypertension Mother    Heart disease Mother    Stroke Father    Heart disease Father    Cancer Brother    COPD Brother    Prostate cancer Brother     SOCIAL HISTORY: Social History   Tobacco Use   Smoking status: Former    Types: Cigarettes    Quit date: 05/24/1992    Years since quitting: 30.1  Vaping Use   Vaping Use: Never used  Substance Use Topics   Alcohol use: Yes    Comment: Occasional   Drug use: No   Social History   Social History Narrative   Are you right handed or left handed? Right   Are you currently employed ? no   What is your current occupation?retired   Do you live at home alone?husband   Who  lives with you?    What type of home do you live in: 1 story or 2 story? two   Caffeine Occ      OBJECTIVE: PHYSICAL EXAM: BP (!) 163/95   Pulse 73   Ht 4' 9"$  (1.448 m)   Wt 114 lb 9.6 oz (52 kg)   SpO2 99%   BMI 24.80 kg/m   General: General appearance: Awake and alert. No distress. Cooperative with exam.  HEENT: Atraumatic. Anicteric. Lungs: Non-labored breathing on room air  Extremities: No edema. No obvious deformity.  Psych: Affect appropriate.  Neurological: Mental Status: Alert. Speech fluent. No pseudobulbar affect Cranial Nerves: CNII: No RAPD. Visual fields grossly intact. CNIII, IV, VI: PERRL. No nystagmus. EOMI. CN V: Facial sensation intact bilaterally to fine touch.  CN VII: Facial muscles symmetric and strong. No ptosis at rest. CN VIII: Hearing grossly intact bilaterally. CN IX: No hypophonia. CN X: Palate elevates symmetrically. CN XI: Full strength shoulder shrug bilaterally. CN  XII: Tongue protrusion full and midline. No atrophy or fasciculations. No significant dysarthria Motor: Tone is normal. No atrophy.  Individual muscle group testing (MRC grade out of 5):  Movement     Neck flexion 5    Neck extension 5     Right Left   Shoulder abduction 5 5   Shoulder adduction 5 5   Shoulder ext rotation 5 5   Shoulder int rotation 5 5   Elbow flexion 5 5   Elbow extension 5 5   Wrist extension 5 5   Wrist flexion 5 5   Finger abduction - FDI 5 5   Finger abduction - ADM 5 5   Finger extension 5 5   Finger distal flexion - 2/3 5 5   $ Finger distal flexion - 4/5 5 5   $ Thumb flexion - FPL 5 5   Thumb abduction - APB 5 5    Hip flexion 5 5   Hip extension 5 5   Hip adduction 5 5   Hip abduction 5 5   Knee extension 5 5   Knee flexion 5 5   Dorsiflexion 5 5   Plantarflexion 5 5   Inversion 5 5   Eversion 5 5   Great toe extension 5 5   Great toe flexion 5 5     Reflexes:  Right Left   Bicep 2+ 2+   Tricep 2+ 2+   BrRad 2+ 2+    Knee 2+ 2+   Ankle 0 0    Pathological Reflexes: Babinski: flexor response bilaterally Hoffman: absent bilaterally Troemner: absent bilaterally Sensation: Pinprick: Diminished in bilateral feet increasing sensation as going more proximal Vibration: Absent in bilateral great toes, diminished at bilateral ankles, present at bilateral patella Proprioception: Intact in bilateral great toes Coordination: Intact finger-to- nose-finger bilaterally. Romberg with moderate sway. Gait: Narrow based, antalgic gait.  Lab and Test Review: Internal labs: 05/31/22: B12: 339 TSH: 1.830 HbA1c: 5.0  03/25/22: B1: 179.8 CMP significant for albumin of 2.8  Imaging: MRI brain wo contrast (01/22/22): FINDINGS: Brain: Diffuse prominence of the CSF containing spaces compatible generalized cerebral atrophy. No significant cerebral white matter disease for age. No evidence for acute or subacute infarct. Gray-white matter differentiation maintained. No areas of chronic cortical infarction. No acute intracranial hemorrhage. Single punctate chronic microhemorrhage noted within the right cerebellum, of doubtful significance in isolation.   No mass lesion, midline shift or mass effect. No hydrocephalus or extra-axial fluid collection. Pituitary gland and suprasellar region within normal limits.   Vascular: Major intracranial vascular flow voids are maintained.   Skull and upper cervical spine: Craniocervical junction within normal limits. Bone marrow signal intensity normal. No scalp soft tissue abnormality.   Sinuses/Orbits: Globes orbital soft tissues demonstrate no acute finding. Paranasal sinuses are largely clear. Trace left mastoid effusion noted, of doubtful significance.   Other: None.   IMPRESSION: 1. No acute intracranial abnormality. 2. Mildly advanced cerebral atrophy. Otherwise unremarkable brain MRI for age.  CT head and cervical spine (02/08/22): FINDINGS: CT HEAD FINDINGS    Brain: No evidence of acute infarction, hemorrhage, extra-axial collection, ventriculomegaly, or mass effect. Generalized cerebral atrophy. Periventricular white matter low attenuation likely secondary to microangiopathy.   Vascular: Cerebrovascular atherosclerotic calcifications are noted. No hyperdense vessels.   Skull: Negative for fracture or focal lesion.   Sinuses/Orbits: Visualized portions of the orbits are unremarkable. Visualized portions of the paranasal sinuses are unremarkable. Visualized portions of the mastoid air cells are unremarkable.  Other: None.   CT CERVICAL SPINE FINDINGS   Alignment: Loss of the normal cervical lordosis with straightening. Minimal anterolisthesis of C3 on C4 and C4 on C5. Minimal retrolisthesis of C5 on C6.   Skull base and vertebrae: No acute fracture. No primary bone lesion or focal pathologic process.   Soft tissues and spinal canal: No prevertebral fluid or swelling. No visible canal hematoma.   Disc levels: Degenerative disease with disc height loss at C4-5, C5-6 and C6-7. At C3-4 the there is mild bilateral facet arthropathy. At C4-5 there is mild bilateral facet arthropathy and mild right foraminal stenosis. At C5-6 there is a broad-based disc osteophyte complex, bilateral uncovertebral degenerative changes, bilateral foraminal stenosis and bilateral facet arthropathy. At C6-7 there is a broad-based disc osteophyte complex.   Upper chest: Lung apices are clear.   Other: No fluid collection or hematoma.   IMPRESSION: 1. No acute intracranial pathology. 2.  No acute osseous injury of the cervical spine. 3. Cervical spine spondylosis as described above.  Lumbar xray (08/23/12): Findings:  Frontal and lateral views were obtained.  There are five  non-rib bearing lumbar type vertebral bodies.  There is mild  dextrorotoscoliosis.  There is no fracture or spondylolisthesis.  There  is moderately severe disc space narrowing at  L3-4 and L4-5.  There is mild disc space narrowing at L1-2 and L2-3. No erosive  change.   IMPRESSION:  Osteoarthritic change.  Mild scoliosis.  No fracture or  spondylolisthesis.   ASSESSMENT: JOSHLYNN MOOS is a 73 y.o. female who presents for evaluation of numbness and tingling in bilateral lower extremities. She has a relevant medical history of HTN, HLD, prediabetes, gout, EtOH abuse c/b cirrhosis and metabolic encephalopathy, GERD, former smoker. Her neurological examination is pertinent for diminished sensation in a length dependent fashion in bilateral lower extremities. 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 I will send labwork to complete work up for treatable causes. We discussed the importance of stopping EtOH or that neuropathy would continue to worsen.   Of note, patient's BP was elevated today. She does not take BP medications. I advised that she check BP at home and contact PCP if still elevated.  PLAN: -Blood work: IFE -B12 1000 mcg daily -Discussed EtOH and affect on neuropathy. Advised to stop drinking. -Lidocaine cream PRN  -Return to clinic as needed  The impression above as well as the plan as outlined below were extensively discussed with the patient (in the company of husband) who voiced understanding. All questions were answered to their satisfaction.  The patient was counseled on pertinent fall precautions per the printed material provided today, and as noted under the "Patient Instructions" section below.  When available, results of the above investigations and possible further recommendations will be communicated to the patient via telephone/MyChart. Patient to call office if not contacted after expected testing turnaround time.   Total time spent reviewing records, interview, history/exam,  documentation, and coordination of care on day of encounter:  50 min   Thank you for allowing me to participate in patient's care.  If I can answer any additional questions, I would be pleased to do so.  Kai Levins, MD   CC: [address], [place] [address]  CC: Referring provider: [place], [address], [address],  [place] [address]

## 2022-07-01 ENCOUNTER — Encounter: Payer: Self-pay | Admitting: Family Medicine

## 2022-07-05 ENCOUNTER — Ambulatory Visit: Payer: Medicare Other

## 2022-07-07 ENCOUNTER — Other Ambulatory Visit (INDEPENDENT_AMBULATORY_CARE_PROVIDER_SITE_OTHER): Payer: Medicare Other

## 2022-07-07 ENCOUNTER — Encounter: Payer: Self-pay | Admitting: Neurology

## 2022-07-07 ENCOUNTER — Ambulatory Visit: Payer: Medicare Other | Admitting: Neurology

## 2022-07-07 VITALS — BP 140/82 | HR 73 | Ht <= 58 in | Wt 114.6 lb

## 2022-07-07 DIAGNOSIS — R200 Anesthesia of skin: Secondary | ICD-10-CM

## 2022-07-07 DIAGNOSIS — R202 Paresthesia of skin: Secondary | ICD-10-CM

## 2022-07-07 DIAGNOSIS — G621 Alcoholic polyneuropathy: Secondary | ICD-10-CM

## 2022-07-07 DIAGNOSIS — E538 Deficiency of other specified B group vitamins: Secondary | ICD-10-CM | POA: Diagnosis not present

## 2022-07-07 DIAGNOSIS — R209 Unspecified disturbances of skin sensation: Secondary | ICD-10-CM

## 2022-07-07 MED ORDER — VITAMIN B-12 1000 MCG PO TABS: 1000.0000 ug | ORAL_TABLET | Freq: Every day | ORAL | 11 refills | Status: AC

## 2022-07-07 NOTE — Patient Instructions (Addendum)
I saw you for numbness and tingling in your legs. This is likely due to alcohol use and low vitamins.  I would like to get blood work today to make sure there is no other cause.  You can also try Lidocaine cream as needed. Apply wear you have pain, tingling, or burning. Wear gloves to prevent your hands being numb. This can be bought over the counter at any drug store or online.  Take B12 1000 mcg daily.  Cut back and stop alcohol to prevent neuropathy (numbness and tingling) getting worse.  The physicians and staff at [facility] are committed to providing excellent care. You may receive a survey requesting feedback about your experience at our office. We strive to receive "very good" responses to the survey questions. If you feel that your experience would prevent you from giving the office a "very good " response, please contact our office to try to remedy the situation. We may be reached at [phone]. Thank you for taking the time out of your busy day to complete the survey.  Kai Levins, MD [facility]  Preventing Falls at [facility] are common, often dreaded events in the lives of older people. Aside from the obvious injuries and even death that may result, fall can cause wide-ranging consequences including loss of independence, mental decline, decreased activity and mobility. Younger people are also at risk of falling, especially those with chronic illnesses and fatigue.  Ways to reduce risk for falling Examine diet and medications. Warm foods and alcohol dilate blood vessels, which can lead to dizziness when standing. Sleep aids, antidepressants and pain medications can also increase the likelihood of a fall.  Get a vision exam. Poor vision, cataracts and glaucoma increase the chances of falling.  Check foot gear. Shoes should fit snugly and have a sturdy, nonskid sole and a broad, low heel  Participate in a physician-approved exercise program to build and maintain  muscle strength and improve balance and coordination. Programs that use ankle weights or stretch bands are excellent for muscle-strengthening. Water aerobics programs and low-impact Tai Chi programs have also been shown to improve balance and coordination.  Increase vitamin D intake. Vitamin D improves muscle strength and increases the amount of calcium the body is able to absorb and deposit in bones.  How to prevent falls from common hazards Floors - Remove all loose wires, cords, and throw rugs. Minimize clutter. Make sure rugs are anchored and smooth. Keep furniture in its usual place.  Chairs -- Use chairs with straight backs, armrests and firm seats. Add firm cushions to existing pieces to add height.  Bathroom - Install grab bars and non-skid tape in the tub or shower. Use a bathtub transfer bench or a shower chair with a back support Use an elevated toilet seat and/or safety rails to assist standing from a low surface. Do not use towel racks or bathroom tissue holders to help you stand.  Lighting - Make sure halls, stairways, and entrances are well-lit. Install a night light in your bathroom or hallway. Make sure there is a light switch at the top and bottom of the staircase. Turn lights on if you get up in the middle of the night. Make sure lamps or light switches are within reach of the bed if you have to get up during the night.  Kitchen - Install non-skid rubber mats near the sink and stove. Clean spills immediately. Store frequently used utensils, pots, pans between waist and eye level. This helps  prevent reaching and bending. Sit when getting things out of lower cupboards.  Living room/ Bedrooms - Place furniture with wide spaces in between, giving enough room to move around. Establish a route through the living room that gives you something to hold onto as you walk.  Stairs - Make sure treads, rails, and rugs are secure. Install a rail on both sides of the stairs. If stairs are a  threat, it might be helpful to arrange most of your activities on the lower level to reduce the number of times you must climb the stairs.  Entrances and doorways - Install metal handles on the walls adjacent to the doorknobs of all doors to make it more secure as you travel through the doorway.  Tips for maintaining balance Keep at least one hand free at all times. Try using a backpack or fanny pack to hold things rather than carrying them in your hands. Never carry objects in both hands when walking as this interferes with keeping your balance.  Attempt to swing both arms from front to back while walking. This might require a conscious effort if Parkinson's disease has diminished your movement. It will, however, help you to maintain balance and posture, and reduce fatigue.  Consciously lift your feet off of the ground when walking. Shuffling and dragging of the feet is a common culprit in losing your balance.  When trying to navigate turns, use a "U" technique of facing forward and making a wide turn, rather than pivoting sharply.  Try to stand with your feet shoulder-length apart. When your feet are close together for any length of time, you increase your risk of losing your balance and falling.  Do one thing at a time. Don't try to walk and accomplish another task, such as reading or looking around. The decrease in your automatic reflexes complicates motor function, so the less distraction, the better.  Do not wear rubber or gripping soled shoes, they might "catch" on the floor and cause tripping.  Move slowly when changing positions. Use deliberate, concentrated movements and, if needed, use a grab bar or walking aid. Count 15 seconds between each movement. For example, when rising from a seated position, wait 15 seconds after standing to begin walking.  If balance is a continuous problem, you might want to consider a walking aid such as a cane, walking stick, or walker. Once you've  mastered walking with help, you might be ready to try it on your own again.

## 2022-07-13 ENCOUNTER — Telehealth: Payer: Self-pay | Admitting: Family Medicine

## 2022-07-13 NOTE — Telephone Encounter (Signed)
Contacted Maria Dunlap to schedule their annual wellness visit. Patient declined to schedule AWV at this time.  Patient is going to go back to her old doctor in [place]  Thank you,  [place],  [place] ??[phone]

## 2022-07-14 DIAGNOSIS — H6121 Impacted cerumen, right ear: Secondary | ICD-10-CM | POA: Diagnosis not present

## 2022-07-14 DIAGNOSIS — H6123 Impacted cerumen, bilateral: Secondary | ICD-10-CM | POA: Diagnosis not present

## 2022-07-14 LAB — IMMUNOFIXATION ELECTROPHORESIS
IgG (Immunoglobin G), Serum: 1087 mg/dL (ref 600–1540)
IgM, Serum: 276 mg/dL (ref 50–300)
Immunoglobulin A: 289 mg/dL (ref 70–320)

## 2022-08-16 ENCOUNTER — Telehealth: Payer: Self-pay | Admitting: Family Medicine

## 2022-08-16 DIAGNOSIS — Z23 Encounter for immunization: Secondary | ICD-10-CM | POA: Diagnosis not present

## 2022-08-16 DIAGNOSIS — E785 Hyperlipidemia, unspecified: Secondary | ICD-10-CM | POA: Diagnosis not present

## 2022-08-16 DIAGNOSIS — R202 Paresthesia of skin: Secondary | ICD-10-CM | POA: Diagnosis not present

## 2022-08-16 DIAGNOSIS — Z0000 Encounter for general adult medical examination without abnormal findings: Secondary | ICD-10-CM | POA: Diagnosis not present

## 2022-08-16 DIAGNOSIS — Z13220 Encounter for screening for lipoid disorders: Secondary | ICD-10-CM | POA: Diagnosis not present

## 2022-08-16 DIAGNOSIS — R200 Anesthesia of skin: Secondary | ICD-10-CM | POA: Diagnosis not present

## 2022-08-16 DIAGNOSIS — D692 Other nonthrombocytopenic purpura: Secondary | ICD-10-CM | POA: Diagnosis not present

## 2022-08-16 NOTE — Telephone Encounter (Signed)
Pt wants to talk to Rakes about medication list. She says that when she was in the hospital last year her list had been modified. I offered an apt and pt refused. She insisted that I send a message. Please call back. She is aware Rakes is off today.

## 2022-08-17 NOTE — Telephone Encounter (Signed)
Confirmed current medication list with patient.  She is states that she is currently taking just vitamins and not Zofran or potassium.  Pt believes when she was in the hospital some of her prescription medications were removed.  In 02/17/22 visit with Monia Pouch pt has amlodipine, Zetia, Lactulose, Protonix listed as discontinued. Names of these medications given to patient.  Pt does not know what medications she is supposed to be on. States that she is no longer coming to this office because it is too far for her to drive. States that she has a new doctor in [place]. Pt does not know the name of provider or office.  Pt aware to call back if needed.

## 2022-10-01 DIAGNOSIS — E785 Hyperlipidemia, unspecified: Secondary | ICD-10-CM | POA: Diagnosis not present

## 2022-10-01 DIAGNOSIS — E538 Deficiency of other specified B group vitamins: Secondary | ICD-10-CM | POA: Diagnosis not present

## 2022-10-01 DIAGNOSIS — Z789 Other specified health status: Secondary | ICD-10-CM | POA: Diagnosis not present

## 2022-10-01 DIAGNOSIS — I10 Essential (primary) hypertension: Secondary | ICD-10-CM | POA: Diagnosis not present

## 2022-10-01 DIAGNOSIS — R7303 Prediabetes: Secondary | ICD-10-CM | POA: Diagnosis not present

## 2022-10-25 DIAGNOSIS — G629 Polyneuropathy, unspecified: Secondary | ICD-10-CM | POA: Diagnosis not present

## 2022-10-25 DIAGNOSIS — R197 Diarrhea, unspecified: Secondary | ICD-10-CM | POA: Diagnosis not present

## 2022-11-09 DIAGNOSIS — H25811 Combined forms of age-related cataract, right eye: Secondary | ICD-10-CM | POA: Diagnosis not present

## 2022-11-09 DIAGNOSIS — Z01818 Encounter for other preprocedural examination: Secondary | ICD-10-CM | POA: Diagnosis not present

## 2022-11-09 DIAGNOSIS — H25812 Combined forms of age-related cataract, left eye: Secondary | ICD-10-CM | POA: Diagnosis not present

## 2022-11-22 DIAGNOSIS — H25812 Combined forms of age-related cataract, left eye: Secondary | ICD-10-CM | POA: Diagnosis not present

## 2022-11-24 DIAGNOSIS — M792 Neuralgia and neuritis, unspecified: Secondary | ICD-10-CM | POA: Diagnosis not present

## 2022-11-24 DIAGNOSIS — G629 Polyneuropathy, unspecified: Secondary | ICD-10-CM | POA: Diagnosis not present

## 2022-11-24 DIAGNOSIS — R197 Diarrhea, unspecified: Secondary | ICD-10-CM | POA: Diagnosis not present

## 2022-12-24 DIAGNOSIS — T466X5A Adverse effect of antihyperlipidemic and antiarteriosclerotic drugs, initial encounter: Secondary | ICD-10-CM | POA: Diagnosis not present

## 2022-12-24 DIAGNOSIS — R7401 Elevation of levels of liver transaminase levels: Secondary | ICD-10-CM | POA: Diagnosis not present

## 2022-12-24 DIAGNOSIS — D6949 Other primary thrombocytopenia: Secondary | ICD-10-CM | POA: Diagnosis not present

## 2022-12-24 DIAGNOSIS — M792 Neuralgia and neuritis, unspecified: Secondary | ICD-10-CM | POA: Diagnosis not present

## 2023-01-03 DIAGNOSIS — H25811 Combined forms of age-related cataract, right eye: Secondary | ICD-10-CM | POA: Diagnosis not present

## 2023-02-02 DIAGNOSIS — M792 Neuralgia and neuritis, unspecified: Secondary | ICD-10-CM | POA: Diagnosis not present

## 2023-02-02 DIAGNOSIS — I10 Essential (primary) hypertension: Secondary | ICD-10-CM | POA: Diagnosis not present

## 2023-03-24 DIAGNOSIS — M79673 Pain in unspecified foot: Secondary | ICD-10-CM | POA: Diagnosis not present

## 2023-04-25 ENCOUNTER — Telehealth: Payer: Self-pay

## 2023-04-25 NOTE — Patient Outreach (Signed)
Successful call to patient on today regarding preventative mammogram screening. Patient declined at this time and will follow up with PCP at later date.  Baruch Gouty [place]/VBCI  [facility] Assistant-Population Health [phone]

## 2023-05-03 DIAGNOSIS — M79676 Pain in unspecified toe(s): Secondary | ICD-10-CM | POA: Diagnosis not present

## 2023-05-03 DIAGNOSIS — M79673 Pain in unspecified foot: Secondary | ICD-10-CM | POA: Diagnosis not present

## 2023-05-03 DIAGNOSIS — B351 Tinea unguium: Secondary | ICD-10-CM | POA: Diagnosis not present

## 2023-06-07 DIAGNOSIS — R944 Abnormal results of kidney function studies: Secondary | ICD-10-CM | POA: Diagnosis not present

## 2023-06-07 DIAGNOSIS — D6949 Other primary thrombocytopenia: Secondary | ICD-10-CM | POA: Diagnosis not present

## 2023-06-07 DIAGNOSIS — E538 Deficiency of other specified B group vitamins: Secondary | ICD-10-CM | POA: Diagnosis not present

## 2023-06-07 DIAGNOSIS — I10 Essential (primary) hypertension: Secondary | ICD-10-CM | POA: Diagnosis not present

## 2023-06-07 DIAGNOSIS — D510 Vitamin B12 deficiency anemia due to intrinsic factor deficiency: Secondary | ICD-10-CM | POA: Diagnosis not present

## 2023-06-07 DIAGNOSIS — R7401 Elevation of levels of liver transaminase levels: Secondary | ICD-10-CM | POA: Diagnosis not present

## 2023-06-07 DIAGNOSIS — Z23 Encounter for immunization: Secondary | ICD-10-CM | POA: Diagnosis not present

## 2023-06-07 DIAGNOSIS — R7303 Prediabetes: Secondary | ICD-10-CM | POA: Diagnosis not present

## 2023-06-07 DIAGNOSIS — M109 Gout, unspecified: Secondary | ICD-10-CM | POA: Diagnosis not present

## 2023-06-07 DIAGNOSIS — E785 Hyperlipidemia, unspecified: Secondary | ICD-10-CM | POA: Diagnosis not present

## 2023-08-09 DIAGNOSIS — B351 Tinea unguium: Secondary | ICD-10-CM | POA: Diagnosis not present

## 2023-08-09 DIAGNOSIS — M79676 Pain in unspecified toe(s): Secondary | ICD-10-CM | POA: Diagnosis not present

## 2023-10-06 DIAGNOSIS — Z20822 Contact with and (suspected) exposure to covid-19: Secondary | ICD-10-CM | POA: Diagnosis not present

## 2023-10-06 DIAGNOSIS — J209 Acute bronchitis, unspecified: Secondary | ICD-10-CM | POA: Diagnosis not present

## 2023-11-28 DIAGNOSIS — Z0000 Encounter for general adult medical examination without abnormal findings: Secondary | ICD-10-CM | POA: Diagnosis not present

## 2023-11-28 DIAGNOSIS — I10 Essential (primary) hypertension: Secondary | ICD-10-CM | POA: Diagnosis not present

## 2023-11-28 DIAGNOSIS — R944 Abnormal results of kidney function studies: Secondary | ICD-10-CM | POA: Diagnosis not present

## 2023-11-28 DIAGNOSIS — E785 Hyperlipidemia, unspecified: Secondary | ICD-10-CM | POA: Diagnosis not present
